# Patient Record
Sex: Female | Born: 1955 | Race: Black or African American | Hispanic: No | Marital: Single | Smoking: Never smoker
Health system: Southern US, Community
[De-identification: ages and names within clinical notes are randomized; demographics above are authoritative.]

## PROBLEM LIST (undated history)

## (undated) DIAGNOSIS — I639 Cerebral infarction, unspecified: Secondary | ICD-10-CM

## (undated) DIAGNOSIS — I10 Essential (primary) hypertension: Secondary | ICD-10-CM

## (undated) DIAGNOSIS — E119 Type 2 diabetes mellitus without complications: Secondary | ICD-10-CM

## (undated) DIAGNOSIS — K219 Gastro-esophageal reflux disease without esophagitis: Secondary | ICD-10-CM

## (undated) DIAGNOSIS — C801 Malignant (primary) neoplasm, unspecified: Secondary | ICD-10-CM

## (undated) HISTORY — PX: BREAST SURGERY: SHX581

---

## 2011-01-28 DIAGNOSIS — C801 Malignant (primary) neoplasm, unspecified: Secondary | ICD-10-CM

## 2011-01-28 HISTORY — DX: Malignant (primary) neoplasm, unspecified: C80.1

## 2012-11-27 ENCOUNTER — Encounter (HOSPITAL_BASED_OUTPATIENT_CLINIC_OR_DEPARTMENT_OTHER): Payer: Self-pay | Admitting: Emergency Medicine

## 2012-11-27 ENCOUNTER — Emergency Department (HOSPITAL_BASED_OUTPATIENT_CLINIC_OR_DEPARTMENT_OTHER): Payer: Commercial Managed Care - PPO

## 2012-11-27 ENCOUNTER — Emergency Department (HOSPITAL_BASED_OUTPATIENT_CLINIC_OR_DEPARTMENT_OTHER)
Admission: EM | Admit: 2012-11-27 | Discharge: 2012-11-28 | Disposition: A | Discharge status: Home / Self Care | Payer: Commercial Managed Care - PPO | Attending: Emergency Medicine | Admitting: Emergency Medicine

## 2012-11-27 DIAGNOSIS — K219 Gastro-esophageal reflux disease without esophagitis: Secondary | ICD-10-CM | POA: Insufficient documentation

## 2012-11-27 DIAGNOSIS — Z853 Personal history of malignant neoplasm of breast: Secondary | ICD-10-CM | POA: Insufficient documentation

## 2012-11-27 DIAGNOSIS — N390 Urinary tract infection, site not specified: Secondary | ICD-10-CM | POA: Diagnosis present

## 2012-11-27 DIAGNOSIS — Z9221 Personal history of antineoplastic chemotherapy: Secondary | ICD-10-CM | POA: Insufficient documentation

## 2012-11-27 DIAGNOSIS — Z923 Personal history of irradiation: Secondary | ICD-10-CM | POA: Insufficient documentation

## 2012-11-27 DIAGNOSIS — R112 Nausea with vomiting, unspecified: Secondary | ICD-10-CM | POA: Insufficient documentation

## 2012-11-27 DIAGNOSIS — R509 Fever, unspecified: Secondary | ICD-10-CM | POA: Diagnosis present

## 2012-11-27 DIAGNOSIS — Z79899 Other long term (current) drug therapy: Secondary | ICD-10-CM | POA: Insufficient documentation

## 2012-11-27 DIAGNOSIS — E860 Dehydration: Secondary | ICD-10-CM | POA: Diagnosis present

## 2012-11-27 DIAGNOSIS — K5900 Constipation, unspecified: Secondary | ICD-10-CM | POA: Insufficient documentation

## 2012-11-27 DIAGNOSIS — J029 Acute pharyngitis, unspecified: Secondary | ICD-10-CM | POA: Insufficient documentation

## 2012-11-27 DIAGNOSIS — I10 Essential (primary) hypertension: Secondary | ICD-10-CM | POA: Insufficient documentation

## 2012-11-27 HISTORY — DX: Essential (primary) hypertension: I10

## 2012-11-27 HISTORY — DX: Malignant (primary) neoplasm, unspecified: C80.1

## 2012-11-27 HISTORY — DX: Gastro-esophageal reflux disease without esophagitis: K21.9

## 2012-11-27 LAB — URINALYSIS W MICROSCOPIC + REFLEX CULTURE
Nitrite: NEGATIVE
Specific Gravity, Urine: 1.041 — ABNORMAL HIGH (ref 1.005–1.030)
Urobilinogen, UA: 1 mg/dL (ref 0.0–1.0)

## 2012-11-27 LAB — COMPREHENSIVE METABOLIC PANEL
BUN: 15 mg/dL (ref 6–23)
Calcium: 10.2 mg/dL (ref 8.4–10.5)
Creatinine, Ser: 1.1 mg/dL (ref 0.50–1.10)
GFR calc Af Amer: 64 mL/min — ABNORMAL LOW (ref >90)
Glucose, Bld: 118 mg/dL — ABNORMAL HIGH (ref 70–99)
Sodium: 135 mEq/L (ref 135–145)
Total Protein: 8.4 g/dL — ABNORMAL HIGH (ref 6.0–8.3)

## 2012-11-27 LAB — CBC WITH DIFFERENTIAL/PLATELET
Eosinophils Absolute: 0 10*3/uL (ref 0.0–0.7)
Eosinophils Relative: 1 % (ref 0–5)
Lymphocytes Relative: 32 % (ref 12–46)
Lymphs Abs: 1.6 10*3/uL (ref 0.7–4.0)
MCH: 30.6 pg (ref 26.0–34.0)
MCV: 90.2 fL (ref 78.0–100.0)
Monocytes Relative: 27 % — ABNORMAL HIGH (ref 3–12)
Neutrophils Relative %: 40 % — ABNORMAL LOW (ref 43–77)
Platelets: 208 10*3/uL (ref 150–400)
RBC: 4.08 MIL/uL (ref 3.87–5.11)

## 2012-11-27 LAB — CG4 I-STAT (LACTIC ACID): Lactic Acid, Venous: 0.65 mmol/L (ref 0.5–2.2)

## 2012-11-27 LAB — LIPASE, BLOOD: Lipase: 25 U/L (ref 11–59)
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
## 2012-11-27 MED ORDER — SODIUM CHLORIDE 0.9 % IV BOLUS (SEPSIS)
1000.0000 mL | INTRAVENOUS | Status: AC
  Administered 2012-11-27: 1000 mL via INTRAVENOUS

## 2012-11-27 MED ORDER — ACETAMINOPHEN 325 MG PO TABS
650.0000 mg | ORAL_TABLET | Freq: Once | ORAL | Status: AC
  Administered 2012-11-27: 650 mg via ORAL
  Filled 2012-11-27: qty 2

## 2012-11-27 MED ORDER — CEPHALEXIN 500 MG PO CAPS: 500.0000 mg | ORAL_CAPSULE | Freq: Four times a day (QID) | ORAL | Status: DC

## 2012-11-27 MED ORDER — ONDANSETRON HCL 4 MG/2ML IJ SOLN
4.0000 mg | Freq: Once | INTRAMUSCULAR | Status: AC
  Administered 2012-11-27: 4 mg via INTRAVENOUS
  Filled 2012-11-27: qty 2

## 2012-11-27 MED ORDER — ACETAMINOPHEN 325 MG PO TABS
ORAL_TABLET | ORAL | Status: AC
  Filled 2012-11-27: qty 1

## 2012-11-27 MED ORDER — IOHEXOL 300 MG/ML  SOLN
50.0000 mL | Freq: Once | INTRAMUSCULAR | Status: AC | PRN
  Administered 2012-11-27: 50 mL via ORAL

## 2012-11-27 MED ORDER — IOHEXOL 300 MG/ML  SOLN
100.0000 mL | Freq: Once | INTRAMUSCULAR | Status: AC | PRN
  Administered 2012-11-27: 100 mL via INTRAVENOUS

## 2012-11-27 MED ORDER — CEPHALEXIN 250 MG PO CAPS
500.0000 mg | ORAL_CAPSULE | Freq: Once | ORAL | Status: AC
  Administered 2012-11-28: 500 mg via ORAL
  Filled 2012-11-27: qty 2

## 2012-11-27 NOTE — ED Notes (Signed)
Patient here after radiation treatment on Monday for breast cancer. Patient reports nausea and increased weakness with fatigue. Reports some abdominal discomfort and back pain. Patient alert and oriented. Was last radiation treatment
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
## 2012-11-27 NOTE — ED Notes (Signed)
LAC drawn and results hand delivered to Dr Romeo Apple.

## 2012-11-30 LAB — URINE CULTURE

## 2015-02-08 ENCOUNTER — Encounter (HOSPITAL_BASED_OUTPATIENT_CLINIC_OR_DEPARTMENT_OTHER): Payer: Self-pay | Admitting: *Deleted

## 2015-02-08 ENCOUNTER — Emergency Department (HOSPITAL_BASED_OUTPATIENT_CLINIC_OR_DEPARTMENT_OTHER)
Admission: EM | Admit: 2015-02-08 | Discharge: 2015-02-08 | Disposition: A | Discharge status: Home / Self Care | Payer: Commercial Managed Care - PPO | Attending: Emergency Medicine | Admitting: Emergency Medicine

## 2015-02-08 ENCOUNTER — Emergency Department (HOSPITAL_BASED_OUTPATIENT_CLINIC_OR_DEPARTMENT_OTHER): Payer: Commercial Managed Care - PPO

## 2015-02-08 DIAGNOSIS — Z859 Personal history of malignant neoplasm, unspecified: Secondary | ICD-10-CM | POA: Insufficient documentation

## 2015-02-08 DIAGNOSIS — I10 Essential (primary) hypertension: Secondary | ICD-10-CM | POA: Diagnosis not present

## 2015-02-08 DIAGNOSIS — K219 Gastro-esophageal reflux disease without esophagitis: Secondary | ICD-10-CM | POA: Diagnosis not present

## 2015-02-08 DIAGNOSIS — J189 Pneumonia, unspecified organism: Secondary | ICD-10-CM

## 2015-02-08 DIAGNOSIS — J159 Unspecified bacterial pneumonia: Secondary | ICD-10-CM | POA: Diagnosis not present

## 2015-02-08 DIAGNOSIS — R079 Chest pain, unspecified: Secondary | ICD-10-CM | POA: Diagnosis present

## 2015-02-08 DIAGNOSIS — Z79899 Other long term (current) drug therapy: Secondary | ICD-10-CM | POA: Diagnosis not present
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
## 2015-02-08 MED ORDER — AZITHROMYCIN 250 MG PO TABS
500.0000 mg | ORAL_TABLET | Freq: Once | ORAL | Status: AC
  Administered 2015-02-08: 500 mg via ORAL
  Filled 2015-02-08: qty 2

## 2015-02-08 MED ORDER — AZITHROMYCIN 250 MG PO TABS: ORAL_TABLET | ORAL | Status: DC

## 2015-02-08 MED ORDER — HYDROCODONE-HOMATROPINE 5-1.5 MG/5ML PO SYRP: 5.0000 mL | ORAL_SOLUTION | Freq: Four times a day (QID) | ORAL | Status: DC | PRN

## 2015-02-08 MED ORDER — CEFTRIAXONE SODIUM 1 G IJ SOLR
1.0000 g | INTRAMUSCULAR | Status: DC
  Administered 2015-02-08: 1 g via INTRAMUSCULAR
  Filled 2015-02-08: qty 10

## 2015-02-08 MED ORDER — LIDOCAINE HCL (PF) 2 % IJ SOLN
INTRAMUSCULAR | Status: AC
  Administered 2015-02-08: 5 mL
  Filled 2015-02-08: qty 2

## 2015-02-08 NOTE — ED Notes (Signed)
Pt on heart monitor 
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
## 2015-02-08 NOTE — ED Notes (Signed)
Chest tightness x 2 days. Cough, chills and wheezing.

## 2015-02-08 NOTE — Discharge Instructions (Signed)

## 2019-03-16 ENCOUNTER — Emergency Department (HOSPITAL_BASED_OUTPATIENT_CLINIC_OR_DEPARTMENT_OTHER)
Admission: EM | Admit: 2019-03-16 | Discharge: 2019-03-16 | Disposition: A | Discharge status: Home / Self Care | Payer: PRIVATE HEALTH INSURANCE | Attending: Emergency Medicine | Admitting: Emergency Medicine

## 2019-03-16 ENCOUNTER — Encounter (HOSPITAL_BASED_OUTPATIENT_CLINIC_OR_DEPARTMENT_OTHER): Payer: Self-pay

## 2019-03-16 ENCOUNTER — Other Ambulatory Visit: Payer: Self-pay

## 2019-03-16 DIAGNOSIS — Z8673 Personal history of transient ischemic attack (TIA), and cerebral infarction without residual deficits: Secondary | ICD-10-CM | POA: Insufficient documentation

## 2019-03-16 DIAGNOSIS — U071 COVID-19: Secondary | ICD-10-CM | POA: Diagnosis not present

## 2019-03-16 DIAGNOSIS — Z886 Allergy status to analgesic agent status: Secondary | ICD-10-CM | POA: Diagnosis not present

## 2019-03-16 DIAGNOSIS — Z79899 Other long term (current) drug therapy: Secondary | ICD-10-CM | POA: Insufficient documentation

## 2019-03-16 DIAGNOSIS — I10 Essential (primary) hypertension: Secondary | ICD-10-CM | POA: Insufficient documentation

## 2019-03-16 DIAGNOSIS — M7918 Myalgia, other site: Secondary | ICD-10-CM | POA: Diagnosis present

## 2019-03-16 HISTORY — DX: Cerebral infarction, unspecified: I63.9

## 2019-03-16 LAB — CBC WITH DIFFERENTIAL/PLATELET
Abs Immature Granulocytes: 0.01 10*3/uL (ref 0.00–0.07)
Basophils Absolute: 0 10*3/uL (ref 0.0–0.1)
Basophils Relative: 0 %
Eosinophils Absolute: 0 10*3/uL (ref 0.0–0.5)
Eosinophils Relative: 0 %
HCT: 40.2 % (ref 36.0–46.0)
Hemoglobin: 12.9 g/dL (ref 12.0–15.0)
Immature Granulocytes: 0 %
Lymphocytes Relative: 46 %
Lymphs Abs: 1.9 10*3/uL (ref 0.7–4.0)
MCH: 27.4 pg (ref 26.0–34.0)
MCHC: 32.1 g/dL (ref 30.0–36.0)
MCV: 85.5 fL (ref 80.0–100.0)
Monocytes Absolute: 0.9 10*3/uL (ref 0.1–1.0)
Monocytes Relative: 21 %
Neutro Abs: 1.4 10*3/uL — ABNORMAL LOW (ref 1.7–7.7)
Neutrophils Relative %: 33 %
Platelets: 180 10*3/uL (ref 150–400)
RBC: 4.7 MIL/uL (ref 3.87–5.11)
RDW: 15.6 % — ABNORMAL HIGH (ref 11.5–15.5)
WBC: 4.2 10*3/uL (ref 4.0–10.5)
nRBC: 0 % (ref 0.0–0.2)

## 2019-03-16 LAB — COMPREHENSIVE METABOLIC PANEL
ALT: 26 U/L (ref 0–44)
AST: 38 U/L (ref 15–41)
Albumin: 3.1 g/dL — ABNORMAL LOW (ref 3.5–5.0)
Alkaline Phosphatase: 67 U/L (ref 38–126)
Anion gap: 8 (ref 5–15)
BUN: 12 mg/dL (ref 8–23)
CO2: 23 mmol/L (ref 22–32)
Calcium: 8 mg/dL — ABNORMAL LOW (ref 8.9–10.3)
Chloride: 107 mmol/L (ref 98–111)
Creatinine, Ser: 0.89 mg/dL (ref 0.44–1.00)
GFR calc Af Amer: >60 mL/min (ref >60)
GFR calc non Af Amer: >60 mL/min (ref >60)
Glucose, Bld: 75 mg/dL (ref 70–99)
Potassium: 3.8 mmol/L (ref 3.5–5.1)
Sodium: 138 mmol/L (ref 135–145)
Total Bilirubin: 0.5 mg/dL (ref 0.3–1.2)
Total Protein: 6.7 g/dL (ref 6.5–8.1)

## 2019-03-16 LAB — LIPASE, BLOOD: Lipase: 28 U/L (ref 11–51)

## 2019-03-16 MED ORDER — SODIUM CHLORIDE 0.9 % IV BOLUS (SEPSIS)
1000.0000 mL | Freq: Once | INTRAVENOUS | Status: AC
  Administered 2019-03-16: 14:00:00 1000 mL via INTRAVENOUS

## 2019-03-16 MED ORDER — ONDANSETRON HCL 4 MG PO TABS: 4.0000 mg | ORAL_TABLET | Freq: Four times a day (QID) | ORAL | 0 refills | Status: DC

## 2019-03-16 MED ORDER — ONDANSETRON HCL 4 MG/2ML IJ SOLN
4.0000 mg | Freq: Once | INTRAMUSCULAR | Status: AC
  Administered 2019-03-16: 14:00:00 4 mg via INTRAVENOUS
  Filled 2019-03-16: qty 2

## 2019-03-16 NOTE — Discharge Instructions (Signed)
Thank you for allowing me to care for you today. Please return to the emergency department if you have new or worsening symptoms. Take your medications as instructed.  ° °

## 2019-03-16 NOTE — ED Triage Notes (Signed)
Pt c/o body aches,chills, diarrhea, fever x 3 days with +covid test 2 days ago-NAD-slow gait

## 2019-03-16 NOTE — ED Provider Notes (Signed)
[facility] Provider Note   CSN: [id number] Arrival date & time: 03/16/19  1229     History Chief Complaint  Patient presents with  . Generalized Body Aches    +Covid    Deborah [name] is a 64 y.o. female.  64 year old Covid positive patients with symptoms since last Sunday with past medical history of hypertension, GERD, stroke presenting to the emergency department for worsening symptoms.  Patient reports that she has had diarrhea, nausea, vomiting and stomach upset with fever to 102.  Reports that she took Tylenol prior to arrival.  Reports she called her primary care doctor who stated she may to be dehydrated and needs fluids.  She denies any chest pain, shortness of breath.        Past Medical History:  Diagnosis Date  . Cancer ([place])   . GERD (gastroesophageal reflux disease)   . Hypertension   . Stroke [facility])     Patient Active Problem List   Diagnosis Date Noted  . Fever 11/27/2012  . Dehydration 11/27/2012  . UTI (lower urinary tract infection) 11/27/2012    Past Surgical History:  Procedure Laterality Date  . BREAST SURGERY       OB History   No obstetric history on file.     No family history on file.  Social History   Tobacco Use  . Smoking status: Never Smoker  . Smokeless tobacco: Never Used  Substance Use Topics  . Alcohol use: No  . Drug use: No    Home Medications Prior to Admission medications   Medication Sig Start Date End Date Taking? Authorizing Provider  azithromycin (ZITHROMAX) 250 MG tablet Take 1 pill daily until gone 02/08/15   Leonard Schwartz, MD  hydrochlorothiazide (HYDRODIURIL) 25 MG tablet Take 25 mg by mouth daily.    [provider]  HYDROcodone-homatropine (HYCODAN) 5-1.5 MG/5ML syrup Take 5 mLs by mouth every 6 (six) hours as needed for cough. 02/08/15   Leonard Schwartz, MD  LORazepam (ATIVAN) 1 MG tablet Take 1 mg by mouth every 8 (eight) hours.    [provider]   metoprolol succinate (TOPROL-XL) 25 MG 24 hr tablet Take 25 mg by mouth daily.    [provider]  pantoprazole (PROTONIX) 40 MG tablet Take 40 mg by mouth daily.    [provider]    Allergies    Aleve [naproxen] and Ibuprofen  Review of Systems   Review of Systems  Constitutional: Positive for appetite change, chills, fatigue and fever.  HENT: Positive for congestion. Negative for rhinorrhea and sore throat.   Respiratory: Negative for cough and shortness of breath.   Cardiovascular: Negative for chest pain.  Gastrointestinal: Positive for abdominal pain, diarrhea, nausea and vomiting. Negative for constipation and rectal pain.  Genitourinary: Negative for decreased urine volume, dysuria, frequency and vaginal discharge.  Musculoskeletal: Negative for arthralgias and back pain.  Skin: Negative for rash and wound.  Neurological: Negative for dizziness, light-headedness and headaches.    Physical Exam Updated Vital Signs BP (!) 135/91 (BP Location: Right Arm)   Pulse 77   Temp 98.8 F (37.1 C) (Oral)   Resp 16   Ht 5\' 1"  (1.549 m)   Wt 85.7 kg   SpO2 100%   BMI 35.71 kg/m   Physical Exam Vitals and nursing note reviewed.  Constitutional:      General: She is not in acute distress.    Appearance: Normal appearance. She is not ill-appearing, toxic-appearing or diaphoretic.  HENT:     Head: Normocephalic.     Mouth/Throat:     Mouth: Mucous membranes are moist.  Eyes:     Conjunctiva/sclera: Conjunctivae normal.  Cardiovascular:     Rate and Rhythm: Normal rate and regular rhythm.     Heart sounds: Normal heart sounds.  Pulmonary:     Effort: Pulmonary effort is normal.     Breath sounds: Normal breath sounds. No wheezing.  Abdominal:     General: Abdomen is flat.     Palpations: Abdomen is soft.     Tenderness: There is no abdominal tenderness.  Skin:    General: Skin is dry.  Neurological:     Mental Status: She is alert.  Psychiatric:         Mood and Affect: Mood normal.     ED Results / Procedures / Treatments   Labs (all labs ordered are listed, but only abnormal results are displayed) Labs Reviewed  CBC WITH DIFFERENTIAL/PLATELET  COMPREHENSIVE METABOLIC PANEL  LIPASE, BLOOD  URINALYSIS, ROUTINE W REFLEX MICROSCOPIC    EKG None  Radiology No results found.  Procedures Procedures (including critical care time)  Medications Ordered in ED Medications  sodium chloride 0.9 % bolus 1,000 mL (1,000 mLs Intravenous New Bag/Given 03/16/19 1425)  ondansetron (ZOFRAN) injection 4 mg (4 mg Intravenous Given 03/16/19 1425)    ED Course  I have reviewed the triage vital signs and the nursing notes.  Pertinent labs & imaging results that were available during my care of the patient were reviewed by me and considered in my medical decision making (see chart for details).  Clinical Course as of Mar 15 1646  Wed Mar 16, 2019  1620 Patient covid + presenting with n/v/d for 4 days. Denies SOB or respiratory symptoms. Appears stable on exam. Workup reassuring. Treated with zofran and fluids. Advised on return precautions.    [KM]    Clinical Course User Index [KM] Kristine Royal   MDM Rules/Calculators/A&P                      Based on review of vitals, medical screening exam, lab work and/or imaging, there does not appear to be an acute, emergent etiology for the patient's symptoms. Counseled pt on good return precautions and encouraged both PCP and ED follow-up as needed.  Prior to discharge, I also discussed incidental imaging findings with patient in detail and advised appropriate, recommended follow-up in detail.  Clinical Impression: 1. COVID-19     Disposition: Discharge  Prior to providing a prescription for a controlled substance, I independently reviewed the patient's recent prescription history on the [place]. The patient had no recent or regular  prescriptions and was deemed appropriate for a brief, less than 3 day prescription of narcotic for acute analgesia.  This note was prepared with assistance of Systems analyst. Occasional wrong-word or sound-a-like substitutions may have occurred due to the inherent limitations of voice recognition software.  Final Clinical Impression(s) / ED Diagnoses Final diagnoses:  None    Rx / DC Orders ED Discharge Orders    None       Kristine Royal 03/16/19 1648    Virgel Manifold, MD 03/17/19 1115

## 2020-03-20 ENCOUNTER — Inpatient Hospital Stay (HOSPITAL_COMMUNITY): Payer: PRIVATE HEALTH INSURANCE

## 2020-03-20 ENCOUNTER — Inpatient Hospital Stay (HOSPITAL_COMMUNITY)
Admission: EM | Admit: 2020-03-20 | Discharge: 2020-03-28 | DRG: 091 | Disposition: A | Discharge status: Rehabilitation Facility | Payer: PRIVATE HEALTH INSURANCE | Attending: Neurology | Admitting: Neurology

## 2020-03-20 ENCOUNTER — Other Ambulatory Visit: Payer: Self-pay

## 2020-03-20 ENCOUNTER — Encounter (HOSPITAL_COMMUNITY): Payer: Self-pay | Admitting: Emergency Medicine

## 2020-03-20 ENCOUNTER — Emergency Department (HOSPITAL_COMMUNITY): Payer: PRIVATE HEALTH INSURANCE

## 2020-03-20 DIAGNOSIS — Z7984 Long term (current) use of oral hypoglycemic drugs: Secondary | ICD-10-CM

## 2020-03-20 DIAGNOSIS — Z6837 Body mass index (BMI) 37.0-37.9, adult: Secondary | ICD-10-CM

## 2020-03-20 DIAGNOSIS — Z8249 Family history of ischemic heart disease and other diseases of the circulatory system: Secondary | ICD-10-CM | POA: Diagnosis not present

## 2020-03-20 DIAGNOSIS — G9511 Acute infarction of spinal cord (embolic) (nonembolic): Principal | ICD-10-CM | POA: Diagnosis present

## 2020-03-20 DIAGNOSIS — Z79899 Other long term (current) drug therapy: Secondary | ICD-10-CM

## 2020-03-20 DIAGNOSIS — I6502 Occlusion and stenosis of left vertebral artery: Secondary | ICD-10-CM | POA: Diagnosis present

## 2020-03-20 DIAGNOSIS — E785 Hyperlipidemia, unspecified: Secondary | ICD-10-CM | POA: Diagnosis present

## 2020-03-20 DIAGNOSIS — E7800 Pure hypercholesterolemia, unspecified: Secondary | ICD-10-CM | POA: Diagnosis not present

## 2020-03-20 DIAGNOSIS — I6509 Occlusion and stenosis of unspecified vertebral artery: Secondary | ICD-10-CM | POA: Diagnosis present

## 2020-03-20 DIAGNOSIS — Z91013 Allergy to seafood: Secondary | ICD-10-CM | POA: Diagnosis not present

## 2020-03-20 DIAGNOSIS — K219 Gastro-esophageal reflux disease without esophagitis: Secondary | ICD-10-CM | POA: Diagnosis present

## 2020-03-20 DIAGNOSIS — I7774 Dissection of vertebral artery: Principal | ICD-10-CM | POA: Diagnosis present

## 2020-03-20 DIAGNOSIS — I6389 Other cerebral infarction: Secondary | ICD-10-CM

## 2020-03-20 DIAGNOSIS — Z20822 Contact with and (suspected) exposure to covid-19: Secondary | ICD-10-CM | POA: Diagnosis present

## 2020-03-20 DIAGNOSIS — R001 Bradycardia, unspecified: Secondary | ICD-10-CM | POA: Diagnosis present

## 2020-03-20 DIAGNOSIS — E1165 Type 2 diabetes mellitus with hyperglycemia: Secondary | ICD-10-CM | POA: Diagnosis present

## 2020-03-20 DIAGNOSIS — I160 Hypertensive urgency: Secondary | ICD-10-CM | POA: Diagnosis not present

## 2020-03-20 DIAGNOSIS — E876 Hypokalemia: Secondary | ICD-10-CM | POA: Diagnosis present

## 2020-03-20 DIAGNOSIS — Z8673 Personal history of transient ischemic attack (TIA), and cerebral infarction without residual deficits: Secondary | ICD-10-CM

## 2020-03-20 DIAGNOSIS — G8250 Quadriplegia, unspecified: Secondary | ICD-10-CM | POA: Diagnosis present

## 2020-03-20 DIAGNOSIS — Z888 Allergy status to other drugs, medicaments and biological substances status: Secondary | ICD-10-CM | POA: Diagnosis not present

## 2020-03-20 DIAGNOSIS — E669 Obesity, unspecified: Secondary | ICD-10-CM | POA: Diagnosis present

## 2020-03-20 DIAGNOSIS — I10 Essential (primary) hypertension: Secondary | ICD-10-CM | POA: Diagnosis present

## 2020-03-20 DIAGNOSIS — Z881 Allergy status to other antibiotic agents status: Secondary | ICD-10-CM

## 2020-03-20 DIAGNOSIS — Z886 Allergy status to analgesic agent status: Secondary | ICD-10-CM | POA: Diagnosis not present

## 2020-03-20 DIAGNOSIS — I6521 Occlusion and stenosis of right carotid artery: Secondary | ICD-10-CM | POA: Diagnosis present

## 2020-03-20 HISTORY — DX: Type 2 diabetes mellitus without complications: E11.9

## 2020-03-20 LAB — RAPID URINE DRUG SCREEN, HOSP PERFORMED
Amphetamines: NOT DETECTED
Barbiturates: NOT DETECTED
Benzodiazepines: NOT DETECTED
Cocaine: NOT DETECTED
Opiates: NOT DETECTED
Tetrahydrocannabinol: NOT DETECTED

## 2020-03-20 LAB — URINALYSIS, ROUTINE W REFLEX MICROSCOPIC
Bilirubin Urine: NEGATIVE
Glucose, UA: NEGATIVE mg/dL
Hgb urine dipstick: NEGATIVE
Ketones, ur: NEGATIVE mg/dL
Leukocytes,Ua: NEGATIVE
Nitrite: NEGATIVE
Protein, ur: NEGATIVE mg/dL
Specific Gravity, Urine: 1.017 (ref 1.005–1.030)
pH: 8 (ref 5.0–8.0)

## 2020-03-20 LAB — PROTIME-INR
INR: 1 (ref 0.8–1.2)
Prothrombin Time: 12.3 seconds (ref 11.4–15.2)

## 2020-03-20 LAB — HIV ANTIBODY (ROUTINE TESTING W REFLEX): HIV Screen 4th Generation wRfx: NONREACTIVE

## 2020-03-20 LAB — CBC
HCT: 37.6 % (ref 36.0–46.0)
Hemoglobin: 11.7 g/dL — ABNORMAL LOW (ref 12.0–15.0)
MCH: 27.3 pg (ref 26.0–34.0)
MCHC: 31.1 g/dL (ref 30.0–36.0)
MCV: 87.6 fL (ref 80.0–100.0)
Platelets: 201 10*3/uL (ref 150–400)
RBC: 4.29 MIL/uL (ref 3.87–5.11)
RDW: 16.3 % — ABNORMAL HIGH (ref 11.5–15.5)
WBC: 6.8 10*3/uL (ref 4.0–10.5)
nRBC: 0 % (ref 0.0–0.2)

## 2020-03-20 LAB — DIFFERENTIAL
Abs Immature Granulocytes: 0.01 10*3/uL (ref 0.00–0.07)
Basophils Absolute: 0 10*3/uL (ref 0.0–0.1)
Basophils Relative: 0 %
Eosinophils Absolute: 0.1 10*3/uL (ref 0.0–0.5)
Eosinophils Relative: 1 %
Immature Granulocytes: 0 %
Lymphocytes Relative: 43 %
Lymphs Abs: 3 10*3/uL (ref 0.7–4.0)
Monocytes Absolute: 0.9 10*3/uL (ref 0.1–1.0)
Monocytes Relative: 13 %
Neutro Abs: 2.9 10*3/uL (ref 1.7–7.7)
Neutrophils Relative %: 43 %

## 2020-03-20 LAB — COMPREHENSIVE METABOLIC PANEL
ALT: 19 U/L (ref 0–44)
AST: 22 U/L (ref 15–41)
Albumin: 3.5 g/dL (ref 3.5–5.0)
Alkaline Phosphatase: 82 U/L (ref 38–126)
Anion gap: 10 (ref 5–15)
BUN: 7 mg/dL — ABNORMAL LOW (ref 8–23)
CO2: 23 mmol/L (ref 22–32)
Calcium: 8.8 mg/dL — ABNORMAL LOW (ref 8.9–10.3)
Chloride: 104 mmol/L (ref 98–111)
Creatinine, Ser: 0.81 mg/dL (ref 0.44–1.00)
GFR, Estimated: >60 mL/min (ref >60)
Glucose, Bld: 144 mg/dL — ABNORMAL HIGH (ref 70–99)
Potassium: 3.3 mmol/L — ABNORMAL LOW (ref 3.5–5.1)
Sodium: 137 mmol/L (ref 135–145)
Total Bilirubin: 0.6 mg/dL (ref 0.3–1.2)
Total Protein: 7.3 g/dL (ref 6.5–8.1)

## 2020-03-20 LAB — I-STAT CHEM 8, ED
BUN: 8 mg/dL (ref 8–23)
Calcium, Ion: 1.06 mmol/L — ABNORMAL LOW (ref 1.15–1.40)
Chloride: 104 mmol/L (ref 98–111)
Creatinine, Ser: 0.7 mg/dL (ref 0.44–1.00)
Glucose, Bld: 143 mg/dL — ABNORMAL HIGH (ref 70–99)
HCT: 37 % (ref 36.0–46.0)
Hemoglobin: 12.6 g/dL (ref 12.0–15.0)
Potassium: 3.3 mmol/L — ABNORMAL LOW (ref 3.5–5.1)
Sodium: 141 mmol/L (ref 135–145)
TCO2: 24 mmol/L (ref 22–32)

## 2020-03-20 LAB — ETHANOL: Alcohol, Ethyl (B): <10 mg/dL (ref <10)

## 2020-03-20 LAB — ECHOCARDIOGRAM COMPLETE
Area-P 1/2: 3.85 cm2
Height: 61 in
MV M vel: 4.15 m/s
MV Peak grad: 68.9 mmHg
S' Lateral: 2.4 cm
Weight: 3136 oz

## 2020-03-20 LAB — LIPID PANEL
Cholesterol: 222 mg/dL — ABNORMAL HIGH (ref 0–200)
HDL: 52 mg/dL (ref >40)
LDL Cholesterol: 156 mg/dL — ABNORMAL HIGH (ref 0–99)
Total CHOL/HDL Ratio: 4.3 RATIO
Triglycerides: 72 mg/dL (ref <150)
VLDL: 14 mg/dL (ref 0–40)

## 2020-03-20 LAB — CK: Total CK: 84 U/L (ref 38–234)

## 2020-03-20 LAB — CBG MONITORING, ED: Glucose-Capillary: 123 mg/dL — ABNORMAL HIGH (ref 70–99)

## 2020-03-20 LAB — RESP PANEL BY RT-PCR (FLU A&B, COVID) ARPGX2
Influenza A by PCR: NEGATIVE
Influenza B by PCR: NEGATIVE
SARS Coronavirus 2 by RT PCR: NEGATIVE

## 2020-03-20 LAB — HEMOGLOBIN A1C
Hgb A1c MFr Bld: 6.5 % — ABNORMAL HIGH (ref 4.8–5.6)
Mean Plasma Glucose: 139.85 mg/dL

## 2020-03-20 LAB — GLUCOSE, CAPILLARY: Glucose-Capillary: 138 mg/dL — ABNORMAL HIGH (ref 70–99)

## 2020-03-20 LAB — APTT: aPTT: 25 seconds (ref 24–36)

## 2020-03-20 LAB — MRSA PCR SCREENING: MRSA by PCR: NEGATIVE

## 2020-03-20 IMAGING — MR MR HEAD W/O CM
16 of 18 series · 39 of 48 positions shown · non-contrast
Comparison: Head CT, CTA head and neck earlier today.

[REDACTED] [HOSPITAL] [HOSPITAL] brain MRI
[DATE]. CT cervical spine [DATE].

CLINICAL DATA: 65-year-old female status post code stroke
presentation this morning. Extremity weakness.

EXAM:
MRI HEAD WITHOUT CONTRAST
MRI CERVICAL SPINE WITHOUT CONTRAST
TECHNIQUE: Multiplanar, multiecho pulse sequences of the brain and surrounding
structures, and cervical spine, to include the craniocervical
junction and cervicothoracic junction, were obtained without
intravenous contrast.

[Series 5: DWI · axial · 3.0mm · 0.88mm/px · z∈[-118,+22]mm · 6 of 96 slices shown (1 of 4)]
[im 1/96]
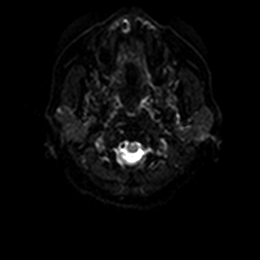
[im 20/96]
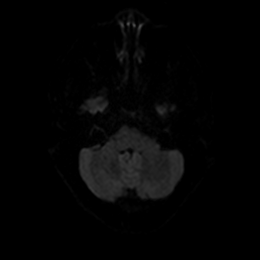
[im 39/96]
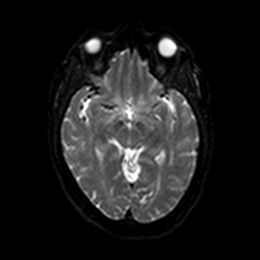
[im 58/96]
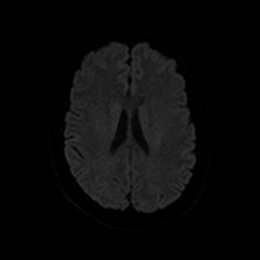
[im 77/96]
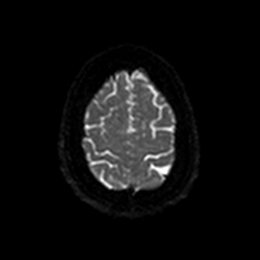
[im 96/96]
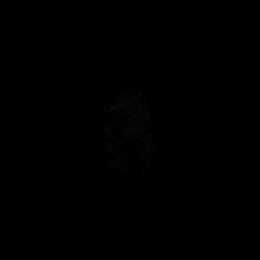

[Series 6: DWI · axial · 3.0mm · 0.88mm/px · z∈[-118,+22]mm · 3 of 48 slices shown (2 of 4)]
[im 1/48]
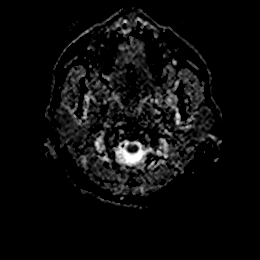
[im 24/48]
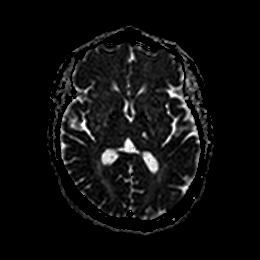
[im 48/48]
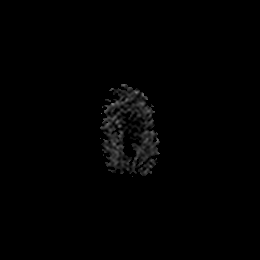

[Series 7: DWI · coronal · 4.0mm · 0.88mm/px · 4 of 64 slices shown (3 of 4)]
[im 1/64]
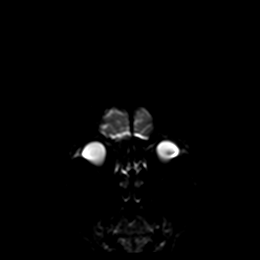
[im 22/64]
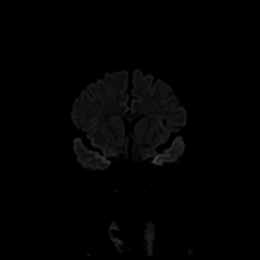
[im 43/64]
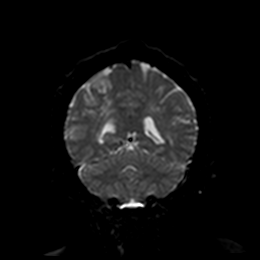
[im 64/64]
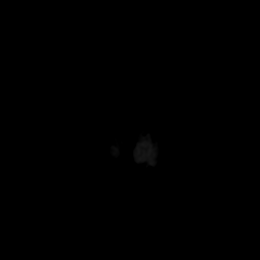

[Series 8: DWI · coronal · 4.0mm · 0.88mm/px · 2 of 32 slices shown (4 of 4)]
[im 1/32]
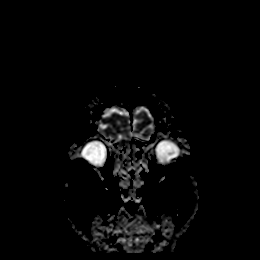
[im 32/32]
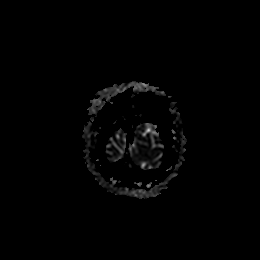

[Series 9: T1 · sagittal · 5.0mm · 0.75mm/px · 1 of 23 slices shown (1 of 2)]
[im 1/23]
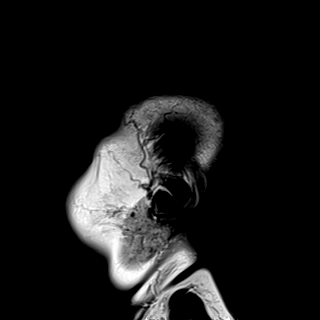

[Series 10: T2 · axial · 5.0mm · 0.72mm/px · z∈[-120,+24]mm · 2 of 25 slices shown (1 of 4)]
[im 1/25]
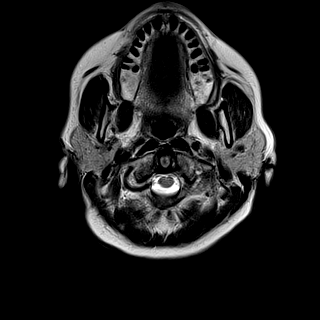
[im 25/25]
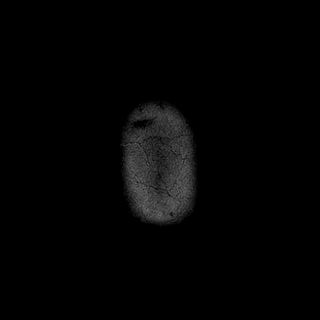

[Series 11: FLAIR · axial · 5.0mm · 0.45mm/px · z∈[-120,+24]mm · 2 of 25 slices shown]
[im 1/25]
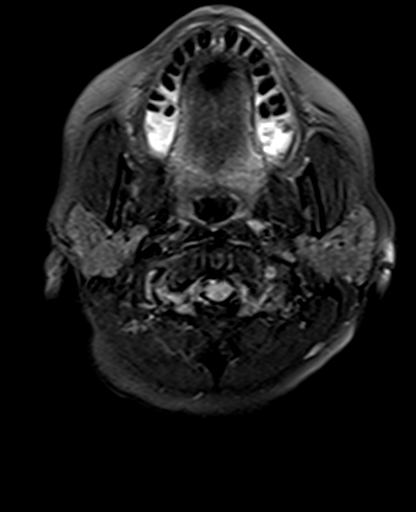
[im 25/25]
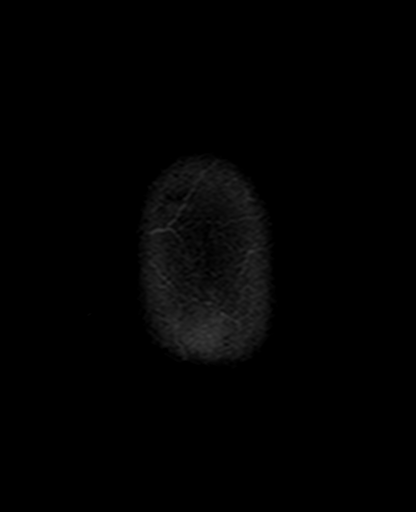

[Series 12: mag_images · axial · 3.0mm · 0.90mm/px · z∈[-136,+40]mm · 4 of 60 slices shown]
[im 1/60]
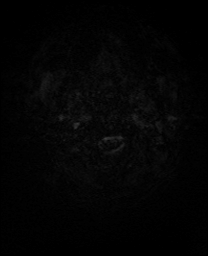
[im 20/60]
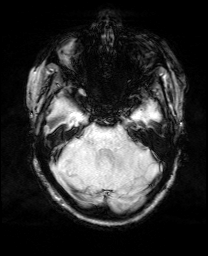
[im 40/60]
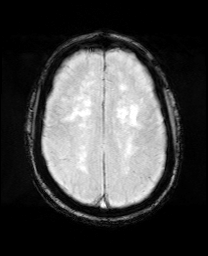
[im 60/60]
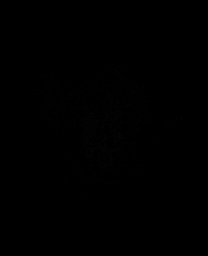

[Series 13: pha_images · axial · 3.0mm · 0.90mm/px · z∈[-136,+31]mm · 4 of 57 slices shown]
[im 1/57]
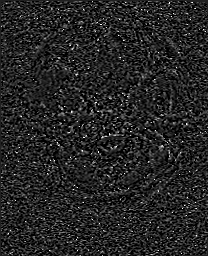
[im 19/57]
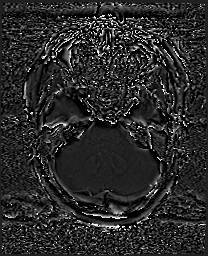
[im 38/57]
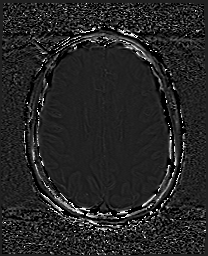
[im 57/57]
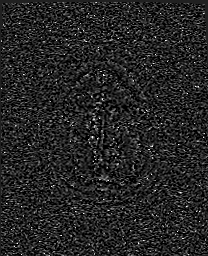

[Series 14: swi_images · axial · 3.0mm · 0.90mm/px · z∈[-136,-79]mm · 2 of 60 slices shown]
[im 1/60]
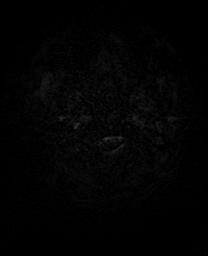
[im 20/60]
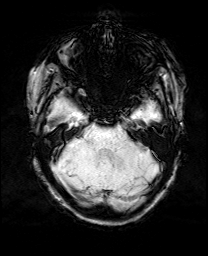

[Series 17: T2 · coronal · 5.0mm · 0.34mm/px · 2 of 29 slices shown (2 of 4)]
[im 1/29]
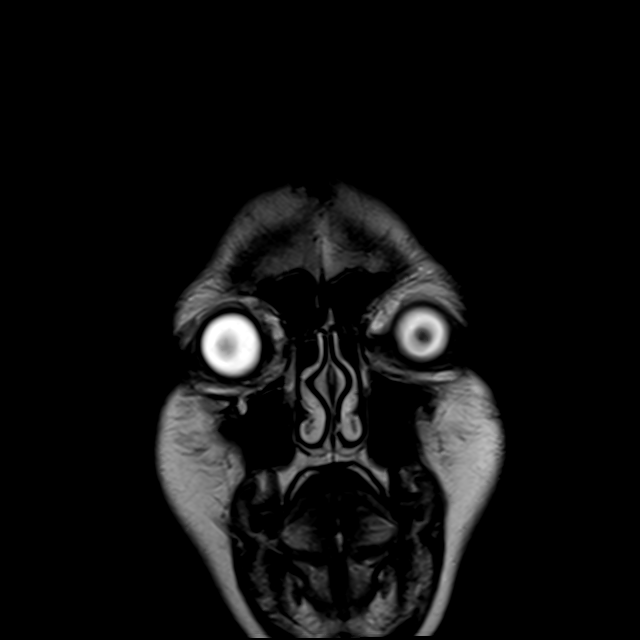
[im 29/29]
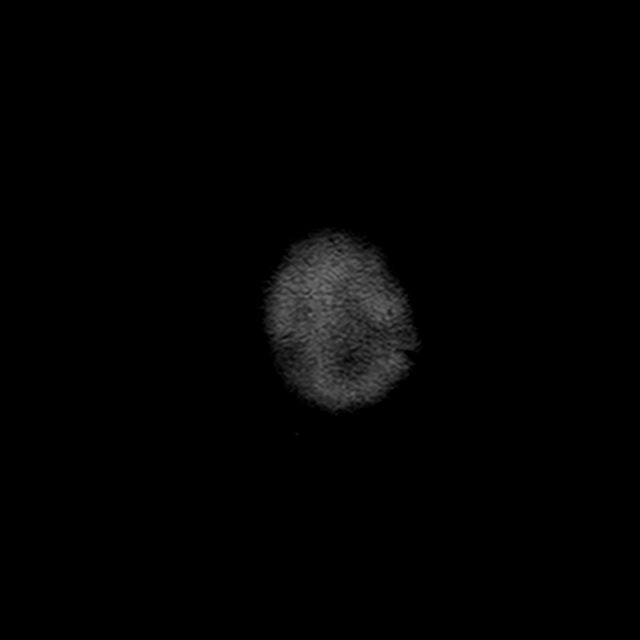

[Series 23: T1 · sagittal · 3.0mm · 0.69mm/px · 1 of 15 slices shown (2 of 2)]
[im 1/15]
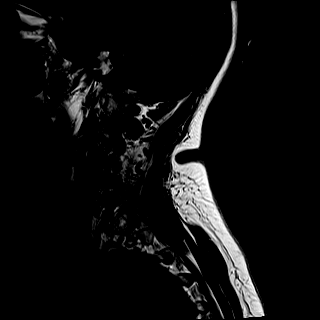

[Series 24: T2 · sagittal · 3.0mm · 0.69mm/px · 1 of 15 slices shown (3 of 4)]
[im 1/15]
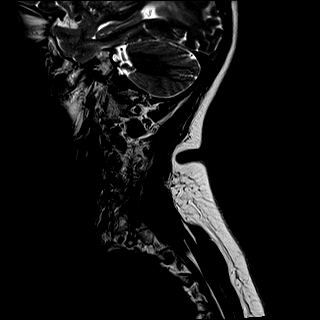

[Series 25: STIR · sagittal · 3.0mm · 0.86mm/px · 1 of 15 slices shown]
[im 1/15]
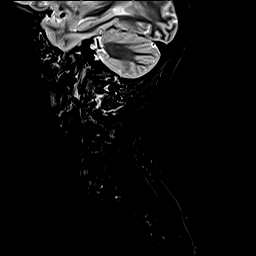

[Series 26: T2 · axial · 3.0mm · 0.66mm/px · z∈[-262,-165]mm · 2 of 34 slices shown (4 of 4)]
[im 1/34]
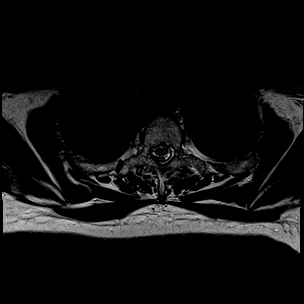
[im 34/34]
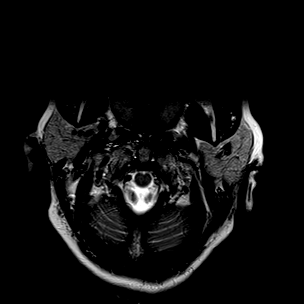

[Series 27: GRE · axial · 3.0mm · 0.39mm/px · z∈[-262,-165]mm · 2 of 34 slices shown]
[im 1/34]
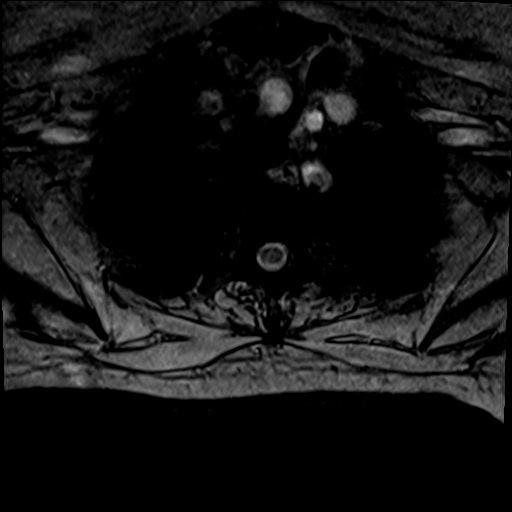
[im 34/34]
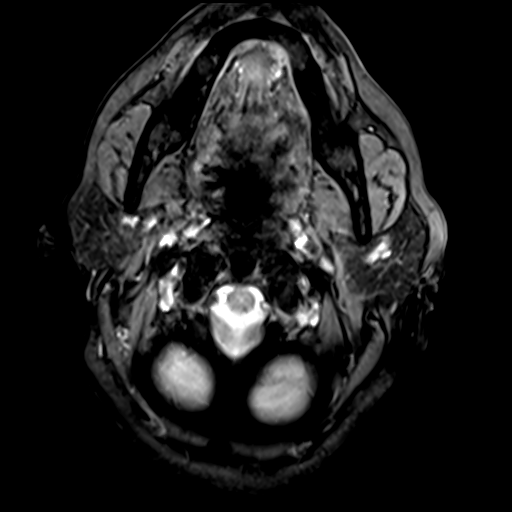

[39 of 48 positions shown; findings below may reference images not displayed]

FINDINGS: MRI HEAD FINDINGS

Brain: No restricted diffusion or evidence of acute infarction.

Advanced bilateral cerebral white matter T2 and FLAIR hyperintensity
which is Patchy and confluent in both hemispheres. Bilateral deep
white matter capsule involvement. Superimposed chronic lacunar
infarcts in the thalami, larger on the left (series 10, image 14).
T2 and FLAIR heterogeneity throughout the bilateral basal ganglia,
and in the pons. Numerous chronic microhemorrhages in the deep gray
nuclei, especially the thalami. Scattered hemispheric and left
cerebellar chronic microhemorrhage elsewhere. Stable SWI since
[REDACTED].

No definite cortical encephalomalacia. No midline shift, mass
effect, evidence of mass lesion, ventriculomegaly, extra-axial
collection or acute intracranial hemorrhage. Cervicomedullary
junction and pituitary are within normal limits.

Vascular: Major intracranial vascular flow voids are stable since
[REDACTED], preserved.

Skull and upper cervical spine: Cervical spine detailed below.
Visualized bone marrow signal is within normal limits.

Sinuses/Orbits: Stable, and negative aside from chronic right
orbital floor fracture with small volume herniated fat.

Other: Trace mastoid fluid on the right is stable. Negative visible
nasopharynx.

MRI CERVICAL SPINE FINDINGS

Alignment: Preserved cervical lordosis.  No spondylolisthesis.

Vertebrae: No marrow edema or evidence of acute osseous abnormality.
Visualized bone marrow signal is within normal limits.

Cord: Abnormal patchy but fairly symmetric T2 and STIR hyperintense
signal in the ventral cord centered at C4 (series 24, image 8). This
has indistinct margins, but on axial images localizes to the ventral
cord in a fairly symmetric fashion (series 26, image 14). This seems
somewhat eccentric to the central cord gray matter. No cord volume
loss is associated.

Above and below that level the visible spinal cord appears normal.

Posterior Fossa, vertebral arteries, paraspinal tissues: Left
vertebral artery flow void is lost in the neck in keeping with the
CTA findings today.

Cervicomedullary junction is within normal limits.

Negative visible neck soft tissues and lung apices.

Disc levels:

C2-C3: Mild foraminal disc osteophyte complex. Mild facet
hypertrophy. No spinal stenosis. Mild to moderate right C3 foraminal
stenosis.

C3-C4: Foraminal disc osteophyte complex primarily on the right. No
spinal stenosis. Moderate to severe right C4 foraminal stenosis.

C4-C5: Minimal disc bulge and endplate spurring primarily at the
foramen. No spinal stenosis. Mild to moderate right C5 foraminal
stenosis.

C5-C6: Minimal disc bulge and endplate spurring. Mild facet and
ligament flavum hypertrophy. No spinal stenosis. Mild bilateral C6
foraminal stenosis.

C6-C7: Minimal disc bulge and endplate spurring. Mild facet and
ligament flavum hypertrophy. No significant stenosis.

C7-T1:  Mild facet and ligament flavum hypertrophy.  No stenosis.

Mild dorsal upper thoracic epidural lipomatosis.
IMPRESSION: 1. Abnormal signal in the ventral cervical spinal cord centered at
the lower C4 level.
This does not appear related to any compressive myelopathy, and
although the margins are indistinct a cord neoplasm is felt
unlikely.
Inflammatory or possibly ischemic (see #2) etiology are favored over
other considerations. Follow-up post-contrast cervical spine imaging
recommended to further characterize.

2. Severe intracranial small vessel disease appears stable since
[REDACTED], including numerous chronic microhemorrhages in the
thalami. No acute intracranial abnormality.

3. Generally mild for age cervical spine degeneration, with no
significant spinal stenosis. However, there is multilevel moderate
and occasionally severe right side cervical neural foraminal
stenosis.

## 2020-03-20 IMAGING — CT CT HEAD CODE STROKE
4 series · 16 of 47 positions shown, 18 images · non-contrast
Comparison: None.

CLINICAL DATA: Code stroke.  Bilateral arm and leg weakness

EXAM:
CT HEAD WITHOUT CONTRAST
TECHNIQUE: Contiguous axial images were obtained from the base of the skull
through the vertex without intravenous contrast.

[Series 2: head wo · axial · 0.40mm/px · z∈[-154,-38]mm · 7 of 31 slices shown, 9 images]
[im 4/31  brain]
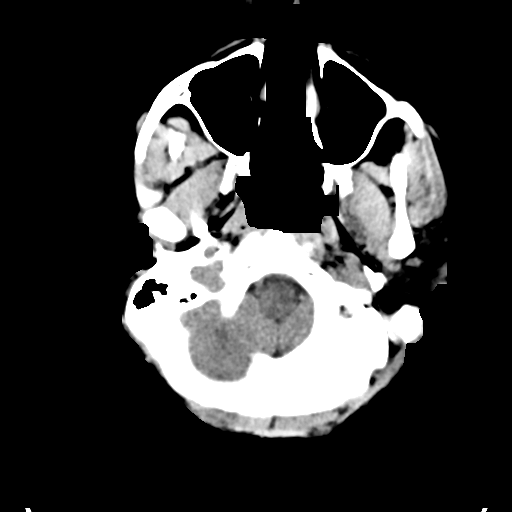
[im 4/31  bone]
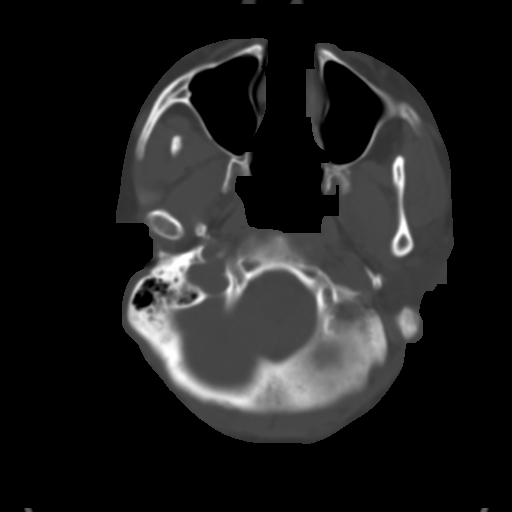
[im 8/31  brain]
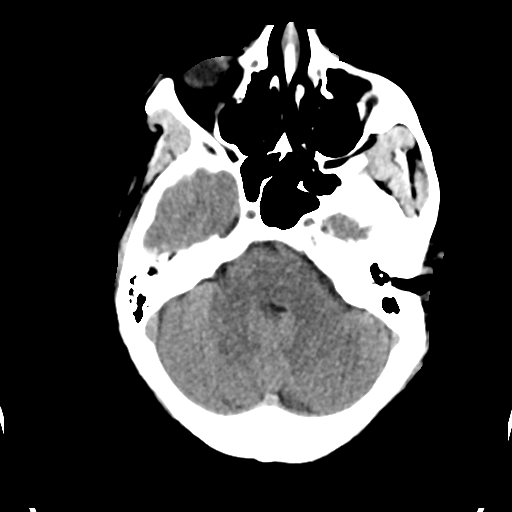
[im 12/31  brain]
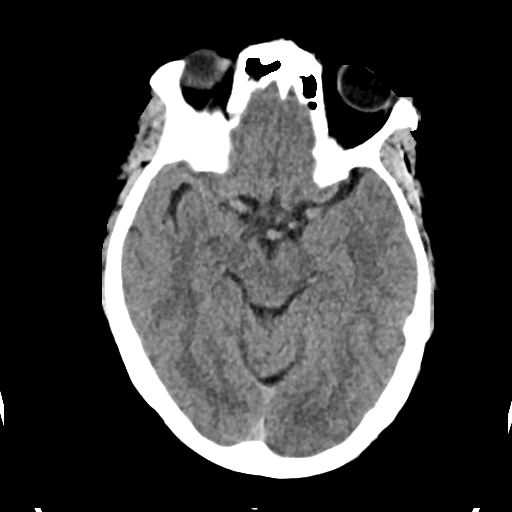
[im 16/31  brain]
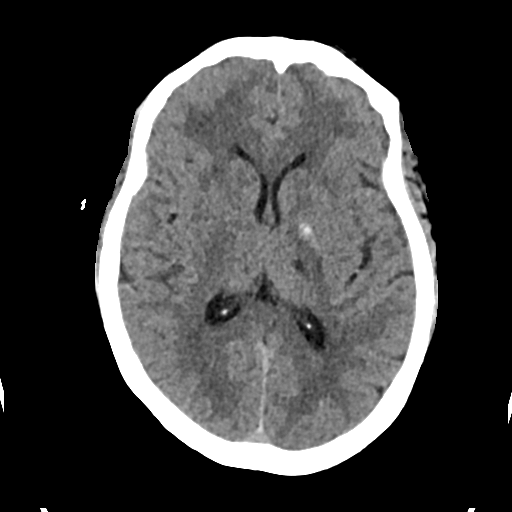
[im 19/31  brain]
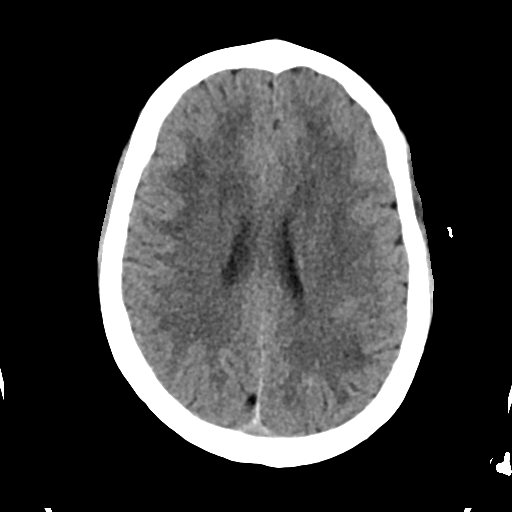
[im 19/31  bone]
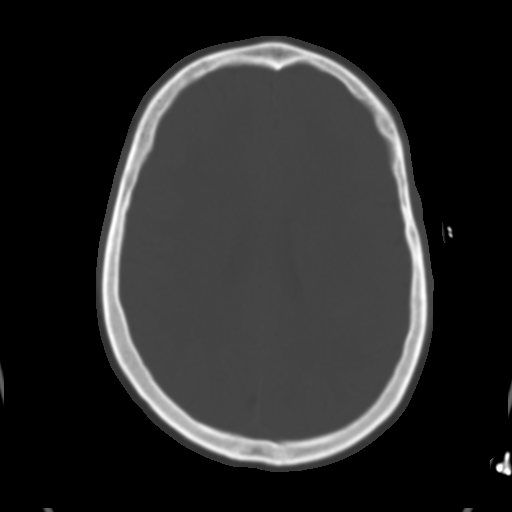
[im 23/31  brain]
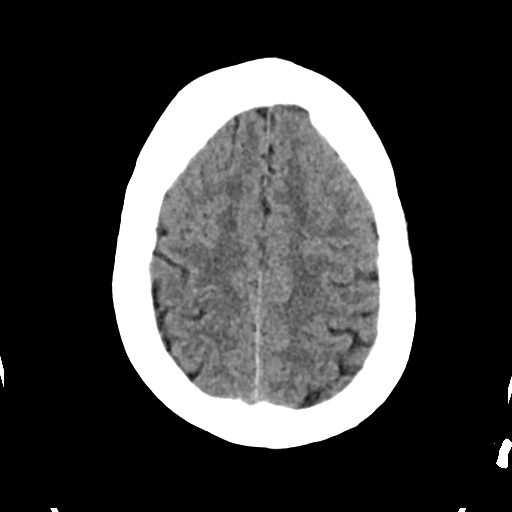
[im 27/31  brain]
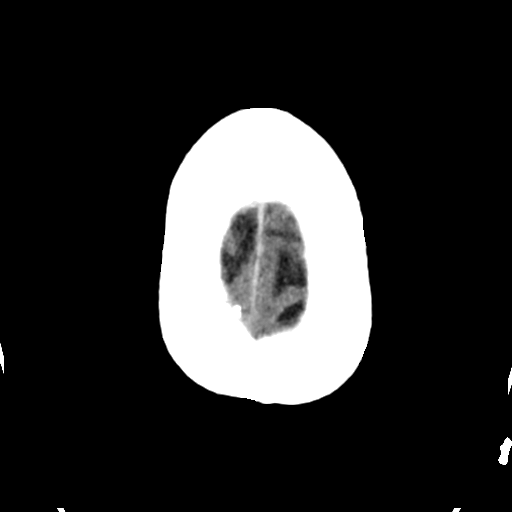

[Series 4: head bone · axial · 0.40mm/px · z∈[-154,-124]mm · 3 of 77 slices shown]
[im 8/77  bone]
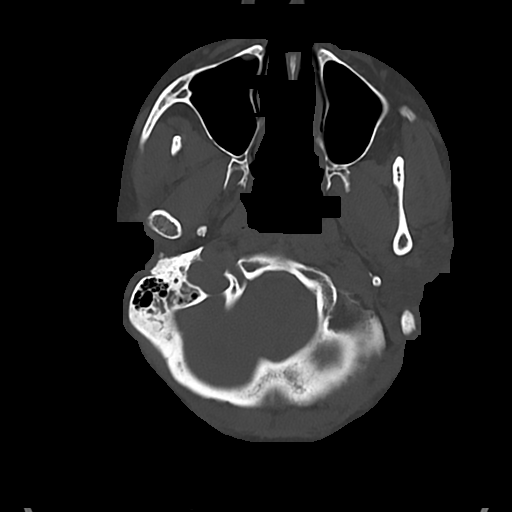
[im 16/77  bone]
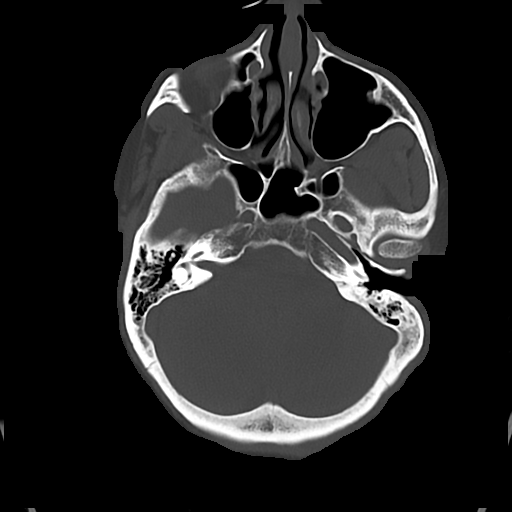
[im 23/77  bone]
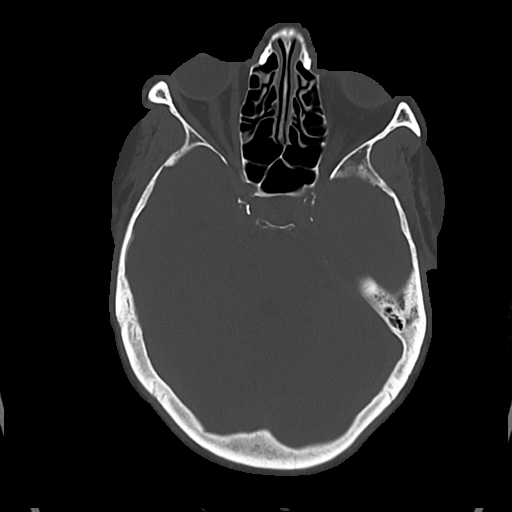

[Series 5: cor soft · coronal · 0.31mm/px · 3 of 65 slices shown]
[im 22/65  brain]
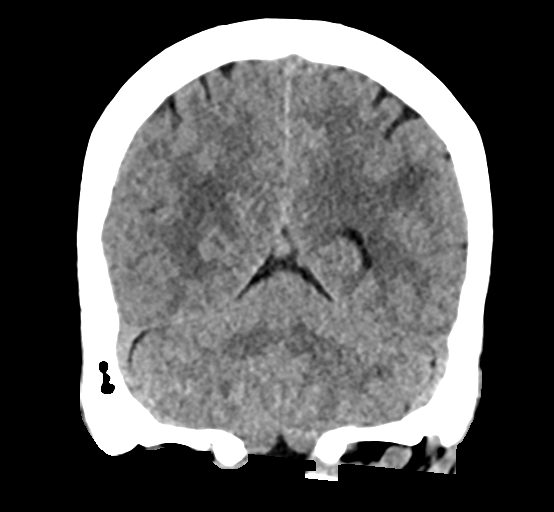
[im 29/65  brain]
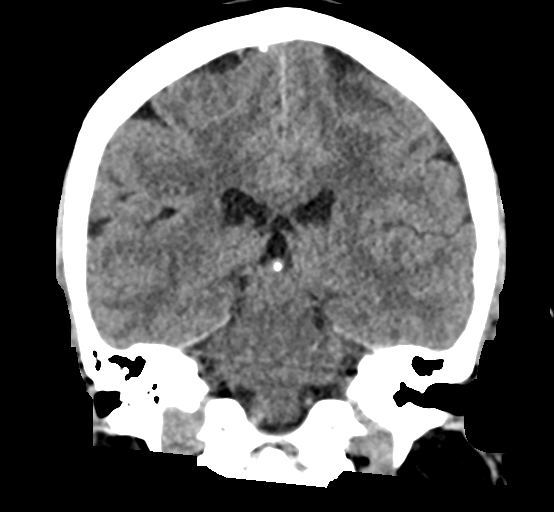
[im 36/65  brain]
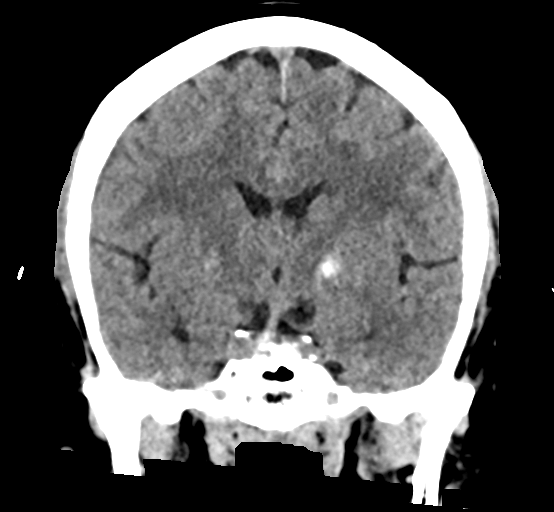

[Series 6: sag soft · sagittal · 0.31mm/px · 3 of 51 slices shown]
[im 17/51  brain]
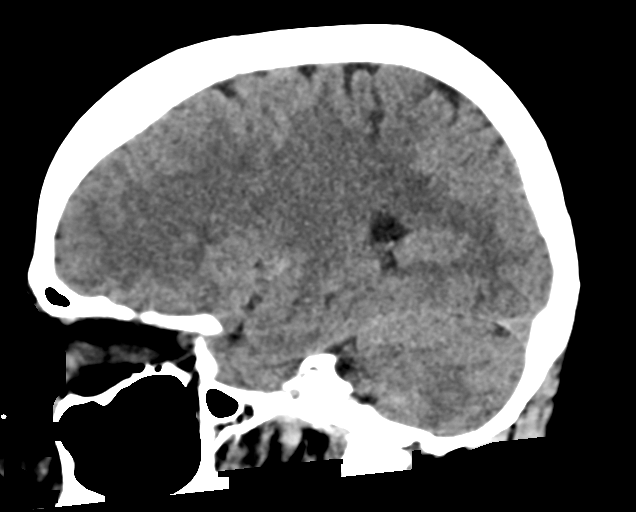
[im 26/51  brain]
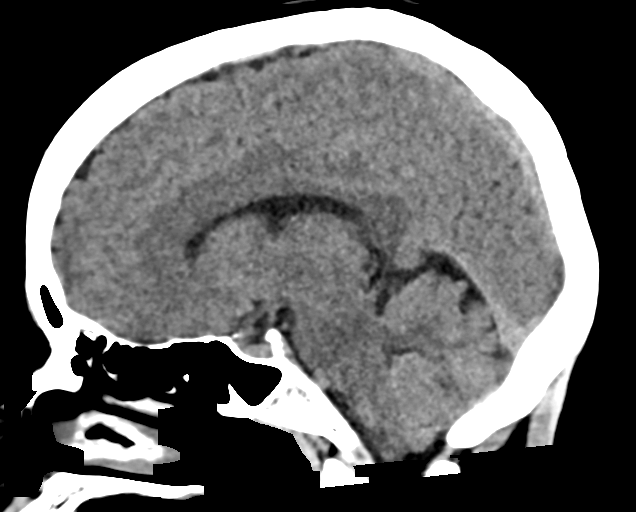
[im 34/51  brain]
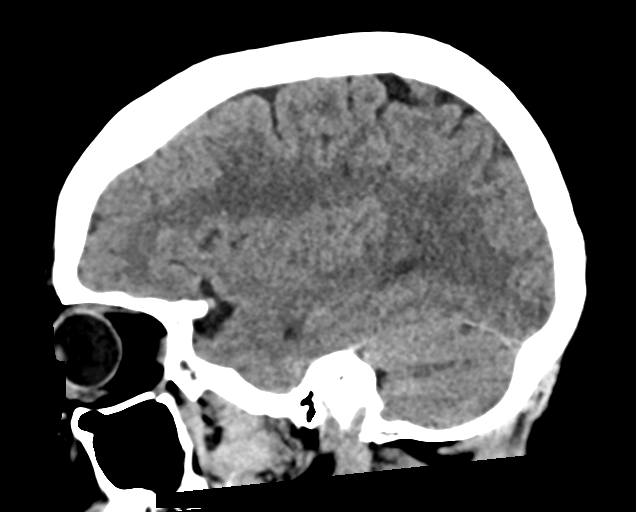

[16 of 47 positions shown; findings below may reference images not displayed]

FINDINGS: Brain: There is no mass, hemorrhage or extra-axial collection. The
size and configuration of the ventricles and extra-axial CSF spaces
are normal. Dense basal ganglia mineralization. Old left thalamic
small vessel infarct. There is periventricular hypoattenuation
compatible with chronic microvascular disease.

Vascular: Atherosclerotic calcification of the internal carotid
arteries at the skull base. No abnormal hyperdensity of the major
intracranial arteries or dural venous sinuses.

Skull: The visualized skull base, calvarium and extracranial soft
tissues are normal.

Sinuses/Orbits: No fluid levels or advanced mucosal thickening of
the visualized paranasal sinuses. No mastoid or middle ear effusion.
The orbits are normal.

ASPECTS (Alberta Stroke Program Early CT Score)

- Ganglionic level infarction (caudate, lentiform nuclei, internal
capsule, insula, M1-M3 cortex): 7

- Supraganglionic infarction (M4-M6 cortex): 3

Total score (0-10 with 10 being normal): 10
IMPRESSION: 1. No acute intracranial abnormality.
2. ASPECTS is 10.
3. Chronic microvascular ischemia and old left thalamic small vessel
infarct.

These results were communicated to Dr. ABDULWASA at [DATE] on
[DATE] by text page via the [employer] messaging system.

## 2020-03-20 IMAGING — MR MR CERVICAL SPINE W/ CM
3 series · 19 of 48 positions shown · IV contrast (Yes   MULTIHANCE)
Comparison: MRI of the cervical spine [DATE] at [DATE]
a.m.

CLINICAL DATA: Neck pain. Follow-up of prior MRI of the cervical
spine

EXAM:
MRI CERVICAL SPINE WITH CONTRAST
TECHNIQUE: Multiplanar, multisequence MR imaging of the cervical spine was
performed following the administration of intravenous contrast.

[Series 4: T1 · axial · non-contrast · 3.0mm · 0.39mm/px · z∈[-18,+96]mm · 9 of 34 slices shown]
[im 2/34]
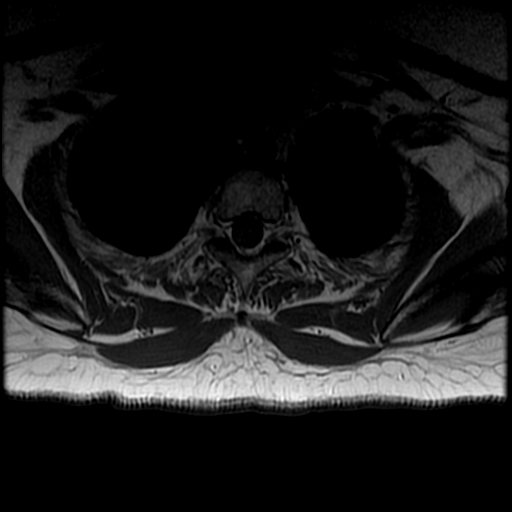
[im 6/34]
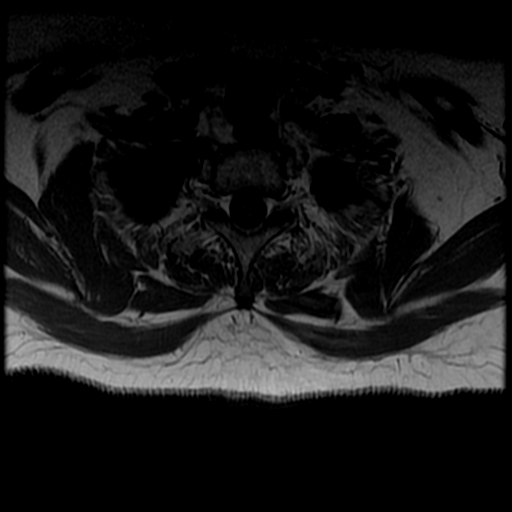
[im 10/34]
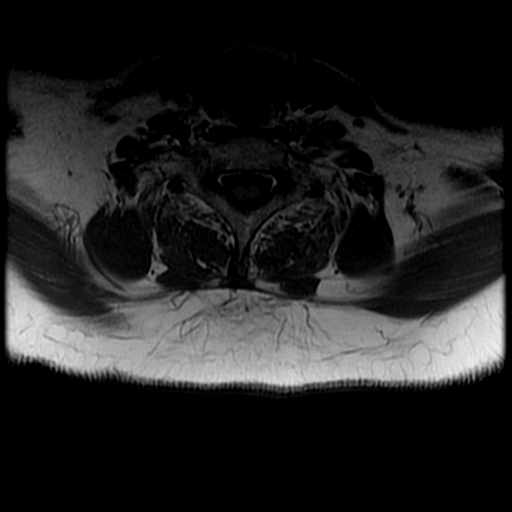
[im 15/34]
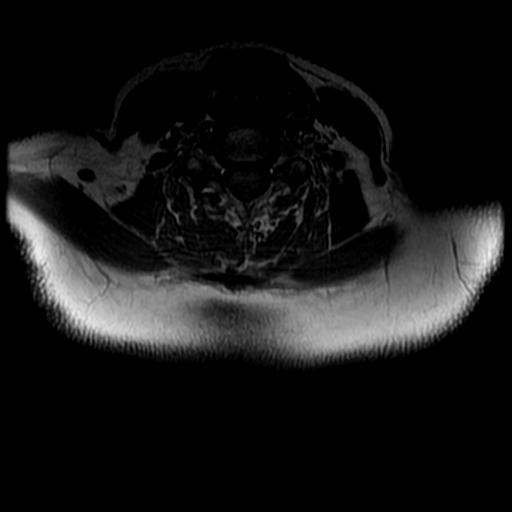
[im 17/34]
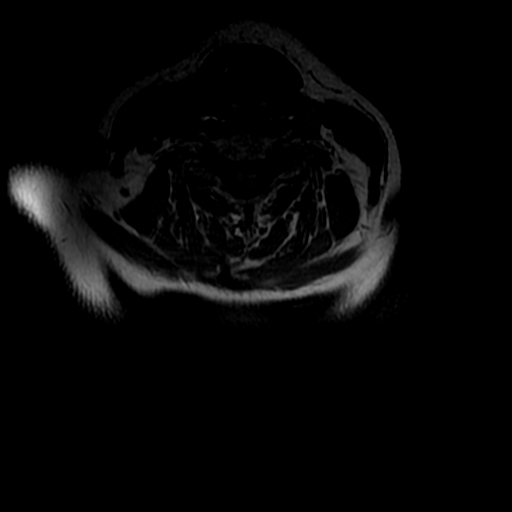
[im 19/34]
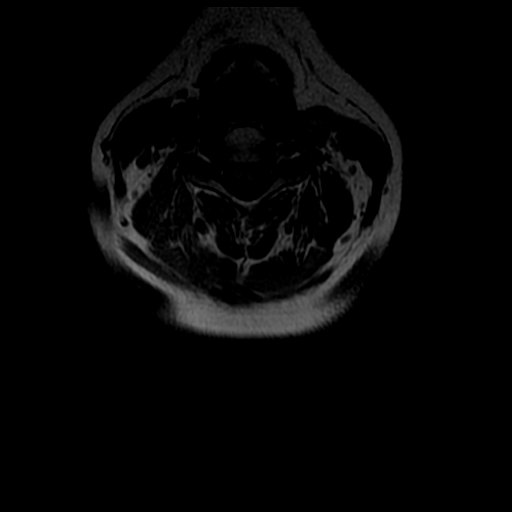
[im 24/34]
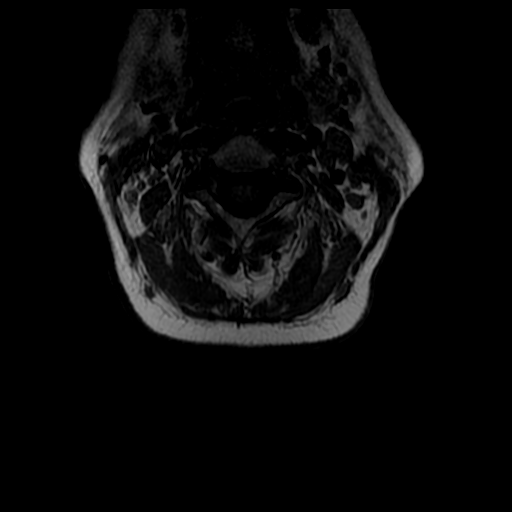
[im 28/34]
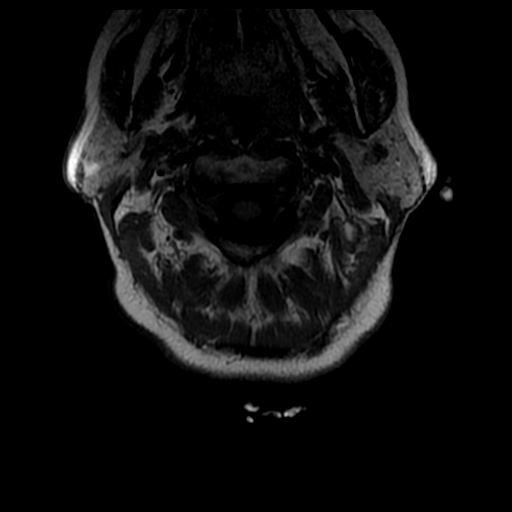
[im 32/34]
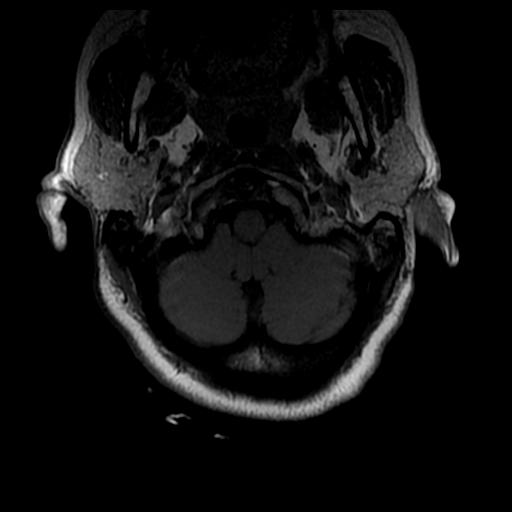

[Series 5: T1 fat-sat post-contrast · sagittal · 3.0mm · 0.43mm/px · 7 of 15 slices shown]
[im 1/15]
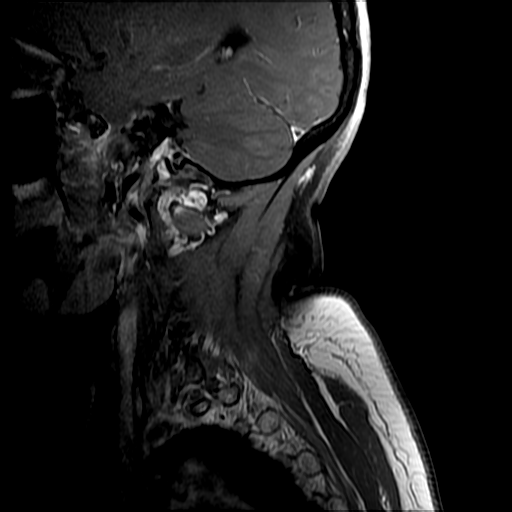
[im 2/15]
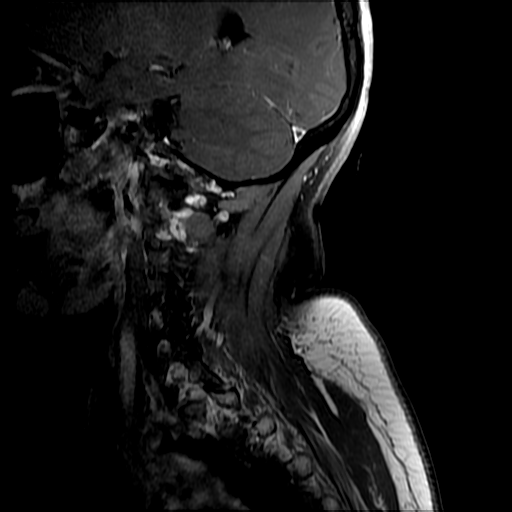
[im 4/15]
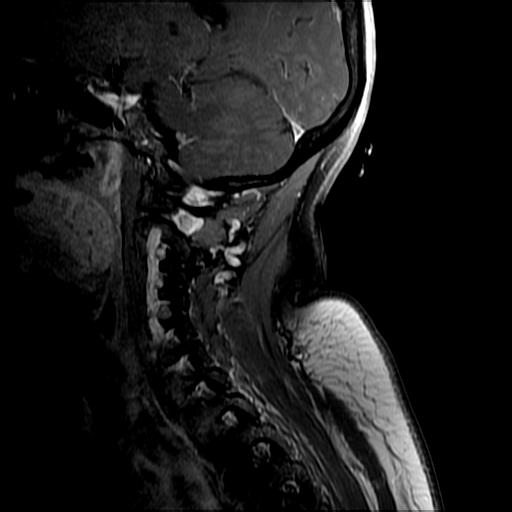
[im 6/15]
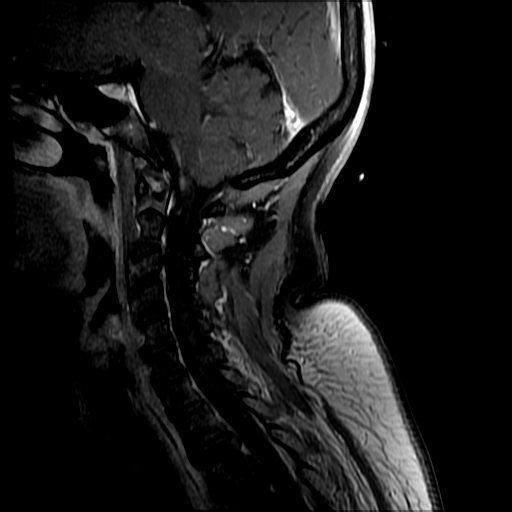
[im 8/15]
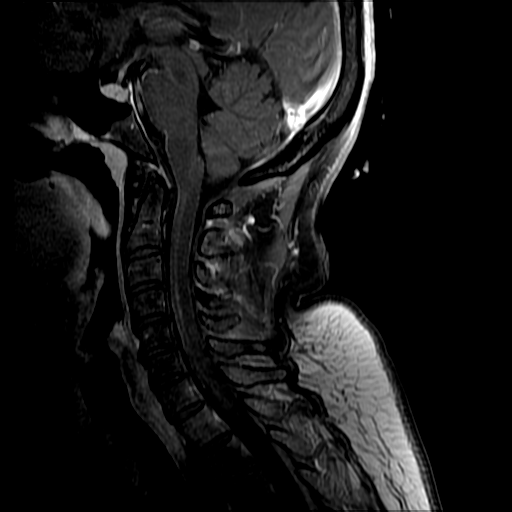
[im 9/15]
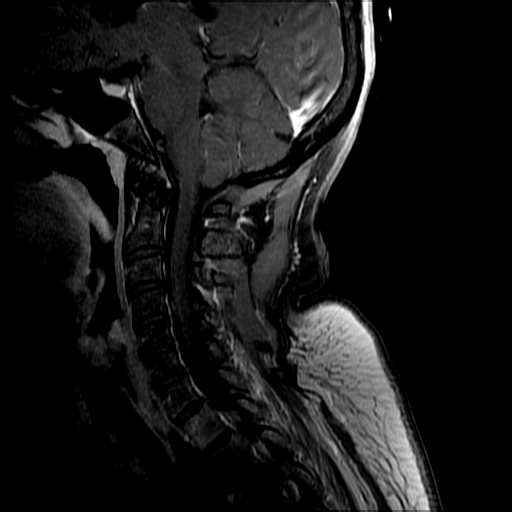
[im 13/15]
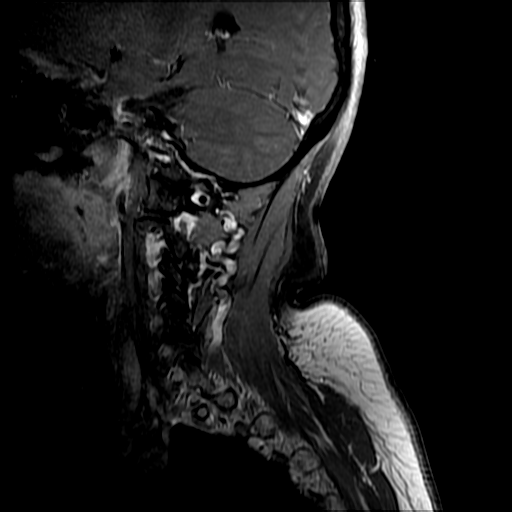

[Series 6: T1 post-contrast · axial · 3.0mm · 0.39mm/px · z∈[-3,+80]mm · 3 of 34 slices shown]
[im 6/34]
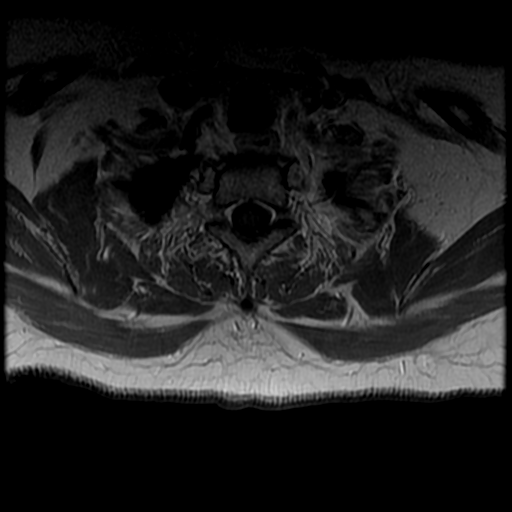
[im 18/34]
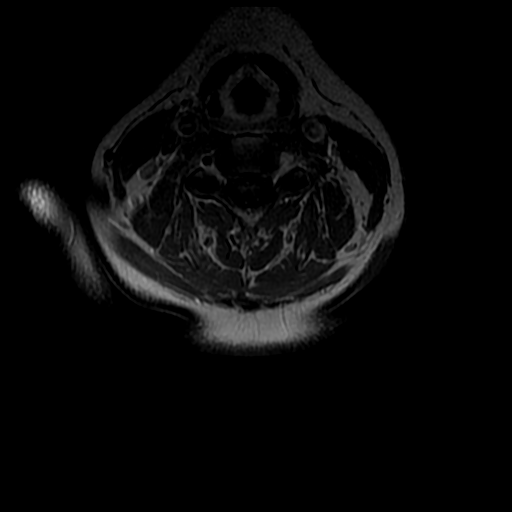
[im 28/34]
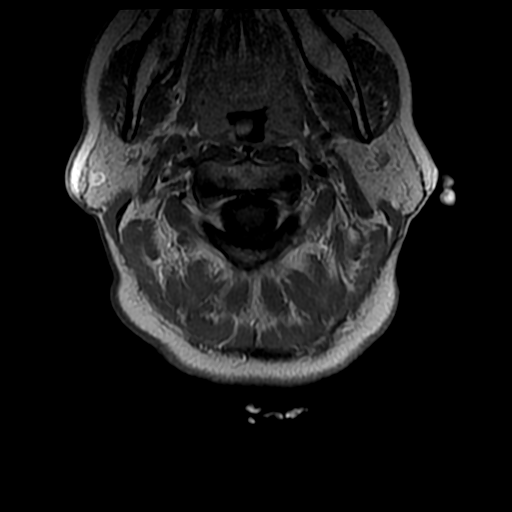

[19 of 48 positions shown; findings below may reference images not displayed]

FINDINGS: Postcontrast images obtained to evaluate T2 hyperintense lesion
within the anterior aspect of the cord at the C4 level. Faint
contrast enhancement is noted associated with the area of increased
T2 signal without evidence of nodularity or mass effect.

Contrast enhancement within the visualized left vertebral artery
likely represents enhancing thrombus. Normal flow void in the right
vertebral artery.
IMPRESSION: 1. Postcontrast images obtained to further evaluate abnormal signal
within the anterior aspect of the spinal cord at the C4 level. There
is faint contrast enhancement associated with the area of increased
T2 signal without evidence of nodularity or mass effect. Findings
are suggestive of cord ischemia.
2. Contrast enhancement within the visualized left vertebral artery
likely represents enhancing thrombus.

## 2020-03-20 IMAGING — CT CT ANGIO NECK
2 of 7 series · 8 of 33 positions shown · IV contrast (APPLIED)
Comparison: None.

CLINICAL DATA: Generalized weakness

EXAM:
CT ANGIOGRAPHY HEAD AND NECK
TECHNIQUE: Multidetector CT imaging of the head and neck was performed using
the standard protocol during bolus administration of intravenous
contrast. Multiplanar CT image reconstructions and MIPs were
obtained to evaluate the vascular anatomy. Carotid stenosis
measurements (when applicable) are obtained utilizing NASCET
criteria, using the distal internal carotid diameter as the
denominator.
CONTRAST:  75mL OMNIPAQUE IOHEXOL 350 MG/ML SOLN

[Series 5: cta neck/head · axial · 0.45mm/px · z∈[-234,-124]mm · 2 of 165 slices shown]
[im 55/165  soft-tissue]
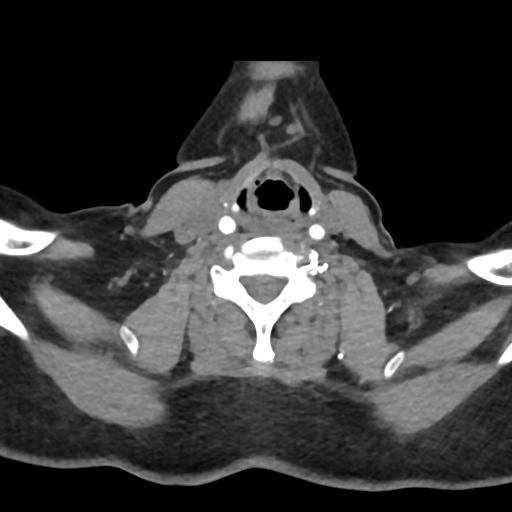
[im 110/165  soft-tissue]
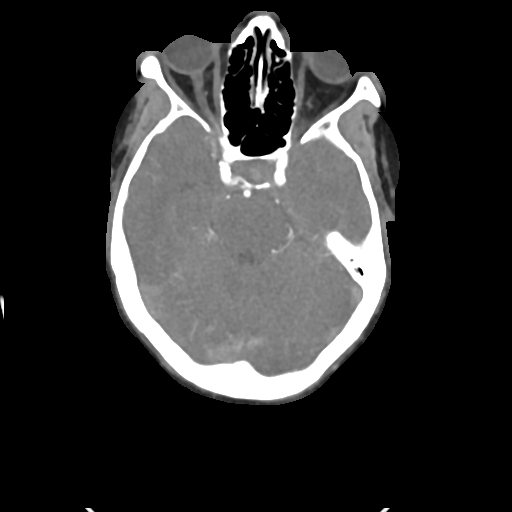

[Series 7: ax thins · axial · 0.39mm/px · z∈[-296,-61]mm · 6 of 329 slices shown]
[im 47/329  soft-tissue]
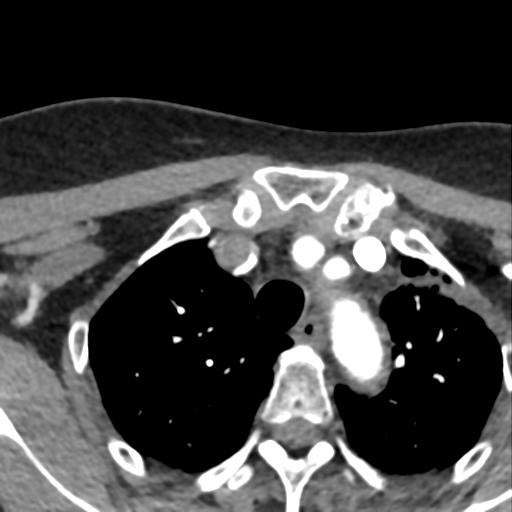
[im 94/329  bone]
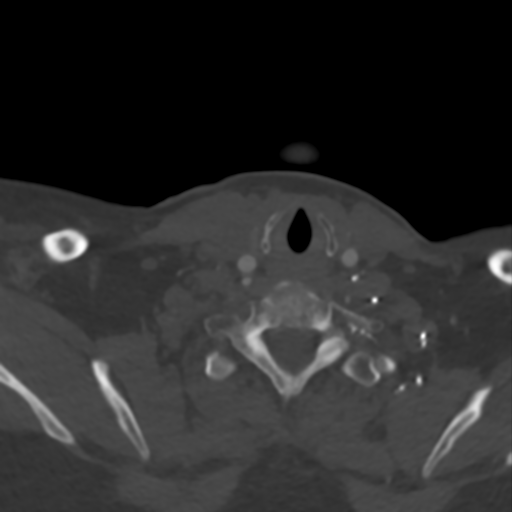
[im 141/329  soft-tissue]
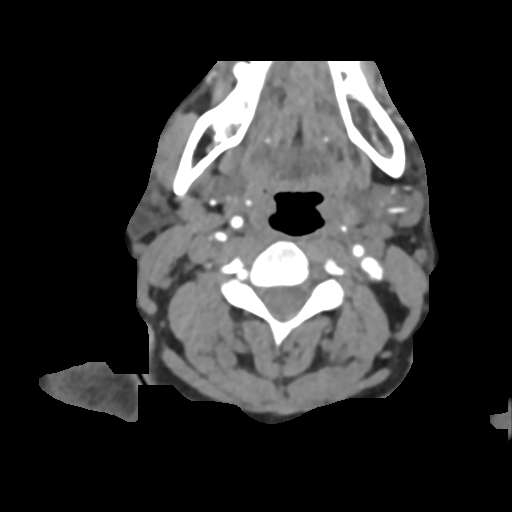
[im 188/329  bone]
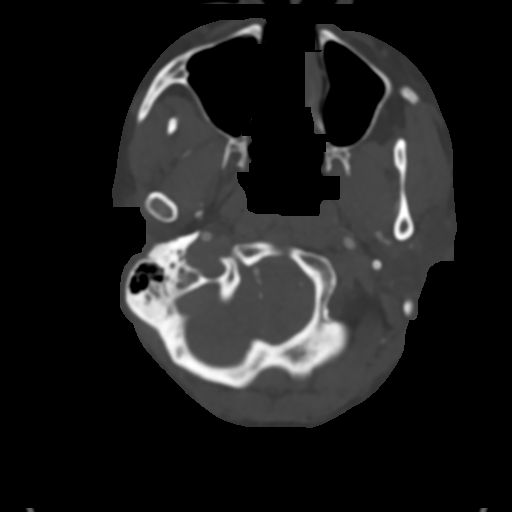
[im 235/329  soft-tissue]
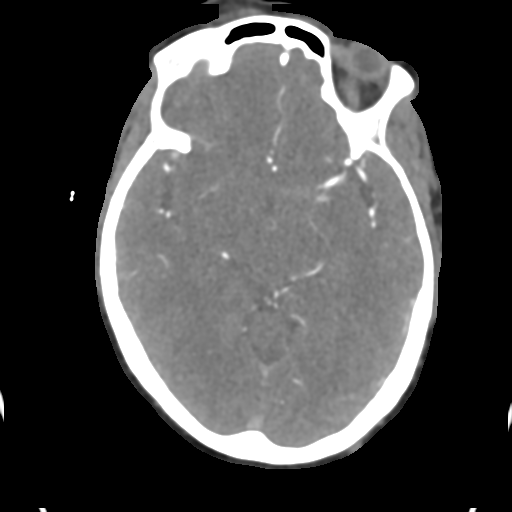
[im 282/329  bone]
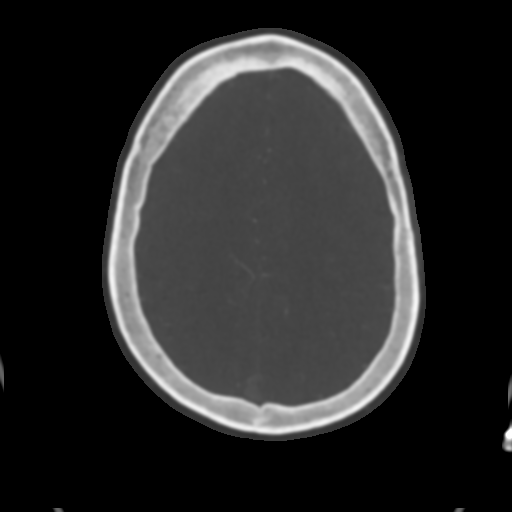

[8 of 33 positions shown; findings below may reference images not displayed]

FINDINGS: CTA NECK FINDINGS

SKELETON: There is no bony spinal canal stenosis. No lytic or
blastic lesion.

OTHER NECK: Normal pharynx, larynx and major salivary glands. No
cervical lymphadenopathy. Unremarkable thyroid gland.

UPPER CHEST: Bandlike atelectasis in the left upper lobe.

AORTIC ARCH:

There is no calcific atherosclerosis of the aortic arch. There is no
aneurysm, dissection or hemodynamically significant stenosis of the
visualized portion of the aorta. Conventional 3 vessel aortic
branching pattern. The visualized proximal subclavian arteries are
widely patent.

RIGHT CAROTID SYSTEM: No dissection, occlusion or aneurysm. There is
low density atherosclerosis extending into the proximal ICA,
resulting in 50% stenosis.

LEFT CAROTID SYSTEM: No dissection, occlusion or aneurysm. Mild
atherosclerotic calcification at the carotid bifurcation without
hemodynamically significant stenosis.

VERTEBRAL ARTERIES: Codominant configuration. Left vertebral artery
is occluded at its origin. Right vertebral artery is normal to the
vertebrobasilar confluence.

CTA HEAD FINDINGS

POSTERIOR CIRCULATION:

--Vertebral arteries: There is opacification of the distal left V4
segment due to collateral flow across the vertebrobasilar
confluence.

--Inferior cerebellar arteries: Right PICA is patent but the left
PICA is occluded.

--Basilar artery: Normal.

--Superior cerebellar arteries: Normal.

--Posterior cerebral arteries (PCA): Normal.

ANTERIOR CIRCULATION:

--Intracranial internal carotid arteries: Normal.

--Anterior cerebral arteries (ACA): Normal. Both A1 segments are
present. Patent anterior communicating artery (a-comm).

--Middle cerebral arteries (MCA): Normal.

VENOUS SINUSES: As permitted by contrast timing, patent.

ANATOMIC VARIANTS: None

Review of the MIP images confirms the above findings.
IMPRESSION: 1. No intracranial arterial occlusion or high-grade stenosis.
2. Occlusion of the left vertebral artery at its origin with
reconstitution of the distal left V4 segment due to collateral flow
across the vertebrobasilar confluence.
3. Approximately 50% stenosis of the proximal right internal carotid
artery secondary to low density atherosclerosis.

Critical Value/emergent results were called by telephone at the time
of interpretation on [DATE] at [DATE] to provider KHAM
, who verbally acknowledged these results.

## 2020-03-20 IMAGING — MR MR CERVICAL SPINE W/O CM
16 of 18 series · 42 of 48 positions shown · non-contrast
Comparison: Head CT, CTA head and neck earlier today.

[REDACTED] [HOSPITAL] [HOSPITAL] brain MRI
[DATE]. CT cervical spine [DATE].

CLINICAL DATA: 65-year-old female status post code stroke
presentation this morning. Extremity weakness.

EXAM:
MRI HEAD WITHOUT CONTRAST
MRI CERVICAL SPINE WITHOUT CONTRAST
TECHNIQUE: Multiplanar, multiecho pulse sequences of the brain and surrounding
structures, and cervical spine, to include the craniocervical
junction and cervicothoracic junction, were obtained without
intravenous contrast.

[Series 5: DWI · axial · 3.0mm · 0.88mm/px · z∈[-118,+22]mm · 6 of 96 slices shown (1 of 4)]
[im 1/96]
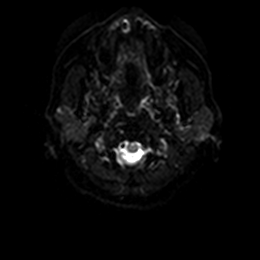
[im 20/96]
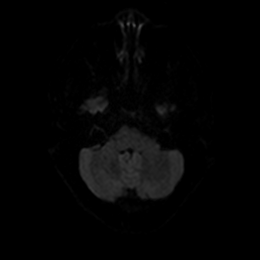
[im 39/96]
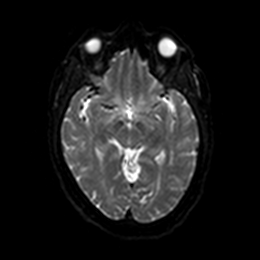
[im 58/96]
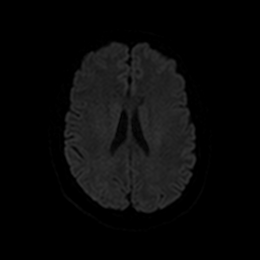
[im 77/96]
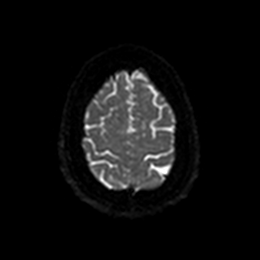
[im 96/96]
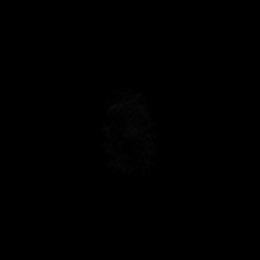

[Series 6: DWI · axial · 3.0mm · 0.88mm/px · z∈[-118,+22]mm · 3 of 48 slices shown (2 of 4)]
[im 1/48]
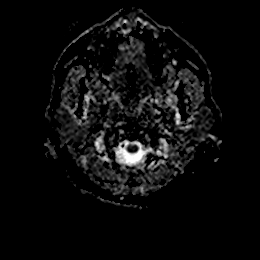
[im 24/48]
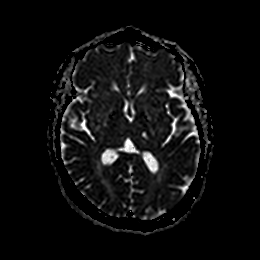
[im 48/48]
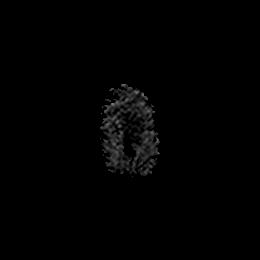

[Series 7: DWI · coronal · 4.0mm · 0.88mm/px · 4 of 64 slices shown (3 of 4)]
[im 1/64]
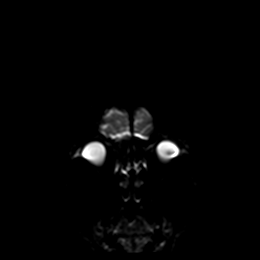
[im 22/64]
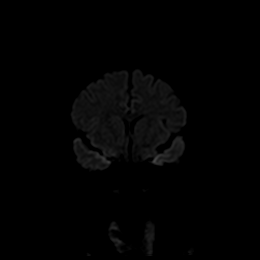
[im 43/64]
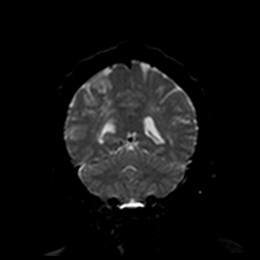
[im 64/64]
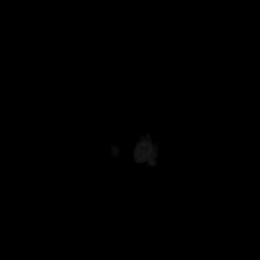

[Series 8: DWI · coronal · 4.0mm · 0.88mm/px · 2 of 32 slices shown (4 of 4)]
[im 1/32]
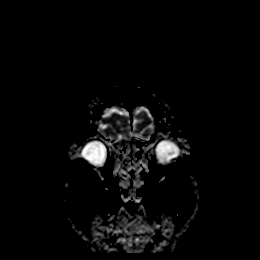
[im 32/32]
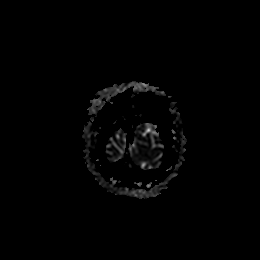

[Series 9: T1 · sagittal · 5.0mm · 0.75mm/px · 1 of 23 slices shown (1 of 2)]
[im 1/23]
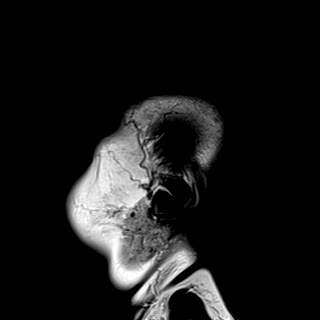

[Series 10: T2 · axial · 5.0mm · 0.72mm/px · z∈[-120,+24]mm · 2 of 25 slices shown (1 of 4)]
[im 1/25]
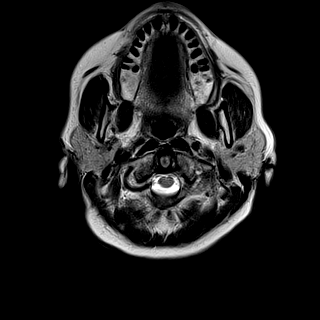
[im 25/25]
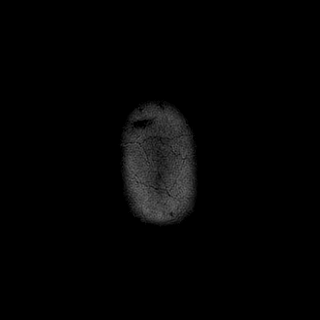

[Series 11: FLAIR · axial · 5.0mm · 0.45mm/px · z∈[-120,+24]mm · 2 of 25 slices shown]
[im 1/25]
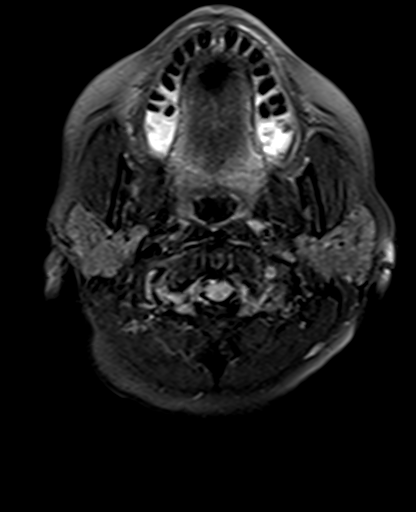
[im 25/25]
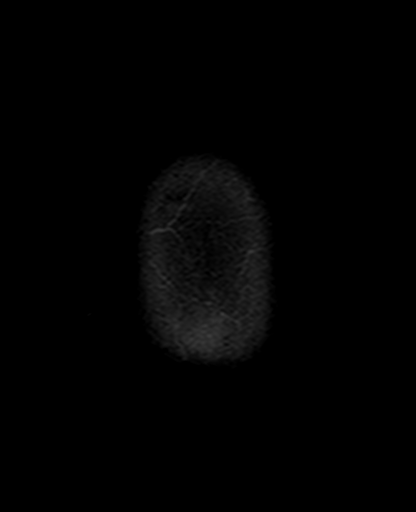

[Series 12: mag_images · axial · 3.0mm · 0.90mm/px · z∈[-136,+40]mm · 4 of 60 slices shown]
[im 1/60]
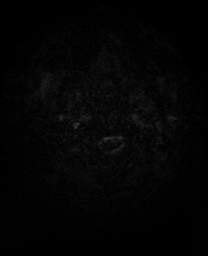
[im 20/60]
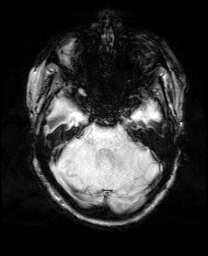
[im 40/60]
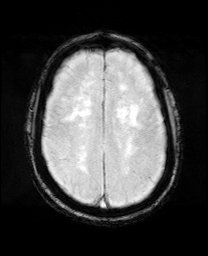
[im 60/60]
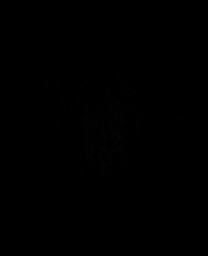

[Series 13: pha_images · axial · 3.0mm · 0.90mm/px · z∈[-136,+31]mm · 4 of 57 slices shown]
[im 1/57]
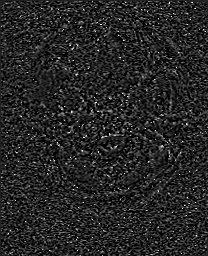
[im 19/57]
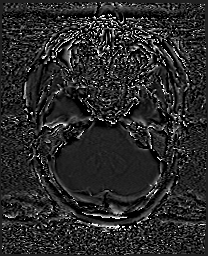
[im 38/57]
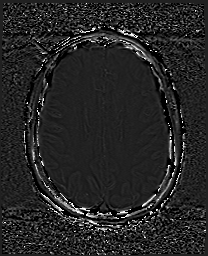
[im 57/57]
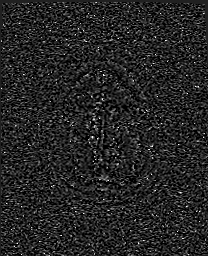

[Series 14: swi_images · axial · 3.0mm · 0.90mm/px · z∈[-136,+40]mm · 4 of 60 slices shown]
[im 1/60]
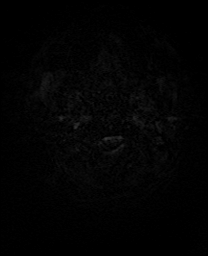
[im 20/60]
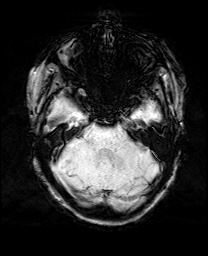
[im 40/60]
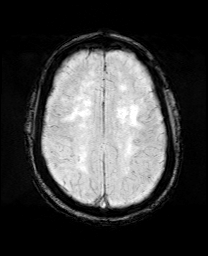
[im 60/60]
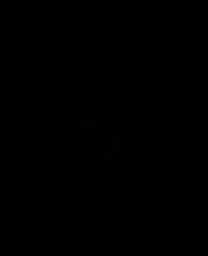

[Series 15: mip_images(sw) · axial · 24.0mm · 0.90mm/px · z∈[-126,+30]mm · 3 of 53 slices shown]
[im 1/53]
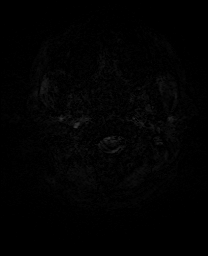
[im 27/53]
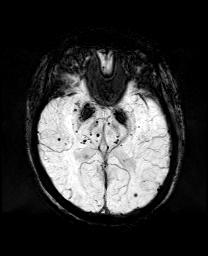
[im 53/53]
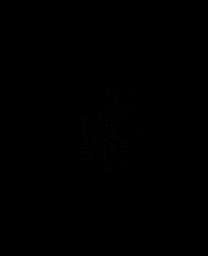

[Series 17: T2 · coronal · 5.0mm · 0.34mm/px · 2 of 29 slices shown (2 of 4)]
[im 1/29]
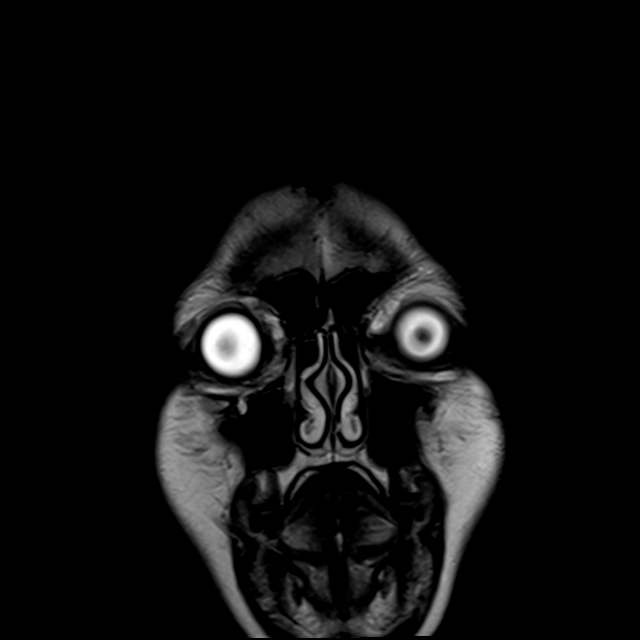
[im 29/29]
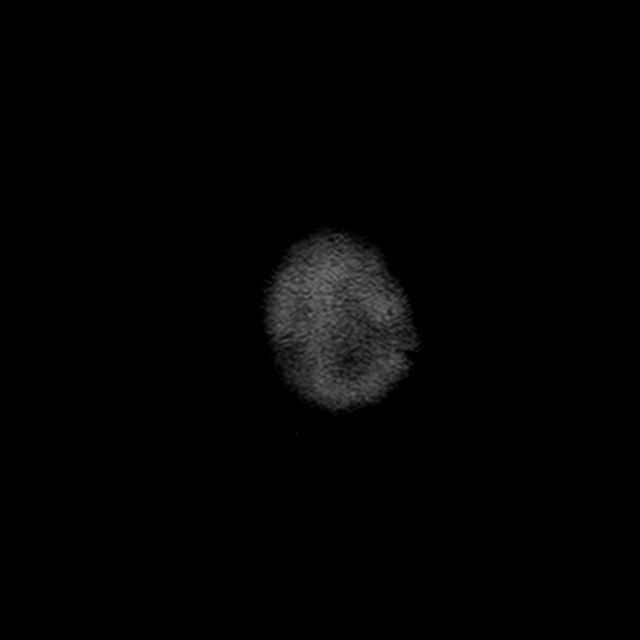

[Series 23: T1 · sagittal · 3.0mm · 0.69mm/px · 1 of 15 slices shown (2 of 2)]
[im 1/15]
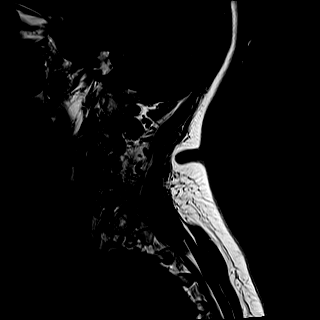

[Series 24: T2 · sagittal · 3.0mm · 0.69mm/px · 1 of 15 slices shown (3 of 4)]
[im 1/15]
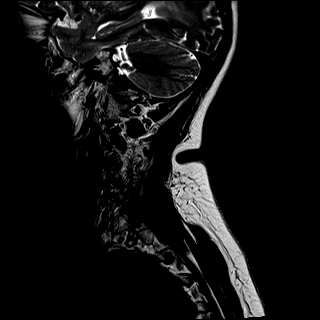

[Series 25: STIR · sagittal · 3.0mm · 0.86mm/px · 1 of 15 slices shown]
[im 1/15]
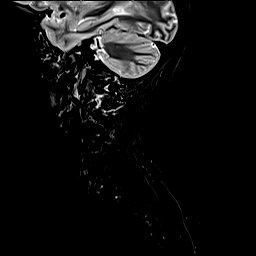

[Series 26: T2 · axial · 3.0mm · 0.66mm/px · z∈[-262,-165]mm · 2 of 34 slices shown (4 of 4)]
[im 1/34]
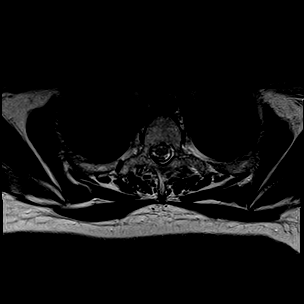
[im 34/34]
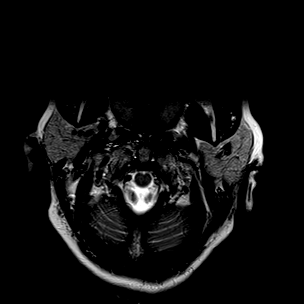

[42 of 48 positions shown; findings below may reference images not displayed]

FINDINGS: MRI HEAD FINDINGS

Brain: No restricted diffusion or evidence of acute infarction.

Advanced bilateral cerebral white matter T2 and FLAIR hyperintensity
which is Patchy and confluent in both hemispheres. Bilateral deep
white matter capsule involvement. Superimposed chronic lacunar
infarcts in the thalami, larger on the left (series 10, image 14).
T2 and FLAIR heterogeneity throughout the bilateral basal ganglia,
and in the pons. Numerous chronic microhemorrhages in the deep gray
nuclei, especially the thalami. Scattered hemispheric and left
cerebellar chronic microhemorrhage elsewhere. Stable SWI since
[REDACTED].

No definite cortical encephalomalacia. No midline shift, mass
effect, evidence of mass lesion, ventriculomegaly, extra-axial
collection or acute intracranial hemorrhage. Cervicomedullary
junction and pituitary are within normal limits.

Vascular: Major intracranial vascular flow voids are stable since
[REDACTED], preserved.

Skull and upper cervical spine: Cervical spine detailed below.
Visualized bone marrow signal is within normal limits.

Sinuses/Orbits: Stable, and negative aside from chronic right
orbital floor fracture with small volume herniated fat.

Other: Trace mastoid fluid on the right is stable. Negative visible
nasopharynx.

MRI CERVICAL SPINE FINDINGS

Alignment: Preserved cervical lordosis.  No spondylolisthesis.

Vertebrae: No marrow edema or evidence of acute osseous abnormality.
Visualized bone marrow signal is within normal limits.

Cord: Abnormal patchy but fairly symmetric T2 and STIR hyperintense
signal in the ventral cord centered at C4 (series 24, image 8). This
has indistinct margins, but on axial images localizes to the ventral
cord in a fairly symmetric fashion (series 26, image 14). This seems
somewhat eccentric to the central cord gray matter. No cord volume
loss is associated.

Above and below that level the visible spinal cord appears normal.

Posterior Fossa, vertebral arteries, paraspinal tissues: Left
vertebral artery flow void is lost in the neck in keeping with the
CTA findings today.

Cervicomedullary junction is within normal limits.

Negative visible neck soft tissues and lung apices.

Disc levels:

C2-C3: Mild foraminal disc osteophyte complex. Mild facet
hypertrophy. No spinal stenosis. Mild to moderate right C3 foraminal
stenosis.

C3-C4: Foraminal disc osteophyte complex primarily on the right. No
spinal stenosis. Moderate to severe right C4 foraminal stenosis.

C4-C5: Minimal disc bulge and endplate spurring primarily at the
foramen. No spinal stenosis. Mild to moderate right C5 foraminal
stenosis.

C5-C6: Minimal disc bulge and endplate spurring. Mild facet and
ligament flavum hypertrophy. No spinal stenosis. Mild bilateral C6
foraminal stenosis.

C6-C7: Minimal disc bulge and endplate spurring. Mild facet and
ligament flavum hypertrophy. No significant stenosis.

C7-T1:  Mild facet and ligament flavum hypertrophy.  No stenosis.

Mild dorsal upper thoracic epidural lipomatosis.
IMPRESSION: 1. Abnormal signal in the ventral cervical spinal cord centered at
the lower C4 level.
This does not appear related to any compressive myelopathy, and
although the margins are indistinct a cord neoplasm is felt
unlikely.
Inflammatory or possibly ischemic (see #2) etiology are favored over
other considerations. Follow-up post-contrast cervical spine imaging
recommended to further characterize.

2. Severe intracranial small vessel disease appears stable since
[REDACTED], including numerous chronic microhemorrhages in the
thalami. No acute intracranial abnormality.

3. Generally mild for age cervical spine degeneration, with no
significant spinal stenosis. However, there is multilevel moderate
and occasionally severe right side cervical neural foraminal
stenosis.

## 2020-03-20 MED ORDER — METOPROLOL SUCCINATE ER 25 MG PO TB24
25.0000 mg | ORAL_TABLET | Freq: Every day | ORAL | Status: DC
  Administered 2020-03-20 – 2020-03-28 (×7): 25 mg via ORAL
  Filled 2020-03-20 (×9): qty 1

## 2020-03-20 MED ORDER — FERROUS SULFATE 325 (65 FE) MG PO TABS
325.0000 mg | ORAL_TABLET | ORAL | Status: DC
  Administered 2020-03-20 – 2020-03-28 (×5): 325 mg via ORAL
  Filled 2020-03-20 (×6): qty 1

## 2020-03-20 MED ORDER — GABAPENTIN 600 MG PO TABS
300.0000 mg | ORAL_TABLET | Freq: Two times a day (BID) | ORAL | Status: DC
  Administered 2020-03-20 – 2020-03-28 (×17): 300 mg via ORAL
  Filled 2020-03-20 (×17): qty 1

## 2020-03-20 MED ORDER — STROKE: EARLY STAGES OF RECOVERY BOOK
Freq: Once | Status: AC
  Filled 2020-03-20: qty 1

## 2020-03-20 MED ORDER — POTASSIUM CHLORIDE 10 MEQ/100ML IV SOLN: 10.0000 meq | INTRAVENOUS | Status: DC

## 2020-03-20 MED ORDER — SODIUM CHLORIDE 0.9 % IV SOLN: INTRAVENOUS | Status: DC

## 2020-03-20 MED ORDER — CLEVIDIPINE BUTYRATE 0.5 MG/ML IV EMUL
0.0000 mg/h | INTRAVENOUS | Status: DC
  Administered 2020-03-20: 2 mg/h via INTRAVENOUS
  Filled 2020-03-20: qty 50

## 2020-03-20 MED ORDER — CLEVIDIPINE BUTYRATE 0.5 MG/ML IV EMUL
INTRAVENOUS | Status: AC
  Administered 2020-03-20: 1 mg/h via INTRAVENOUS
  Filled 2020-03-20: qty 50

## 2020-03-20 MED ORDER — PANTOPRAZOLE SODIUM 40 MG PO TBEC
40.0000 mg | DELAYED_RELEASE_TABLET | Freq: Every day | ORAL | Status: DC
  Administered 2020-03-20 – 2020-03-28 (×9): 40 mg via ORAL
  Filled 2020-03-20 (×9): qty 1

## 2020-03-20 MED ORDER — SODIUM CHLORIDE 0.9 % IV SOLN
50.0000 mL | Freq: Once | INTRAVENOUS | Status: AC
  Administered 2020-03-20: 50 mL via INTRAVENOUS

## 2020-03-20 MED ORDER — ACETAMINOPHEN 650 MG RE SUPP: 650.0000 mg | RECTAL | Status: DC | PRN

## 2020-03-20 MED ORDER — GADOBUTROL 1 MMOL/ML IV SOLN
9.0000 mL | Freq: Once | INTRAVENOUS | Status: AC | PRN
  Administered 2020-03-20: 9 mL via INTRAVENOUS

## 2020-03-20 MED ORDER — ONDANSETRON HCL 4 MG/2ML IJ SOLN
4.0000 mg | Freq: Once | INTRAMUSCULAR | Status: AC
  Administered 2020-03-20: 4 mg via INTRAVENOUS
  Filled 2020-03-20: qty 2

## 2020-03-20 MED ORDER — LABETALOL HCL 5 MG/ML IV SOLN
10.0000 mg | Freq: Once | INTRAVENOUS | Status: AC
  Administered 2020-03-20: 10 mg via INTRAVENOUS

## 2020-03-20 MED ORDER — CHLORHEXIDINE GLUCONATE CLOTH 2 % EX PADS
6.0000 | MEDICATED_PAD | Freq: Every day | CUTANEOUS | Status: DC
  Administered 2020-03-20 – 2020-03-24 (×3): 6 via TOPICAL

## 2020-03-20 MED ORDER — ACETAMINOPHEN 325 MG PO TABS
650.0000 mg | ORAL_TABLET | ORAL | Status: DC | PRN
  Administered 2020-03-21 – 2020-03-27 (×8): 650 mg via ORAL
  Filled 2020-03-20 (×10): qty 2

## 2020-03-20 MED ORDER — ATORVASTATIN CALCIUM 40 MG PO TABS
40.0000 mg | ORAL_TABLET | Freq: Every day | ORAL | Status: DC
  Administered 2020-03-20 – 2020-03-28 (×9): 40 mg via ORAL
  Filled 2020-03-20 (×9): qty 1

## 2020-03-20 MED ORDER — ALTEPLASE (STROKE) FULL DOSE INFUSION
0.9000 mg/kg | Freq: Once | INTRAVENOUS | Status: AC
  Administered 2020-03-20: 74.9 mg via INTRAVENOUS
  Filled 2020-03-20: qty 100

## 2020-03-20 MED ORDER — IOHEXOL 350 MG/ML SOLN
75.0000 mL | Freq: Once | INTRAVENOUS | Status: AC | PRN
  Administered 2020-03-20: 75 mL via INTRAVENOUS

## 2020-03-20 MED ORDER — POTASSIUM CHLORIDE CRYS ER 20 MEQ PO TBCR
40.0000 meq | EXTENDED_RELEASE_TABLET | ORAL | Status: AC
  Administered 2020-03-20 (×2): 40 meq via ORAL
  Filled 2020-03-20 (×2): qty 2

## 2020-03-20 MED ORDER — PANTOPRAZOLE SODIUM 40 MG IV SOLR: 40.0000 mg | Freq: Every day | INTRAVENOUS | Status: DC

## 2020-03-20 MED ORDER — EZETIMIBE 10 MG PO TABS
10.0000 mg | ORAL_TABLET | Freq: Every day | ORAL | Status: DC
  Administered 2020-03-20 – 2020-03-28 (×9): 10 mg via ORAL
  Filled 2020-03-20 (×9): qty 1

## 2020-03-20 MED ORDER — CHLORTHALIDONE 25 MG PO TABS
25.0000 mg | ORAL_TABLET | Freq: Every day | ORAL | Status: DC
  Administered 2020-03-20 – 2020-03-28 (×9): 25 mg via ORAL
  Filled 2020-03-20 (×9): qty 1

## 2020-03-20 MED ORDER — SENNOSIDES-DOCUSATE SODIUM 8.6-50 MG PO TABS
1.0000 | ORAL_TABLET | Freq: Every evening | ORAL | Status: DC | PRN
  Administered 2020-03-23 – 2020-03-28 (×4): 1 via ORAL
  Filled 2020-03-20 (×4): qty 1

## 2020-03-20 MED ORDER — ACETAMINOPHEN 160 MG/5ML PO SOLN: 650.0000 mg | ORAL | Status: DC | PRN

## 2020-03-20 NOTE — Progress Notes (Signed)
Pharmacist Code Stroke Response  Notified to mix tPA at 0141 by Dr. Curly Shores Delivered tPA to RN at 0144  tPA dose = 7.5mg  bolus over 1 minute followed by 67.4mg  for a total dose of 74.9mg  over 1 hour  Issues/delays encountered (if applicable): Delay of ~1JDB to ensure BP improved w/ labetalol and clevidipine infusion.  Wynona Neat, PharmD, BCPS  03/20/20 1:45 AM

## 2020-03-20 NOTE — Progress Notes (Signed)
PT Cancellation Note  Patient Details Name: Deborah [name] MRN: [id number] DOB: 30-May-1955   Cancelled Treatment:    Reason Eval/Treat Not Completed: (P) Active bedrest order;Medical issues which prohibited therapy Pt is on bedrest for 24 hrs s/p tPA. PT will follow back tomorrow for Evaluation.   Rawley Harju B. Migdalia Dk PT, DPT Acute Rehabilitation Services Pager [phone] Office [phone]  [place] 03/20/2020, 9:49 AM

## 2020-03-20 NOTE — Code Documentation (Signed)
Responded to Code Stroke called at 0050 for B extremity weakness (L>R), LSN-0000. Pt arrived at 0059, NIH-12, CBG-123, CT head negative, CTA-occlusion of L vertebral artery. Labetolol 10mg  given at 0140 and cleviprex gtt started at 0142. Once BP within TPA admin criteria, TPA started (0147). Plan admit to ICU.

## 2020-03-20 NOTE — Progress Notes (Signed)
SLP Cancellation Note  Patient Details Name: Mekenna Finau MRN: [id number] DOB: [birth year]   Cancelled treatment:        Attempted to see pt for cognitive linguistic evaluation.  Pt remains on HoB precautions at this time (<30 degrees).  Pt also remains NPO at this time for same.  If a swallow evaluation is desired when pt is off HoB precautions, please place orders.   Celedonio Savage, MA, Portal Office: [phone] 03/20/2020, 11:10 AM

## 2020-03-20 NOTE — Plan of Care (Signed)

## 2020-03-20 NOTE — ED Provider Notes (Signed)
[place] EMERGENCY DEPARTMENT Provider Note   CSN: [id number] Arrival date & time: 03/20/20  0059     History Chief Complaint  Patient presents with  . Code Stroke    Deborah [name] is a 65 y.o. female.  The history is provided by the patient and the EMS personnel. The history is limited by the condition of the patient.  Cerebrovascular Accident This is a new problem. The current episode started 1 to 2 hours ago. The problem occurs constantly. The problem has not changed since onset.Pertinent negatives include no chest pain, no abdominal pain, no headaches and no shortness of breath. Nothing aggravates the symptoms. Nothing relieves the symptoms. She has tried nothing for the symptoms. The treatment provided no relief.  Patient with HTN and previous CVA presents with left and right arm pain and then neck pain and then inability to move any of her extremities.  No facial droop.  No difficulty with speech or vision.      Past Medical History:  Diagnosis Date  . Cancer ([place])   . GERD (gastroesophageal reflux disease)   . Hypertension   . Stroke [facility])     Patient Active Problem List   Diagnosis Date Noted  . Fever 11/27/2012  . Dehydration 11/27/2012  . UTI (lower urinary tract infection) 11/27/2012    Past Surgical History:  Procedure Laterality Date  . BREAST SURGERY       OB History   No obstetric history on file.     History reviewed. No pertinent family history.  Social History   Tobacco Use  . Smoking status: Never Smoker  . Smokeless tobacco: Never Used  Vaping Use  . Vaping Use: Never used  Substance Use Topics  . Alcohol use: No  . Drug use: No    Home Medications Prior to Admission medications   Medication Sig Start Date End Date Taking? Authorizing Provider  azithromycin (ZITHROMAX) 250 MG tablet Take 1 pill daily until gone 02/08/15   Leonard Schwartz, MD  hydrochlorothiazide (HYDRODIURIL) 25 MG tablet Take 25 mg by mouth  daily.    [provider]  HYDROcodone-homatropine (HYCODAN) 5-1.5 MG/5ML syrup Take 5 mLs by mouth every 6 (six) hours as needed for cough. 02/08/15   Leonard Schwartz, MD  LORazepam (ATIVAN) 1 MG tablet Take 1 mg by mouth every 8 (eight) hours.    [provider]  metoprolol succinate (TOPROL-XL) 25 MG 24 hr tablet Take 25 mg by mouth daily.    [provider]  ondansetron (ZOFRAN) 4 MG tablet Take 1 tablet (4 mg total) by mouth every 6 (six) hours. 03/16/19   Alveria Apley, PA-C  pantoprazole (PROTONIX) 40 MG tablet Take 40 mg by mouth daily.    [provider]    Allergies    Aleve [naproxen] and Ibuprofen  Review of Systems   Review of Systems  Unable to perform ROS: Acuity of condition  Constitutional: Negative for fever.  Eyes: Negative for visual disturbance.  Respiratory: Negative for shortness of breath.   Cardiovascular: Negative for chest pain.  Gastrointestinal: Negative for abdominal pain.  Musculoskeletal: Positive for arthralgias.  Neurological: Positive for weakness. Negative for speech difficulty and headaches.    Physical Exam Updated Vital Signs Ht 5' 4.5" (1.638 m)   Wt 83.2 kg   BMI 31.00 kg/m   Physical Exam Vitals and nursing note reviewed.  Constitutional:      Appearance: Normal appearance. She is not diaphoretic.  HENT:  Head: Normocephalic and atraumatic.     Nose: Nose normal.     Mouth/Throat:     Mouth: Mucous membranes are moist.     Pharynx: Oropharynx is clear.  Eyes:     Extraocular Movements: Extraocular movements intact.     Conjunctiva/sclera: Conjunctivae normal.     Pupils: Pupils are equal, round, and reactive to light.  Cardiovascular:     Rate and Rhythm: Normal rate and regular rhythm.     Pulses: Normal pulses.  Pulmonary:     Effort: Pulmonary effort is normal.     Breath sounds: Normal breath sounds.  Abdominal:     General: Abdomen is flat. Bowel sounds are normal.     Palpations:  Abdomen is soft.     Tenderness: There is no abdominal tenderness. There is no guarding.  Musculoskeletal:     Cervical back: Normal range of motion and neck supple.     Right lower leg: No edema.     Left lower leg: No edema.  Lymphadenopathy:     Cervical: No cervical adenopathy.  Skin:    General: Skin is warm and dry.     Capillary Refill: Capillary refill takes less than 2 seconds.  Neurological:     Mental Status: She is alert.     Cranial Nerves: No cranial nerve deficit.     Comments: upgoing Babinski on the right   Psychiatric:        Thought Content: Thought content normal.     ED Results / Procedures / Treatments   Labs (all labs ordered are listed, but only abnormal results are displayed) Results for orders placed or performed during the hospital encounter of 03/20/20  Protime-INR  Result Value Ref Range   Prothrombin Time 12.3 11.4 - 15.2 seconds   INR 1.0 0.8 - 1.2  APTT  Result Value Ref Range   aPTT 25 24 - 36 seconds  CBC  Result Value Ref Range   WBC 6.8 4.0 - 10.5 K/uL   RBC 4.29 3.87 - 5.11 MIL/uL   Hemoglobin 11.7 (L) 12.0 - 15.0 g/dL   HCT 37.6 36.0 - 46.0 %   MCV 87.6 80.0 - 100.0 fL   MCH 27.3 26.0 - 34.0 pg   MCHC 31.1 30.0 - 36.0 g/dL   RDW 16.3 (H) 11.5 - 15.5 %   Platelets 201 150 - 400 K/uL   nRBC 0.0 0.0 - 0.2 %  Differential  Result Value Ref Range   Neutrophils Relative % 43 %   Neutro Abs 2.9 1.7 - 7.7 K/uL   Lymphocytes Relative 43 %   Lymphs Abs 3.0 0.7 - 4.0 K/uL   Monocytes Relative 13 %   Monocytes Absolute 0.9 0.1 - 1.0 K/uL   Eosinophils Relative 1 %   Eosinophils Absolute 0.1 0.0 - 0.5 K/uL   Basophils Relative 0 %   Basophils Absolute 0.0 0.0 - 0.1 K/uL   Immature Granulocytes 0 %   Abs Immature Granulocytes 0.01 0.00 - 0.07 K/uL  I-stat chem 8, ED  Result Value Ref Range   Sodium 141 135 - 145 mmol/L   Potassium 3.3 (L) 3.5 - 5.1 mmol/L   Chloride 104 98 - 111 mmol/L   BUN 8 8 - 23 mg/dL   Creatinine, Ser 0.70  0.44 - 1.00 mg/dL   Glucose, Bld 143 (H) 70 - 99 mg/dL   Calcium, Ion 1.06 (L) 1.15 - 1.40 mmol/L   TCO2 24 22 - 32 mmol/L  Hemoglobin 12.6 12.0 - 15.0 g/dL   HCT 37.0 36.0 - 46.0 %  CBG monitoring, ED  Result Value Ref Range   Glucose-Capillary 123 (H) 70 - 99 mg/dL   CT HEAD CODE STROKE WO CONTRAST  Result Date: 03/20/2020 CLINICAL DATA:  Code stroke.  Bilateral arm and leg weakness EXAM: CT HEAD WITHOUT CONTRAST TECHNIQUE: Contiguous axial images were obtained from the base of the skull through the vertex without intravenous contrast. COMPARISON:  None. FINDINGS: Brain: There is no mass, hemorrhage or extra-axial collection. The size and configuration of the ventricles and extra-axial CSF spaces are normal. Dense basal ganglia mineralization. Old left thalamic small vessel infarct. There is periventricular hypoattenuation compatible with chronic microvascular disease. Vascular: Atherosclerotic calcification of the internal carotid arteries at the skull base. No abnormal hyperdensity of the major intracranial arteries or dural venous sinuses. Skull: The visualized skull base, calvarium and extracranial soft tissues are normal. Sinuses/Orbits: No fluid levels or advanced mucosal thickening of the visualized paranasal sinuses. No mastoid or middle ear effusion. The orbits are normal. ASPECTS [facility] Stroke Program Early CT Score) - Ganglionic level infarction (caudate, lentiform nuclei, internal capsule, insula, M1-M3 cortex): 7 - Supraganglionic infarction (M4-M6 cortex): 3 Total score (0-10 with 10 being normal): 10 IMPRESSION: 1. No acute intracranial abnormality. 2. ASPECTS is 10. 3. Chronic microvascular ischemia and old left thalamic small vessel infarct. These results were communicated to Dr. Lesleigh Noe at 1:22 am on 03/20/2020 by text page via the [facility] messaging system. Electronically Signed   By: Ulyses Jarred M.D.   On: 03/20/2020 01:22    EKG  Date: 03/20/2020  Rate: 60  Rhythm:  normal sinus rhythm  QRS Axis: normal  Intervals: normal  ST/T Wave abnormalities: non specific ST T changes   Conduction Disutrbances: none  Narrative Interpretation: non specific ST T changes     Radiology CT HEAD CODE STROKE WO CONTRAST  Result Date: 03/20/2020 CLINICAL DATA:  Code stroke.  Bilateral arm and leg weakness EXAM: CT HEAD WITHOUT CONTRAST TECHNIQUE: Contiguous axial images were obtained from the base of the skull through the vertex without intravenous contrast. COMPARISON:  None. FINDINGS: Brain: There is no mass, hemorrhage or extra-axial collection. The size and configuration of the ventricles and extra-axial CSF spaces are normal. Dense basal ganglia mineralization. Old left thalamic small vessel infarct. There is periventricular hypoattenuation compatible with chronic microvascular disease. Vascular: Atherosclerotic calcification of the internal carotid arteries at the skull base. No abnormal hyperdensity of the major intracranial arteries or dural venous sinuses. Skull: The visualized skull base, calvarium and extracranial soft tissues are normal. Sinuses/Orbits: No fluid levels or advanced mucosal thickening of the visualized paranasal sinuses. No mastoid or middle ear effusion. The orbits are normal. ASPECTS [facility] Stroke Program Early CT Score) - Ganglionic level infarction (caudate, lentiform nuclei, internal capsule, insula, M1-M3 cortex): 7 - Supraganglionic infarction (M4-M6 cortex): 3 Total score (0-10 with 10 being normal): 10 IMPRESSION: 1. No acute intracranial abnormality. 2. ASPECTS is 10. 3. Chronic microvascular ischemia and old left thalamic small vessel infarct. These results were communicated to Dr. Lesleigh Noe at 1:22 am on 03/20/2020 by text page via the [facility] messaging system. Electronically Signed   By: Ulyses Jarred M.D.   On: 03/20/2020 01:22    Procedures Procedures   Medications Ordered in ED Medications  clevidipine (CLEVIPREX) infusion 0.5  mg/mL (has no administration in time range)  clevidipine (CLEVIPREX) 0.5 MG/ML infusion (has no administration in time range)  alteplase (ACTIVASE)  1 mg/mL infusion 74.9 mg (has no administration in time range)    Followed by  0.9 %  sodium chloride infusion (has no administration in time range)  iohexol (OMNIPAQUE) 350 MG/ML injection 75 mL (75 mLs Intravenous Contrast Given 03/20/20 0119)  labetalol (NORMODYNE) injection 10-20 mg (10 mg Intravenous Given 03/20/20 0140)    ED Course  I have reviewed the triage vital signs and the nursing notes.  Pertinent labs & imaging results that were available during my care of the patient were reviewed by me and considered in my medical decision making (see chart for details).    MDM Reviewed: previous chart, nursing note and vitals Reviewed previous: labs Interpretation: labs, ECG and CT scan Total time providing critical care: 30-74 minutes. This excludes time spent performing separately reportable procedures and services. Consults: neurology  CRITICAL CARE Performed by: Elvyn Krohn K Jerrick Farve-Rasch Total critical care time: 60 minutes Critical care time was exclusive of separately billable procedures and treating other patients. Critical care was necessary to treat or prevent imminent or life-threatening deterioration. Critical care was time spent personally by me on the following activities: development of treatment plan with patient and/or surrogate as well as nursing, discussions with consultants, evaluation of patient's response to treatment, examination of patient, obtaining history from patient or surrogate, ordering and performing treatments and interventions, ordering and review of laboratory studies, ordering and review of radiographic studies, pulse oximetry and re-evaluation of patient's condition.  Deborah [name] was evaluated in Emergency Department on 03/20/2020 for the symptoms described in the history of present illness. She was evaluated  in the context of the global COVID-19 pandemic, which necessitated consideration that the patient might be at risk for infection with the SARS-CoV-2 virus that causes COVID-19. Institutional protocols and algorithms that pertain to the evaluation of patients at risk for COVID-19 are in a state of rapid change based on information released by regulatory bodies including the CDC and federal and state organizations. These policies and algorithms were followed during the patient's care in the ED. Final Clinical Impression(s) / ED Diagnoses Admit for vertebral artery dissection    Ferd Horrigan, MD 03/20/20 4920

## 2020-03-20 NOTE — Consult Note (Signed)
Neurology Consultation Reason for Consult: Code stroke Requesting Physician: April Palumbo   CC: Generalized weakness, left neck pain  History is obtained from: Patient and chart review  HPI: Deborah [name] is a 65 y.o. 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  She reports she was in her usual state of health when she began to have left-sided neck pain while sitting and watching TV.  This was then associated with dizziness, pain all over her body and generalized weakness.  On EMS evaluation this was slightly worse on the left than the right and therefore code stroke was activated  LKW: Midnight tPA given?:  Yes due to within the window and potential for vertebral artery occlusion leading to some potential posterior circulation symptoms IA performed?: No, good distal reconstitution of vertebral blood flow Premorbid modified rankin scale:      1 - No significant disability. Able to carry out all usual activities, despite some symptoms.  ROS: All other review of systems was negative except as noted in the HPI.  Specifically, she denied any recent procedures, bleeding, fevers, chills, sweats  Past Medical History:  Diagnosis Date  . Cancer ([place])   . GERD (gastroesophageal reflux disease)   . Hypertension   . Stroke [facility])    Current Outpatient Medications  Medication Instructions  . azithromycin (ZITHROMAX) 250 MG tablet Take 1 pill daily until gone  . hydrochlorothiazide (HYDRODIURIL) 25 mg, Daily  . HYDROcodone-homatropine (HYCODAN) 5-1.5 MG/5ML syrup 5 mLs, Oral, Every 6 hours PRN  . LORazepam (ATIVAN) 1 mg, Every 8 hours  . metoprolol succinate (TOPROL-XL) 25 mg, Daily  . ondansetron (ZOFRAN) 4 mg, Oral, Every 6 hours  . pantoprazole (PROTONIX) 40 mg, Daily    History reviewed. No pertinent family history.   Social History:  reports that she has never smoked. She  has never used smokeless tobacco. She reports that she does not drink alcohol and does not use drugs.   Exam: Current vital signs: Ht 5' 4.5" (1.638 m)   Wt 83.2 kg   BMI 31.00 kg/m  Vital signs in last 24 hours: Weight:  [83.2 kg] 83.2 kg (02/22 0100)   Physical Exam  Constitutional: Appears well-developed and well-nourished.  Psych: Affect flat, occasionally somewhat anxious Eyes: No scleral injection HENT: No oropharyngeal obstruction.  MSK: no joint deformities.  Cardiovascular: Normal rate and regular rhythm.  Respiratory: Effort normal, non-labored breathing GI: Soft.  No distension. There is no tenderness.  Skin: Warm dry and intact visible skin  Neuro: Mental Status: Patient is awake, alert, oriented to person, place, month, year, and situation, though she generally speaks very slowly / hesitantly  Patient is able to give a clear and coherent history. Cranial Nerves: II: Visual Fields are full. Pupils are equal, round, and reactive to light.   III,IV, VI: EOMI without ptosis or diploplia.  V: Facial sensation is symmetric to light touch VII: Facial movement is symmetric.  VIII: hearing is intact to voice X: Uvula elevates symmetrically XI: Shoulder shrug is symmetric. XII: tongue is midline without atrophy or fasciculations.  Motor: Tone is normal. Bulk is normal.  She had poor effort throughout all 4 extremities but did appear to have somewhat worsened weakness of the left upper extremity in particular within limits of her effort.  Sensory: She withdraws equally but very slightly to noxious stimulation in all 4 extremities Deep Tendon Reflexes: 2+ and symmetric in the biceps.  Plantars: Toes are mute bilaterally. Cerebellar: Unable to perform due to weakness  NIHSS total 12 Score breakdown: 3 points for weakness in each of her limbs   I have reviewed labs in epic and the results pertinent to this consultation are: Creatinine 0.7  I have reviewed the  images obtained: Head CT without acute intracranial process CTA concerning for a left vertebral occlusion at the origin, with reconstitution of the V4 segment due to collateral flow.  This was not present on prior imaging from 2000. 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   Impression: This is a patient with vascular risk factors of prior strokes, hypertension, presenting with left-sided neck pain concerning for possible left vertebral dissection.  Her examination is challenging as there does not appear to be a significant effort dependent aspect.  However given new CTA findings and acuity of the onset of her symptoms, it was felt reasonable to treat the patient with tPA for presumed stroke  Recommendations:  #Left vertebral occlusion of unclear etiology - CT head - CTA head and neck to rule out dissection / posterior circulation stroke given generalized weakness and neck pain - MRI brain when able - Hemoglobin A1c, lipid panel - Echocardiogram - Dual antiplatelet therapy if patient remains stable 24 hours after TPA administration - PT/OT/SLP evaluation   Lesleigh Noe MD-PhD [facility] [phone]  Total critical care time: 55 minutes   Critical care time was exclusive of separately billable procedures and treating other patients.   Critical care was necessary to treat or prevent imminent or life-threatening deterioration.   Critical care was time spent personally by me on the following activities: development of treatment plan with patient and/or surrogate as well as nursing, discussions with consultants/primary team, evaluation of patient's response to treatment, examination of patient, obtaining history from patient or surrogate, ordering and performing treatments and interventions, ordering and review of laboratory studies, ordering and review of radiographic studies, and re-evaluation of patient's condition as needed, as documented above.

## 2020-03-20 NOTE — H&P (Signed)
Please see my consult note dated 2/22 for H&P

## 2020-03-20 NOTE — Progress Notes (Addendum)
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  She reports she was in her usual state of health when she began to have left-sided neck pain while sitting and watching TV.  This was then associated with dizziness, pain all over her body and generalized weakness.  On EMS evaluation this was slightly worse on the left than the right and therefore code stroke was activated.  She was given tPA at 147pm.    On exam this morning, she was awake, alert and oriented x3. PERRL, EOMI, face symmetric, follows commands x 4, left upper extremity 4-, right upper extremities 4/5, bilateral lower extremities 3/5.  Generalized pain throughout, decreased sensation on the left.     Vitals:   03/20/20 0830 03/20/20 0900 03/20/20 0930 03/20/20 1000  BP: (!) 142/78 (!) 150/80 (!) 152/85 (!) 157/78  Pulse: 69 68 70 66  Resp: 15 16 17 17   Temp:      TempSrc:      SpO2: 100% 100% 100% 100%  Weight:      Height:       CBC:  Recent Labs  Lab 03/20/20 0102 03/20/20 0114  WBC 6.8  --   NEUTROABS 2.9  --   HGB 11.7* 12.6  HCT 37.6 37.0  MCV 87.6  --   PLT 201  --    Basic Metabolic Panel:  Recent Labs  Lab 03/20/20 0102 03/20/20 0114  NA 137 141  K 3.3* 3.3*  CL 104 104  CO2 23  --   GLUCOSE 144* 143*  BUN 7* 8  CREATININE 0.81 0.70  CALCIUM 8.8*  --    Lipid Panel:  Recent Labs  Lab 03/20/20 0507  CHOL 222*  TRIG 72  HDL 52  CHOLHDL 4.3  VLDL 14  LDLCALC 156*   HgbA1c:  Recent Labs  Lab 03/20/20 0507  HGBA1C 6.5*   Urine Drug Screen:  Recent Labs  Lab 03/20/20 0102  LABOPIA NONE DETECTED  COCAINSCRNUR NONE DETECTED  LABBENZ NONE DETECTED  AMPHETMU NONE DETECTED  THCU NONE DETECTED  LABBARB NONE DETECTED    Alcohol Level  Recent Labs  Lab 03/20/20 0102  ETH <10    IMAGING past 24 hours CT Code Stroke CTA Head W/WO contrast  Result  Date: 03/20/2020 CLINICAL DATA:  Generalized weakness EXAM: CT ANGIOGRAPHY HEAD AND NECK TECHNIQUE: Multidetector CT imaging of the head and neck was performed using the standard protocol during bolus administration of intravenous contrast. Multiplanar CT image reconstructions and MIPs were obtained to evaluate the vascular anatomy. Carotid stenosis measurements (when applicable) are obtained utilizing NASCET criteria, using the distal internal carotid diameter as the denominator. CONTRAST:  36mL OMNIPAQUE IOHEXOL 350 MG/ML SOLN COMPARISON:  None. FINDINGS: CTA NECK FINDINGS SKELETON: There is no bony spinal canal stenosis. No lytic or blastic lesion. OTHER NECK: Normal pharynx, larynx and major salivary glands. No cervical lymphadenopathy. Unremarkable thyroid gland. UPPER CHEST: Bandlike atelectasis in the left upper lobe. AORTIC ARCH: There is no calcific atherosclerosis of the aortic arch. There is no aneurysm, dissection or hemodynamically significant stenosis of the visualized portion of the aorta. Conventional 3 vessel aortic branching pattern. The visualized proximal subclavian arteries are widely patent. RIGHT CAROTID SYSTEM: No dissection, occlusion or aneurysm. There is low density atherosclerosis extending into the proximal ICA, resulting in 50% stenosis. LEFT CAROTID SYSTEM: No dissection, occlusion or aneurysm. Mild atherosclerotic calcification  at the carotid bifurcation without hemodynamically significant stenosis. VERTEBRAL ARTERIES: Codominant configuration. Left vertebral artery is occluded at its origin. Right vertebral artery is normal to the vertebrobasilar confluence. CTA HEAD FINDINGS POSTERIOR CIRCULATION: --Vertebral arteries: There is opacification of the distal left V4 segment due to collateral flow across the vertebrobasilar confluence. --Inferior cerebellar arteries: Right PICA is patent but the left PICA is occluded. --Basilar artery: Normal. --Superior cerebellar arteries: Normal.  --Posterior cerebral arteries (PCA): Normal. ANTERIOR CIRCULATION: --Intracranial internal carotid arteries: Normal. --Anterior cerebral arteries (ACA): Normal. Both A1 segments are present. Patent anterior communicating artery (a-comm). --Middle cerebral arteries (MCA): Normal. VENOUS SINUSES: As permitted by contrast timing, patent. ANATOMIC VARIANTS: None Review of the MIP images confirms the above findings. IMPRESSION: 1. No intracranial arterial occlusion or high-grade stenosis. 2. Occlusion of the left vertebral artery at its origin with reconstitution of the distal left V4 segment due to collateral flow across the vertebrobasilar confluence. 3. Approximately 50% stenosis of the proximal right internal carotid artery secondary to low density atherosclerosis. Critical Value/emergent results were called by telephone at the time of interpretation on 03/20/2020 at 1:30 am to provider [facility] , who verbally acknowledged these results. Electronically Signed   By: Ulyses Jarred M.D.   On: 03/20/2020 01:41   CT Code Stroke CTA Neck W/WO contrast  Result Date: 03/20/2020 CLINICAL DATA:  Generalized weakness EXAM: CT ANGIOGRAPHY HEAD AND NECK TECHNIQUE: Multidetector CT imaging of the head and neck was performed using the standard protocol during bolus administration of intravenous contrast. Multiplanar CT image reconstructions and MIPs were obtained to evaluate the vascular anatomy. Carotid stenosis measurements (when applicable) are obtained utilizing NASCET criteria, using the distal internal carotid diameter as the denominator. CONTRAST:  85mL OMNIPAQUE IOHEXOL 350 MG/ML SOLN COMPARISON:  None. FINDINGS: CTA NECK FINDINGS SKELETON: There is no bony spinal canal stenosis. No lytic or blastic lesion. OTHER NECK: Normal pharynx, larynx and major salivary glands. No cervical lymphadenopathy. Unremarkable thyroid gland. UPPER CHEST: Bandlike atelectasis in the left upper lobe. AORTIC ARCH: There is no calcific  atherosclerosis of the aortic arch. There is no aneurysm, dissection or hemodynamically significant stenosis of the visualized portion of the aorta. Conventional 3 vessel aortic branching pattern. The visualized proximal subclavian arteries are widely patent. RIGHT CAROTID SYSTEM: No dissection, occlusion or aneurysm. There is low density atherosclerosis extending into the proximal ICA, resulting in 50% stenosis. LEFT CAROTID SYSTEM: No dissection, occlusion or aneurysm. Mild atherosclerotic calcification at the carotid bifurcation without hemodynamically significant stenosis. VERTEBRAL ARTERIES: Codominant configuration. Left vertebral artery is occluded at its origin. Right vertebral artery is normal to the vertebrobasilar confluence. CTA HEAD FINDINGS POSTERIOR CIRCULATION: --Vertebral arteries: There is opacification of the distal left V4 segment due to collateral flow across the vertebrobasilar confluence. --Inferior cerebellar arteries: Right PICA is patent but the left PICA is occluded. --Basilar artery: Normal. --Superior cerebellar arteries: Normal. --Posterior cerebral arteries (PCA): Normal. ANTERIOR CIRCULATION: --Intracranial internal carotid arteries: Normal. --Anterior cerebral arteries (ACA): Normal. Both A1 segments are present. Patent anterior communicating artery (a-comm). --Middle cerebral arteries (MCA): Normal. VENOUS SINUSES: As permitted by contrast timing, patent. ANATOMIC VARIANTS: None Review of the MIP images confirms the above findings. IMPRESSION: 1. No intracranial arterial occlusion or high-grade stenosis. 2. Occlusion of the left vertebral artery at its origin with reconstitution of the distal left V4 segment due to collateral flow across the vertebrobasilar confluence. 3. Approximately 50% stenosis of the proximal right internal carotid artery secondary to low density atherosclerosis. Critical Value/emergent  results were called by telephone at the time of interpretation on 03/20/2020  at 1:30 am to provider [facility] , who verbally acknowledged these results. Electronically Signed   By: Ulyses Jarred M.D.   On: 03/20/2020 01:41   MR BRAIN WO CONTRAST  Result Date: 03/20/2020 CLINICAL DATA:  65 year old female status post code stroke presentation this morning. Extremity weakness. EXAM: MRI HEAD WITHOUT CONTRAST MRI CERVICAL SPINE WITHOUT CONTRAST TECHNIQUE: Multiplanar, multiecho pulse sequences of the brain and surrounding structures, and cervical spine, to include the craniocervical junction and cervicothoracic junction, were obtained without intravenous contrast. COMPARISON:  Head CT, CTA head and neck earlier today. [facility] brain MRI 12/13/2019. CT cervical spine 12/24/2017. FINDINGS: MRI HEAD FINDINGS Brain: No restricted diffusion or evidence of acute infarction. Advanced bilateral cerebral white matter T2 and FLAIR hyperintensity which is Patchy and confluent in both hemispheres. Bilateral deep white matter capsule involvement. Superimposed chronic lacunar infarcts in the thalami, larger on the left (series 10, image 14). T2 and FLAIR heterogeneity throughout the bilateral basal ganglia, and in the pons. Numerous chronic microhemorrhages in the deep gray nuclei, especially the thalami. Scattered hemispheric and left cerebellar chronic microhemorrhage elsewhere. Stable SWI since November. No definite cortical encephalomalacia. No midline shift, mass effect, evidence of mass lesion, ventriculomegaly, extra-axial collection or acute intracranial hemorrhage. Cervicomedullary junction and pituitary are within normal limits. Vascular: Major intracranial vascular flow voids are stable since November, preserved. Skull and upper cervical spine: Cervical spine detailed below. Visualized bone marrow signal is within normal limits. Sinuses/Orbits: Stable, and negative aside from chronic right orbital floor fracture with small volume herniated fat.  Other: Trace mastoid fluid on the right is stable. Negative visible nasopharynx. MRI CERVICAL SPINE FINDINGS Alignment: Preserved cervical lordosis.  No spondylolisthesis. Vertebrae: No marrow edema or evidence of acute osseous abnormality. Visualized bone marrow signal is within normal limits. Cord: Abnormal patchy but fairly symmetric T2 and STIR hyperintense signal in the ventral cord centered at C4 (series 24, image 8). This has indistinct margins, but on axial images localizes to the ventral cord in a fairly symmetric fashion (series 26, image 14). This seems somewhat eccentric to the central cord gray matter. No cord volume loss is associated. Above and below that level the visible spinal cord appears normal. Posterior Fossa, vertebral arteries, paraspinal tissues: Left vertebral artery flow void is lost in the neck in keeping with the CTA findings today. Cervicomedullary junction is within normal limits. Negative visible neck soft tissues and lung apices. Disc levels: C2-C3: Mild foraminal disc osteophyte complex. Mild facet hypertrophy. No spinal stenosis. Mild to moderate right C3 foraminal stenosis. C3-C4: Foraminal disc osteophyte complex primarily on the right. No spinal stenosis. Moderate to severe right C4 foraminal stenosis. C4-C5: Minimal disc bulge and endplate spurring primarily at the foramen. No spinal stenosis. Mild to moderate right C5 foraminal stenosis. C5-C6: Minimal disc bulge and endplate spurring. Mild facet and ligament flavum hypertrophy. No spinal stenosis. Mild bilateral C6 foraminal stenosis. C6-C7: Minimal disc bulge and endplate spurring. Mild facet and ligament flavum hypertrophy. No significant stenosis. C7-T1:  Mild facet and ligament flavum hypertrophy.  No stenosis. Mild dorsal upper thoracic epidural lipomatosis. IMPRESSION: 1. Abnormal signal in the ventral cervical spinal cord centered at the lower C4 level. This does not appear related to any compressive myelopathy, and  although the margins are indistinct a cord neoplasm is felt unlikely. Inflammatory or possibly ischemic (see #2) etiology are favored over other considerations.  Follow-up post-contrast cervical spine imaging recommended to further characterize. 2. Severe intracranial small vessel disease appears stable since November, including numerous chronic microhemorrhages in the thalami. No acute intracranial abnormality. 3. Generally mild for age cervical spine degeneration, with no significant spinal stenosis. However, there is multilevel moderate and occasionally severe right side cervical neural foraminal stenosis. Electronically Signed   By: Genevie Ann M.D.   On: 03/20/2020 07:21   MR CERVICAL SPINE WO CONTRAST  Result Date: 03/20/2020 CLINICAL DATA:  65 year old female status post code stroke presentation this morning. Extremity weakness. EXAM: MRI HEAD WITHOUT CONTRAST MRI CERVICAL SPINE WITHOUT CONTRAST TECHNIQUE: Multiplanar, multiecho pulse sequences of the brain and surrounding structures, and cervical spine, to include the craniocervical junction and cervicothoracic junction, were obtained without intravenous contrast. COMPARISON:  Head CT, CTA head and neck earlier today. [facility] brain MRI 12/13/2019. CT cervical spine 12/24/2017. FINDINGS: MRI HEAD FINDINGS Brain: No restricted diffusion or evidence of acute infarction. Advanced bilateral cerebral white matter T2 and FLAIR hyperintensity which is Patchy and confluent in both hemispheres. Bilateral deep white matter capsule involvement. Superimposed chronic lacunar infarcts in the thalami, larger on the left (series 10, image 14). T2 and FLAIR heterogeneity throughout the bilateral basal ganglia, and in the pons. Numerous chronic microhemorrhages in the deep gray nuclei, especially the thalami. Scattered hemispheric and left cerebellar chronic microhemorrhage elsewhere. Stable SWI since November. No definite cortical  encephalomalacia. No midline shift, mass effect, evidence of mass lesion, ventriculomegaly, extra-axial collection or acute intracranial hemorrhage. Cervicomedullary junction and pituitary are within normal limits. Vascular: Major intracranial vascular flow voids are stable since November, preserved. Skull and upper cervical spine: Cervical spine detailed below. Visualized bone marrow signal is within normal limits. Sinuses/Orbits: Stable, and negative aside from chronic right orbital floor fracture with small volume herniated fat. Other: Trace mastoid fluid on the right is stable. Negative visible nasopharynx. MRI CERVICAL SPINE FINDINGS Alignment: Preserved cervical lordosis.  No spondylolisthesis. Vertebrae: No marrow edema or evidence of acute osseous abnormality. Visualized bone marrow signal is within normal limits. Cord: Abnormal patchy but fairly symmetric T2 and STIR hyperintense signal in the ventral cord centered at C4 (series 24, image 8). This has indistinct margins, but on axial images localizes to the ventral cord in a fairly symmetric fashion (series 26, image 14). This seems somewhat eccentric to the central cord gray matter. No cord volume loss is associated. Above and below that level the visible spinal cord appears normal. Posterior Fossa, vertebral arteries, paraspinal tissues: Left vertebral artery flow void is lost in the neck in keeping with the CTA findings today. Cervicomedullary junction is within normal limits. Negative visible neck soft tissues and lung apices. Disc levels: C2-C3: Mild foraminal disc osteophyte complex. Mild facet hypertrophy. No spinal stenosis. Mild to moderate right C3 foraminal stenosis. C3-C4: Foraminal disc osteophyte complex primarily on the right. No spinal stenosis. Moderate to severe right C4 foraminal stenosis. C4-C5: Minimal disc bulge and endplate spurring primarily at the foramen. No spinal stenosis. Mild to moderate right C5 foraminal stenosis. C5-C6:  Minimal disc bulge and endplate spurring. Mild facet and ligament flavum hypertrophy. No spinal stenosis. Mild bilateral C6 foraminal stenosis. C6-C7: Minimal disc bulge and endplate spurring. Mild facet and ligament flavum hypertrophy. No significant stenosis. C7-T1:  Mild facet and ligament flavum hypertrophy.  No stenosis. Mild dorsal upper thoracic epidural lipomatosis. IMPRESSION: 1. Abnormal signal in the ventral cervical spinal cord centered at the lower C4 level. This  does not appear related to any compressive myelopathy, and although the margins are indistinct a cord neoplasm is felt unlikely. Inflammatory or possibly ischemic (see #2) etiology are favored over other considerations. Follow-up post-contrast cervical spine imaging recommended to further characterize. 2. Severe intracranial small vessel disease appears stable since November, including numerous chronic microhemorrhages in the thalami. No acute intracranial abnormality. 3. Generally mild for age cervical spine degeneration, with no significant spinal stenosis. However, there is multilevel moderate and occasionally severe right side cervical neural foraminal stenosis. Electronically Signed   By: Genevie Ann M.D.   On: 03/20/2020 07:21   CT HEAD CODE STROKE WO CONTRAST  Result Date: 03/20/2020 CLINICAL DATA:  Code stroke.  Bilateral arm and leg weakness EXAM: CT HEAD WITHOUT CONTRAST TECHNIQUE: Contiguous axial images were obtained from the base of the skull through the vertex without intravenous contrast. COMPARISON:  None. FINDINGS: Brain: There is no mass, hemorrhage or extra-axial collection. The size and configuration of the ventricles and extra-axial CSF spaces are normal. Dense basal ganglia mineralization. Old left thalamic small vessel infarct. There is periventricular hypoattenuation compatible with chronic microvascular disease. Vascular: Atherosclerotic calcification of the internal carotid arteries at the skull base. No abnormal  hyperdensity of the major intracranial arteries or dural venous sinuses. Skull: The visualized skull base, calvarium and extracranial soft tissues are normal. Sinuses/Orbits: No fluid levels or advanced mucosal thickening of the visualized paranasal sinuses. No mastoid or middle ear effusion. The orbits are normal. ASPECTS [facility] Stroke Program Early CT Score) - Ganglionic level infarction (caudate, lentiform nuclei, internal capsule, insula, M1-M3 cortex): 7 - Supraganglionic infarction (M4-M6 cortex): 3 Total score (0-10 with 10 being normal): 10 IMPRESSION: 1. No acute intracranial abnormality. 2. ASPECTS is 10. 3. Chronic microvascular ischemia and old left thalamic small vessel infarct. These results were communicated to Dr. Lesleigh Noe at 1:22 am on 03/20/2020 by text page via the [facility] messaging system. Electronically Signed   By: Ulyses Jarred M.D.   On: 03/20/2020 01:22    PHYSICAL EXAM Physical Exam  Constitutional: Appears well-developed and well-nourished.  Psych: Affect flat, occasionally somewhat anxious Eyes: No scleral injection HENT: No oropharyngeal obstruction.  MSK: no joint deformities.  Cardiovascular: Normal rate and regular rhythm.  Respiratory: Effort normal, non-labored breathing GI: Soft.  No distension. There is no tenderness.  Skin: Warm dry and intact visible skin  Neuro: Mental Status: Patient is awake, alert, oriented to person, place, month, year, and situation, though she generally speaks very slowly / hesitantly  Patient is able to give a clear and coherent history. Cranial Nerves: II: Visual Fields are full. Pupils are equal, round, and reactive to light.   III,IV, VI: EOMI without ptosis or diploplia.  V: Facial sensation is symmetric to light touch VII: Facial movement is symmetric.  VIII: hearing is intact to voice X: Uvula elevates symmetrically XI: Shoulder shrug is symmetric. XII: tongue is midline without atrophy or fasciculations.   Motor: Tone is normal. Bulk is normal.  LUE 4- RUE 4 LLE 3 RLE 3 Sensory: Decrease sensation in the left upper extremities Deep Tendon Reflexes: 2+ and symmetric in the biceps.  ASSESSMENT/PLAN [name] is a 66 y.o. female with history of breast cancer, HTN, prior strokes presenting with generalized weakness, dizziness, and pain. Concern for stroke. CT head no acute abnormalities or hemorrhage.  TPA given on 03/20/2020 at 1:47am     Cervical cord ischemia vs. demyelination   Code Stroke CT head No acute abnormality. ASPECTS  10    CTA head & neck: Occlusion of the left vertebral artery at its origin with reconstitution of the distal left V4 segment. Approximately 50% stenosis of the proximal right ICA secondary to low density atherosclerosis.  MRI C-spine wo - Abnormal signal in the ventral cervical spinal cord centered at the lower C4 level  MRI C-spine w contrast pending  MRI brain: Severe intracranial small vessel disease   CT head: 24 hour post TPA pending  2D Echo EF 60-65%  LDL 156  HgbA1c 6.5  VTE prophylaxis - holding until 24 hours CT head obtain and no hemorrhage identified  Heart healthy diet  No antithrombotic prior to admission, will order  aspirin 81 mg daily and clopidogrel 75 mg daily  24 hours after TPA administration if CT no bleeding.  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 Acute  s/p tPA  Hx of ? Stroke  2- 3 years ago with b/l hand and feet numbness tingling, whole body weakness. Admitted to outside hospital and underwent rehab  Not sure diagnosis but told could be stroke  Will request outside hospital record  Hypertension  Home meds:  Metoprolol 25mg  daily and hygroton 25  Stable  Off cleviprex  Resume home meds  SBP goal <180 for 24 hours after TPA administration . Long-term BP goal normotensive  Hyperlipidemia  Home meds:  zetia 10mg   daily, resumed in hospital  LDL 156, goal < 70  Add lipitor 40  Continue zetia and statin at discharge  Borderline Diabetes   Home meds: Metformin 500mg  daily  HgbA1c 6.5, goal < 7.0  CBGs  SSI  PCP follow up  Other Stroke Risk Factors  Obesity, Body mass index is 37.03 kg/m., BMI >/= 30 associated with increased stroke risk, recommend weight loss, diet and exercise as appropriate   Other Active Problems  GERD  Breast cancer  Hospital day # 0   ATTENDING NOTE: I reviewed above note and agree with the assessment and plan. Pt was seen and examined.   65 year old female with history of hypertension, breast cancer, questionable history of stroke presented as code stroke.  Per patient, she had ?  Stroke 2-3 years ago.  At that time, she was working at the [employer] office, had acute onset bilateral hand and feet numbness tingling with whole body weakness.  She was sent to hospital admitted for week and then underwent rehab and recovery.  She was told could be a stroke but no details or location was stroke revealed.  Will need to request outside records.  Yesterday, she was sleeping, woke up by severe pain at bilateral neck and bilateral shoulder, she got up try to have a hot shower, however became whole-body weakness, not able to move arm or leg.  EMS called, she was sent to ER.  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  tPA was given.  MRI showed no stroke in the brain, however MRI C-spine without contrast C4 spinal cord anterior portion faint T2 flair signal.  MRI C-spine with contrast showed possible cord ischemia.  EF 60 to 65%, A1c 6.5, LDL 156, UDS negative.  Creatinine 0.70.  On exam, patient lethargic but awake alert follows simple commands.  Fully orientated.  No aphasia, no dysarthria, able to name and repeat.  Cranial nerves intact.  Right upper extremity no drift, left upper extremity drift slowly.  Finger grip left  weaker than the right.   Left lower extremity proximal 2/5, distally 3/5.  Right lower extremity 3/5 proximal and 4/5 distally.  Sensation symmetrical, intact light touch, pinprick, temperature, vibration and proprioception sensation.  Intact finger-to-nose although slow on the left.  DTR 1+ left bicep, otherwise 2+ throughout.  Etiology for patient symptoms not quite clear.  Given her similar symptoms 2 to 3 years ago, and resolved, with current C-spine abnormal signal, concerning for demyelinating disease, however, MRI C-spine not typical for demyelinating focus.  Patient symptoms could also be cord ischemia given left VA occlusion and potentially evolving anterior spinal artery, however no any involvement of cerebellum, making acute VA thrombosis less likely.  At this time, will treat as spinal cord ischemia, start antiplatelet after TPA, and PT/OT.  However, will refer to Dr. Felecia Shelling as outpatient for demyelinating work-up also.  For detailed assessment and plan, please refer to above as I have made changes wherever appropriate.   Deborah Hawking, MD PhD Stroke Neurology 03/20/2020 6:26 PM  This patient is critically ill due to spinal cord ischemia status post TPA, hypertensive urgency and at significant risk of neurological worsening, death form spinal cord infarct extension, respiratory failure, hemorrhagic conversion, hypertensive encephalopathy. This patient's care requires constant monitoring of vital signs, hemodynamics, respiratory and cardiac monitoring, review of multiple databases, neurological assessment, discussion with family, other specialists and medical decision making of high complexity. I spent 45 minutes of neurocritical care time in the care of this patient.   To contact Stroke Continuity provider, please refer to [email or web address] After hours, contact General Neurology

## 2020-03-20 NOTE — ED Triage Notes (Signed)
Patient from home, had sudden onset of left arm pain, moved to right arm and then neck pain.  The pain subsided and began having generalized weakness.  She told EMS she was unable to walk.  She does have left grip weakness compaired to right grip.  No slurred speech, no facial droop.  Patient has had a stroke in the past.  Patient is CAOx4, hypertensive upon arrival.  204/130, 78, 98%RA.

## 2020-03-20 NOTE — Progress Notes (Signed)
*  PRELIMINARY RESULTS* Echocardiogram 2D Echocardiogram has been performed.  Deborah [name] 03/20/2020, 2:38 PM

## 2020-03-20 NOTE — Progress Notes (Signed)
OT Cancellation Note  Patient Details Name: Deborah [name] MRN: [id number] DOB: 1955-05-20   Cancelled Treatment:    Reason Eval/Treat Not Completed: Active bedrest order (TPA administered and on BR until 3:18am 2/23)  [name] 03/20/2020, 8:45 AM   Jesse Sans OTR/L Acute Rehabilitation Services Pager: [phone] Office: [phone]

## 2020-03-20 NOTE — Evaluation (Signed)
Clinical/Bedside Swallow Evaluation Patient Details  Name: Deborah [name] MRN: [id number] Date of Birth: [birth year]  Today's Date: 03/20/2020 Time: SLP Start Time (ACUTE ONLY): 1400 SLP Stop Time (ACUTE ONLY): 1410 SLP Time Calculation (min) (ACUTE ONLY): 10 min  Past Medical History:  Past Medical History:  Diagnosis Date  . Cancer ([place])   . Diabetes mellitus ([place])    A1C 6.5% on 03/20/20  . GERD (gastroesophageal reflux disease)   . Hypertension   . Stroke [facility])    Past Surgical History:  Past Surgical History:  Procedure Laterality Date  . BREAST SURGERY     HPI:  65 y.o. 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 MRI of brain "Abnormal signal in the ventral cervical spinal cord centered at the lower C4 level. Severe intracranial small vessel disease appears stable since November, including numerous chronic microhemorrhages in the  thalami. No acute intracranial abnormality"   Assessment / Plan / Recommendation Clinical Impression  Pts swallow appears within functional limits. Oral motor exam unremarkable. No overt s/sx of aspiration with any PO. Recommend regular diet with thin liquids. No further ST needs identified.  SLP Visit Diagnosis: Dysphagia, unspecified (R13.10)    Aspiration Risk  Mild aspiration risk    Diet Recommendation   Regular, thin liquids   Medication Administration: Whole meds with liquid    Other  Recommendations Oral Care Recommendations: Oral care BID   Follow up Recommendations None      Frequency and Duration   n/a         Prognosis   Good       Swallow Study   General Date of Onset: 03/19/20 HPI: 65 y.o. 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 MRI of brain "Abnormal signal in the ventral cervical spinal cord  centered at the lower C4 level. Severe intracranial small vessel disease appears stable since November, including numerous chronic microhemorrhages in the  thalami. No acute intracranial abnormality" Type of Study: Bedside Swallow Evaluation Previous Swallow Assessment: none on file Diet Prior to this Study: NPO Temperature Spikes Noted: No Respiratory Status: Room air History of Recent Intubation: No Behavior/Cognition: Alert;Cooperative;Pleasant mood Oral Cavity Assessment: Within Functional Limits Oral Care Completed by SLP: No Oral Cavity - Dentition: Adequate natural dentition Vision: Functional for self-feeding Self-Feeding Abilities: Able to feed self Patient Positioning: Upright in bed Baseline Vocal Quality: Normal Volitional Swallow: Able to elicit    Oral/Motor/Sensory Function Overall Oral Motor/Sensory Function: Within functional limits   Ice Chips Ice chips: Within functional limits Presentation: Spoon   Thin Liquid Thin Liquid: Within functional limits Presentation: Cup;Straw    Nectar Thick Nectar Thick Liquid: Not tested   Honey Thick Honey Thick Liquid: Not tested   Puree Puree: Within functional limits Presentation: Self Fed   Solid     Solid: Within functional limits Presentation: [place], CCC-SLP Acute Rehabilitation Services   03/20/2020,4:17 PM

## 2020-03-21 ENCOUNTER — Inpatient Hospital Stay (HOSPITAL_COMMUNITY): Payer: PRIVATE HEALTH INSURANCE

## 2020-03-21 ENCOUNTER — Encounter (HOSPITAL_COMMUNITY): Payer: Self-pay | Admitting: Neurology

## 2020-03-21 DIAGNOSIS — I7774 Dissection of vertebral artery: Secondary | ICD-10-CM

## 2020-03-21 DIAGNOSIS — Z8673 Personal history of transient ischemic attack (TIA), and cerebral infarction without residual deficits: Secondary | ICD-10-CM

## 2020-03-21 DIAGNOSIS — I10 Essential (primary) hypertension: Secondary | ICD-10-CM | POA: Diagnosis not present

## 2020-03-21 DIAGNOSIS — G9511 Acute infarction of spinal cord (embolic) (nonembolic): Principal | ICD-10-CM

## 2020-03-21 DIAGNOSIS — E1165 Type 2 diabetes mellitus with hyperglycemia: Secondary | ICD-10-CM | POA: Diagnosis not present

## 2020-03-21 DIAGNOSIS — E7800 Pure hypercholesterolemia, unspecified: Secondary | ICD-10-CM

## 2020-03-21 DIAGNOSIS — R001 Bradycardia, unspecified: Secondary | ICD-10-CM | POA: Diagnosis present

## 2020-03-21 LAB — CBC
HCT: 38.2 % (ref 36.0–46.0)
Hemoglobin: 12.1 g/dL (ref 12.0–15.0)
MCH: 27.6 pg (ref 26.0–34.0)
MCHC: 31.7 g/dL (ref 30.0–36.0)
MCV: 87.2 fL (ref 80.0–100.0)
Platelets: 224 10*3/uL (ref 150–400)
RBC: 4.38 MIL/uL (ref 3.87–5.11)
RDW: 16.7 % — ABNORMAL HIGH (ref 11.5–15.5)
WBC: 7.3 10*3/uL (ref 4.0–10.5)
nRBC: 0 % (ref 0.0–0.2)

## 2020-03-21 LAB — BASIC METABOLIC PANEL
Anion gap: 8 (ref 5–15)
BUN: 7 mg/dL — ABNORMAL LOW (ref 8–23)
CO2: 25 mmol/L (ref 22–32)
Calcium: 8.8 mg/dL — ABNORMAL LOW (ref 8.9–10.3)
Chloride: 107 mmol/L (ref 98–111)
Creatinine, Ser: 0.96 mg/dL (ref 0.44–1.00)
GFR, Estimated: >60 mL/min (ref >60)
Glucose, Bld: 119 mg/dL — ABNORMAL HIGH (ref 70–99)
Potassium: 4.3 mmol/L (ref 3.5–5.1)
Sodium: 140 mmol/L (ref 135–145)

## 2020-03-21 LAB — MAGNESIUM: Magnesium: 2 mg/dL (ref 1.7–2.4)

## 2020-03-21 IMAGING — CT CT HEAD W/O CM
4 series · 17 of 47 positions shown, 19 images · non-contrast
Comparison: Head CT [DATE]

CLINICAL DATA: Extremity weakness.  Code stroke follow-up.

EXAM:
CT HEAD WITHOUT CONTRAST
TECHNIQUE: Contiguous axial images were obtained from the base of the skull
through the vertex without intravenous contrast.

[Series 3: head without · axial · non-contrast · 0.39mm/px · z∈[-126,-6]mm · 7 of 32 slices shown, 9 images]
[im 4/32  brain]
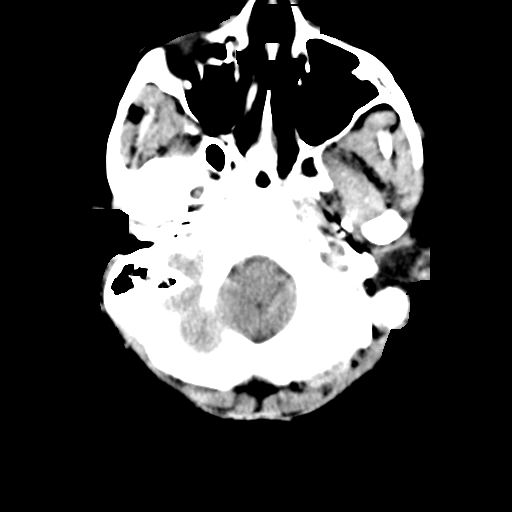
[im 4/32  bone]
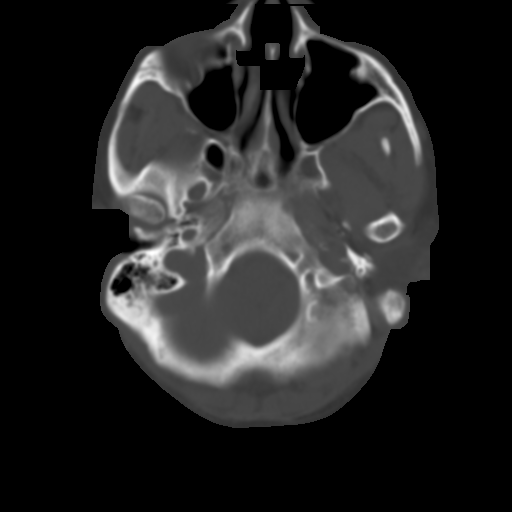
[im 8/32  brain]
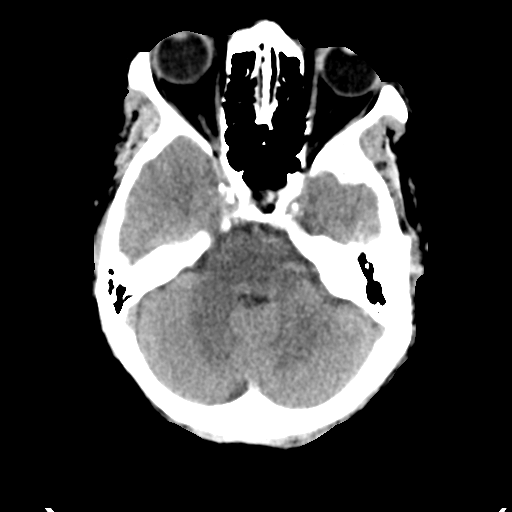
[im 12/32  brain]
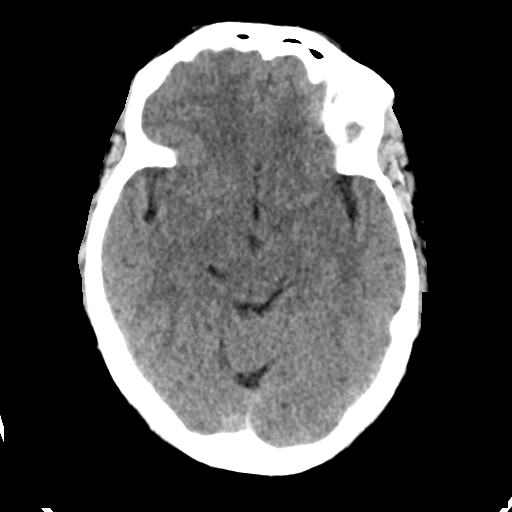
[im 16/32  brain]
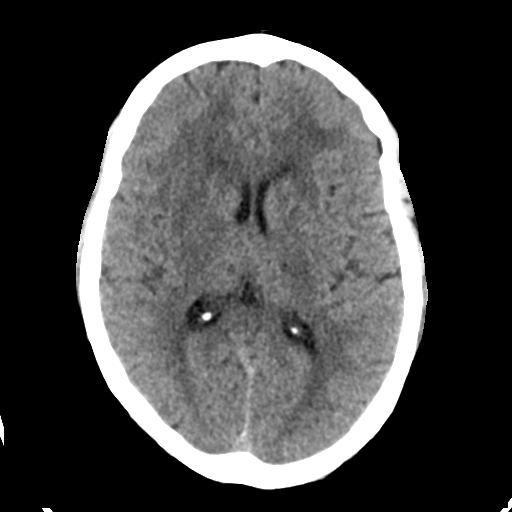
[im 20/32  brain]
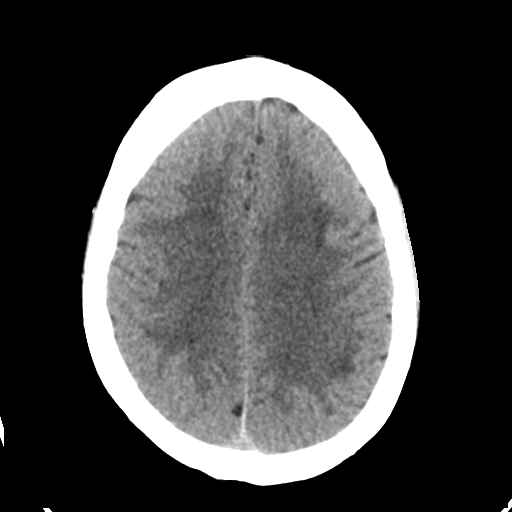
[im 20/32  bone]
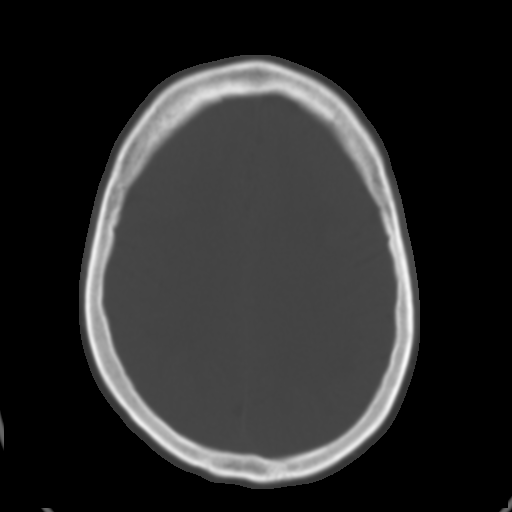
[im 24/32  brain]
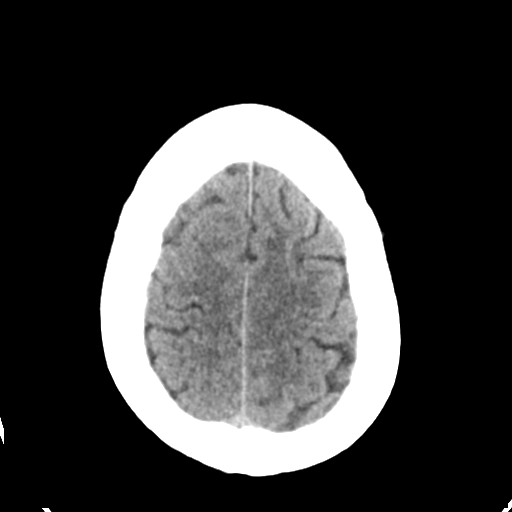
[im 28/32  brain]
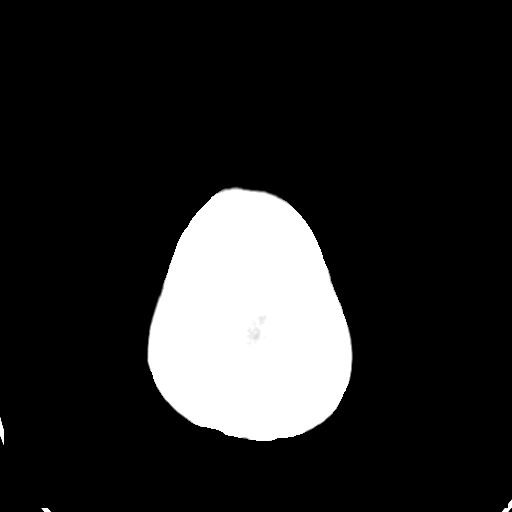

[Series 4: head bone · axial · 0.39mm/px · z∈[-127,-71]mm · 4 of 79 slices shown]
[im 8/79  bone]
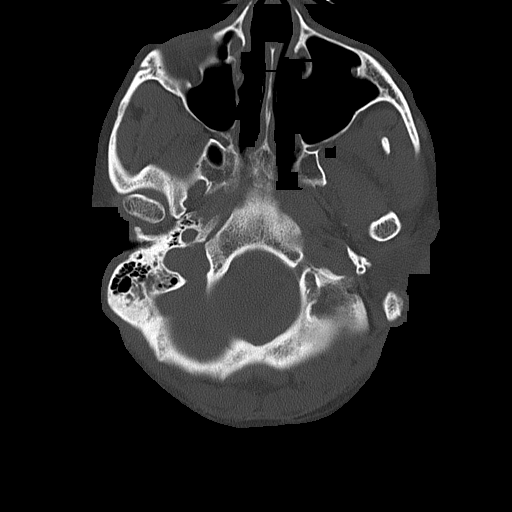
[im 16/79  bone]
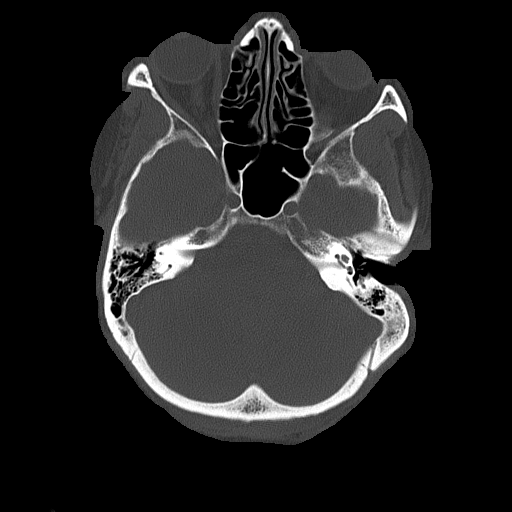
[im 24/79  bone]
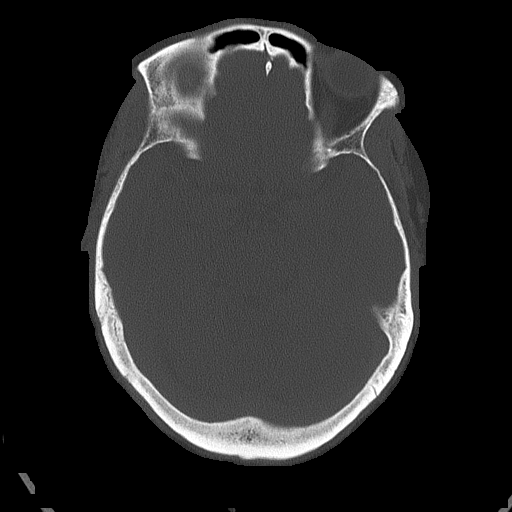
[im 36/79  bone]
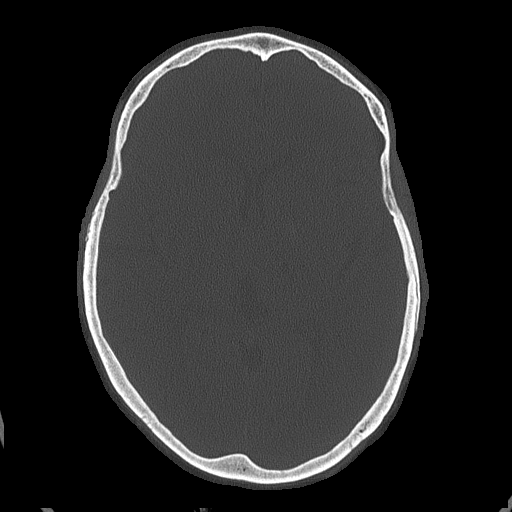

[Series 5: head without cor · coronal · non-contrast · 0.31mm/px · 3 of 67 slices shown]
[im 23/67  brain]
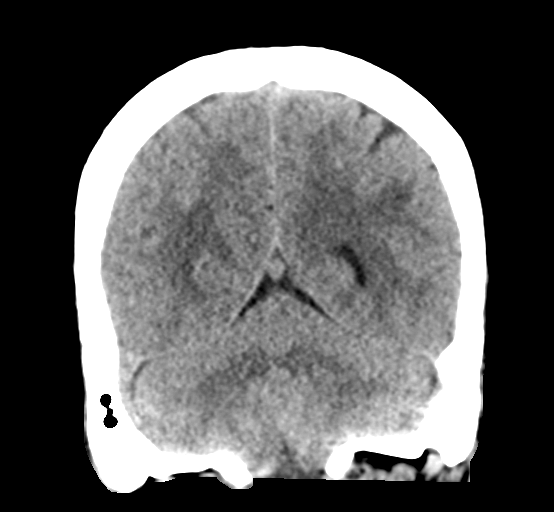
[im 30/67  brain]
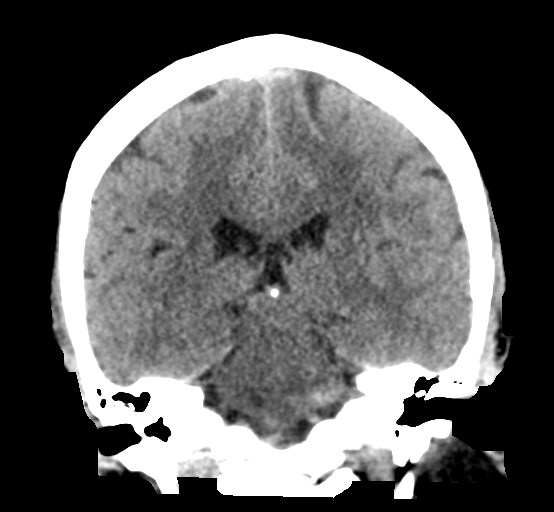
[im 37/67  brain]
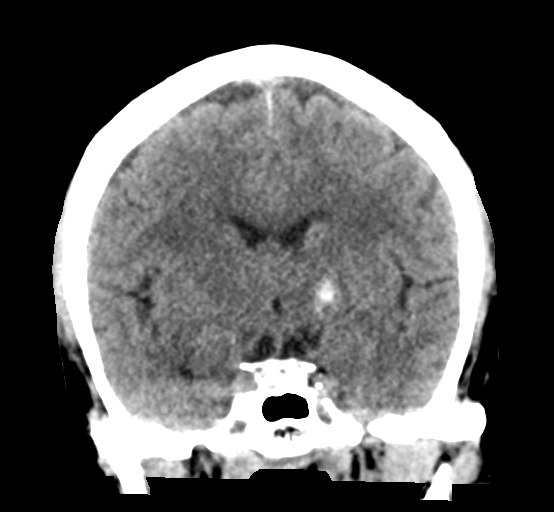

[Series 6: head without sag · sagittal · non-contrast · 0.31mm/px · 3 of 55 slices shown]
[im 19/55  brain]
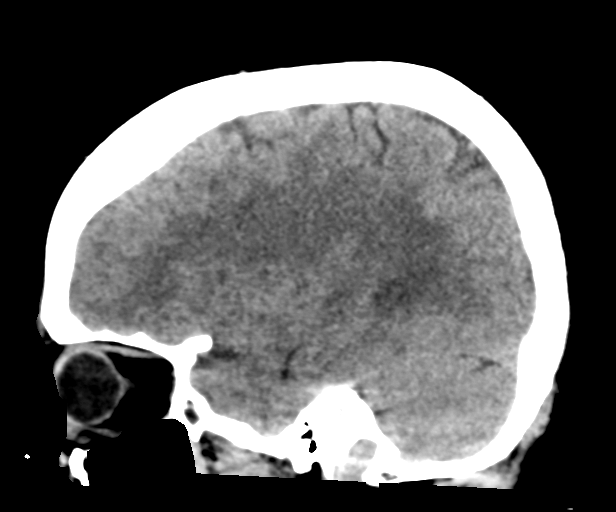
[im 28/55  brain]
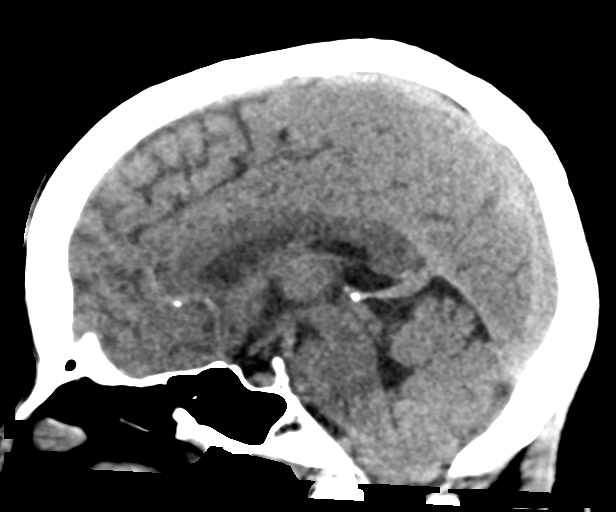
[im 37/55  brain]
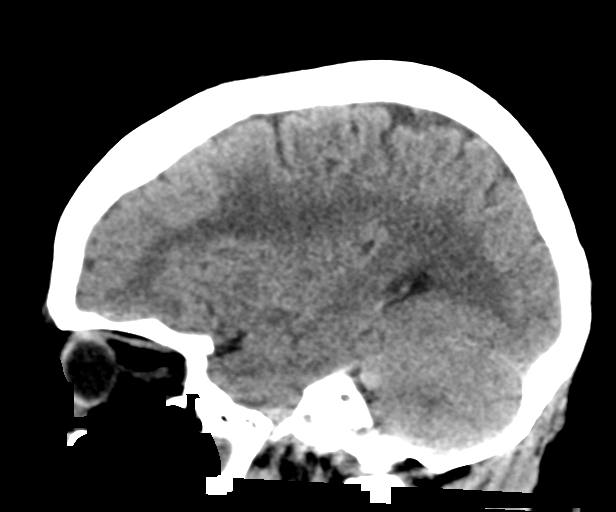

[17 of 47 positions shown; findings below may reference images not displayed]

FINDINGS: Brain: There is no mass, hemorrhage or extra-axial collection. The
size and configuration of the ventricles and extra-axial CSF spaces
are normal. There is hypoattenuation of the white matter, most
commonly indicating chronic small vessel disease. Dense basal
ganglia calcification is unchanged.

Vascular: Atherosclerotic calcification of the internal carotid
arteries at the skull base. No abnormal hyperdensity of the major
intracranial arteries or dural venous sinuses.

Skull: The visualized skull base, calvarium and extracranial soft
tissues are normal.

Sinuses/Orbits: No fluid levels or advanced mucosal thickening of
the visualized paranasal sinuses. No mastoid or middle ear effusion.
The orbits are normal.
IMPRESSION: Chronic small vessel disease without acute intracranial abnormality.

## 2020-03-21 MED ORDER — ASPIRIN EC 81 MG PO TBEC
81.0000 mg | DELAYED_RELEASE_TABLET | Freq: Every day | ORAL | Status: DC
  Administered 2020-03-21 – 2020-03-28 (×8): 81 mg via ORAL
  Filled 2020-03-21 (×8): qty 1

## 2020-03-21 MED ORDER — ENOXAPARIN SODIUM 40 MG/0.4ML ~~LOC~~ SOLN
40.0000 mg | Freq: Every day | SUBCUTANEOUS | Status: DC
  Administered 2020-03-21 – 2020-03-28 (×8): 40 mg via SUBCUTANEOUS
  Filled 2020-03-21 (×8): qty 0.4

## 2020-03-21 MED ORDER — CLOPIDOGREL BISULFATE 75 MG PO TABS
75.0000 mg | ORAL_TABLET | Freq: Every day | ORAL | Status: DC
  Administered 2020-03-21 – 2020-03-28 (×8): 75 mg via ORAL
  Filled 2020-03-21 (×8): qty 1

## 2020-03-21 NOTE — Evaluation (Signed)
Speech Language Pathology Evaluation Patient Details Name: Myrikal Messmer MRN: [id number] DOB: 27-Mar-1955 Today's Date: 03/21/2020 Time: 1435-1500 SLP Time Calculation (min) (ACUTE ONLY): 25 min  Problem List:  Patient Active Problem List   Diagnosis Date Noted  . 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 Fever 11/27/2012  . Dehydration 11/27/2012  . UTI (lower urinary tract infection) 11/27/2012   Past Medical History:  Past Medical History:  Diagnosis Date  . Cancer ([place])   . Diabetes mellitus ([place])    A1C 6.5% on 03/20/20  . GERD (gastroesophageal reflux disease)   . Hypertension   . Stroke [facility])    Past Surgical History:  Past Surgical History:  Procedure Laterality Date  . BREAST SURGERY     HPI:  65 y.o. 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 MRI of brain "Abnormal signal in the ventral cervical spinal cord centered at the lower C4 level. Severe intracranial small vessel disease appears stable since November, including numerous chronic microhemorrhages in the  thalami. No acute intracranial abnormality"   Assessment / Plan / Recommendation Clinical Impression  Pt's cognitive function generally appears to be functional, needing cues for encouragement throughout testing. She scored within the range of mild impairment on the SLUMS (25/30), but she was very close to her targets in the sections in which she lost points and often could tell me what the correct information was, even if she didn't indicate it on the test. She did a lot of self-monitoring and correcting throughout testing as well. She believes that she is at her baseline, and imaging would also support that this is likely to be true. SLP to sign off acutely.    SLP Assessment  SLP Recommendation/Assessment: Patient does not need any further Speech Lanaguage Pathology Services SLP Visit Diagnosis:  Cognitive communication deficit (R41.841)    Follow Up Recommendations  None    Frequency and Duration           SLP Evaluation Cognition  Overall Cognitive Status: Within Functional Limits for tasks assessed       Comprehension  Auditory Comprehension Overall Auditory Comprehension: Appears within functional limits for tasks assessed    Expression Expression Primary Mode of Expression: Verbal Verbal Expression Overall Verbal Expression: Appears within functional limits for tasks assessed Written Expression Dominant Hand: Right   Oral / Motor  Motor Speech Overall Motor Speech: Appears within functional limits for tasks assessed   GO                    Osie Bond., M.A. [place] Acute Rehabilitation Services Pager [phone] Office [phone]  03/21/2020, 3:50 PM

## 2020-03-21 NOTE — Progress Notes (Signed)
Occupational Therapy Treatment Patient Details Name: Deborah [name] MRN: [id number] DOB: 1955-08-31 Today's Date: 03/21/2020    History of present illness 46yoF with a past medical history significant for breast cancer (locally metastatic to lymph nodes), hypertension, and prior strokes.  She reports she was in her usual state of health when she began to have left-sided neck pain while sitting and watching TV.  This was then associated with dizziness, pain all over her body and generalized weakness.  L hemiparesis worse than R hemiparesis.   OT comments  Patient seen for second visit.  Patient switched to a more appropriate recliner this date.  Demonstrated improved sit to stand and stand pivot to smaller recliner.  Patient able to move with Mod A of one and a second individual for safety with lines only.  Patient demo'd improved stand balance and tolerance over earlier session.  Patient continues with heavy reliance on upper extremities to compensate for lower body weakness.  Patient is being considered for CIR, and should do well as she is very motivated.    Follow Up Recommendations  CIR    Equipment Recommendations  Tub/shower seat;3 in 1 bedside commode    Recommendations for Other Services      Precautions / Restrictions Precautions Precautions: Fall Precaution Comments: Monitor BP Restrictions Weight Bearing Restrictions: No       Mobility Bed Mobility               General bed mobility comments: in chair on arrival    Transfers Overall transfer level: Needs assistance Equipment used: Rolling walker (2 wheeled) Transfers: Sit to/from [employer] Transfers Sit to Stand: Min assist;+2 physical assistance Stand pivot transfers: Min assist;+2 physical assistance       General transfer comment: Pt needed mod assist of 2 to stand. Pt needing assist to power up and cues for hand placement. Once up, pt was able to march in place.    Balance Overall balance  assessment: Needs assistance Sitting-balance support: No upper extremity supported;Feet supported Sitting balance-Leahy Scale: Fair     Standing balance support: Bilateral upper extremity supported;During functional activity Standing balance-Leahy Scale: Poor Standing balance comment: relies heavily on Bil UE suport and external support.                            Praxis      Cognition Arousal/Alertness: Awake/alert Behavior During Therapy: WFL for tasks assessed/performed Overall Cognitive Status: Within Functional Limits for tasks assessed                                          Exercises Exercises: General Lower Extremity General Exercises - Lower Extremity Ankle Circles/Pumps: AROM;Both;5 reps;Supine Long Arc Quad: AROM;Both;10 reps;Seated Hip Flexion/Marching: AROM;Both;10 reps;Seated   Shoulder Instructions       General Comments Pt wanted to walk therefore stayed safe and had her walk 3 feet forward and back multiple x.    Pertinent Vitals/ Pain       Pain Assessment: No/denies pain  Home Living Family/patient expects to be discharged to:: Inpatient rehab Living Arrangements: Children Available Help at Discharge: Available PRN/intermittently Type of Home: House                       Home Equipment: None  Prior Functioning/Environment Level of Independence: Independent        Comments: Cares for 5 grandchildren ranging in ages from 76 to 79.  Patient works full time and needed no assist for ADL or mobility.   Frequency  Min 2X/week        Progress Toward Goals  OT Goals(current goals can now be found in the care plan section)     Acute Rehab OT Goals Patient Stated Goal: I want to move and get back to work  Plan Discharge plan remains appropriate    Co-evaluation                 AM-PAC OT "6 Clicks" Daily Activity     Outcome Measure                    End of Session Equipment  Utilized During Treatment: Gait belt  OT Visit Diagnosis: Unsteadiness on feet (R26.81);Muscle weakness (generalized) (M62.81);Hemiplegia and hemiparesis Hemiplegia - Right/Left: Left Hemiplegia - dominant/non-dominant: Non-Dominant Hemiplegia - caused by: Unspecified   Activity Tolerance Patient tolerated treatment well   Patient Left in chair;with call bell/phone within reach   Nurse Communication          Time: 1150-1200 OT Time Calculation (min): 10 min  Charges: OT Treatments $Therapeutic Activity: 8-22 mins  03/21/2020  [place], OTR/L  Acute Rehabilitation Services  Office:  [phone]    Metta Clines 03/21/2020, 3:29 PM

## 2020-03-21 NOTE — Evaluation (Signed)
Physical Therapy Evaluation Patient Details Name: Deborah [name] MRN: [id number] DOB: 04/15/1955 Today's Date: 03/21/2020   History of Present Illness  89yoF with a past medical history significant for breast cancer (locally metastatic to lymph nodes), hypertension, and prior strokes.  She reports she was in her usual state of health when she began to have left-sided neck pain while sitting and watching TV.  This was then associated with dizziness, pain all over her body and generalized weakness.  L hemiparesis worse than R hemiparesis.  Clinical Impression  Pt admitted with above diagnosis. Pt was able to take a few steps with incr assist of 2 persons with poor postural stability limiting safe mobility.  Pt with weakness on bil UE and LEs but left hemibody more weak than right. Recommend CIR consult as pt was independent PTA caring for grandchildren and working full time.  Will follow acutely. Pt currently with functional limitations due to balance and endurance deficits. Pt will benefit from skilled PT to increase their independence and safety with mobility to allow discharge to the venue listed below.      Follow Up Recommendations CIR    Equipment Recommendations  Rolling walker with 5" wheels;3in1 (PT)    Recommendations for Other Services       Precautions / Restrictions Precautions Precautions: Fall Precaution Comments: Monitor BP Restrictions Weight Bearing Restrictions: No      Mobility  Bed Mobility               General bed mobility comments: in chair on arrival    Transfers Overall transfer level: Needs assistance Equipment used: Rolling walker (2 wheeled) Transfers: Sit to/from [employer] Transfers Sit to Stand: Mod assist;+2 physical assistance Stand pivot transfers: Min assist;+2 physical assistance       General transfer comment: Pt needed mod assist of 2 to stand. Pt needing assist to power up and cues for hand placement. Once up, pt was able to  march in place.  Ambulation/Gait Ambulation/Gait assistance: Min assist;+2 physical assistance;Mod assist Gait Distance (Feet): 27 Feet (3 feet forward and back 3 x.) Assistive device: Rolling walker (2 wheeled) Gait Pattern/deviations: Decreased stride length;Decreased step length - right;Decreased step length - left;Shuffle;Leaning posteriorly;Drifts right/left;Trunk flexed   Gait velocity interpretation: <1.31 ft/sec, indicative of household ambulator General Gait Details: Pt maintains slight trunk and neck flexed needing cues for upright stance but cannot maintain.  Pt needs assist to move RW as well as for sequencing steps and RW.  Pt heavily reliant on UEs as well. Pt with poor postural stability overall.  Stairs            Wheelchair Mobility    Modified Rankin (Stroke Patients Only)       Balance Overall balance assessment: Needs assistance Sitting-balance support: No upper extremity supported;Feet supported Sitting balance-Leahy Scale: Fair     Standing balance support: Bilateral upper extremity supported;During functional activity Standing balance-Leahy Scale: Poor Standing balance comment: relies heavily on Bil UE suport and external support.                             Pertinent Vitals/Pain Pain Assessment: No/denies pain    Home Living Family/patient expects to be discharged to:: Inpatient rehab Living Arrangements: Children Available Help at Discharge: Available PRN/intermittently Type of Home: House         Home Equipment: None      Prior Function Level of Independence: Independent  Comments: Cares for 5 grandchildren ranging in ages from 12 to 62.  Patient works full time and needed no assist for ADL or mobility.     Hand Dominance   Dominant Hand: Right    Extremity/Trunk Assessment   Upper Extremity Assessment Upper Extremity Assessment: Defer to OT evaluation    Lower Extremity Assessment Lower Extremity  Assessment: RLE deficits/detail;LLE deficits/detail RLE Deficits / Details: 3-/5 grossly LLE Deficits / Details: 2+/5 grossly    Cervical / Trunk Assessment Cervical / Trunk Assessment: Kyphotic  Communication   Communication: No difficulties  Cognition Arousal/Alertness: Awake/alert Behavior During Therapy: WFL for tasks assessed/performed Overall Cognitive Status: Within Functional Limits for tasks assessed                                        General Comments General comments (skin integrity, edema, etc.): Pt wanted to walk therefore stayed safe and had her walk 3 feet forward and back multiple x.    Exercises General Exercises - Lower Extremity Ankle Circles/Pumps: AROM;Both;5 reps;Supine Long Arc Quad: AROM;Both;10 reps;Seated Hip Flexion/Marching: AROM;Both;10 reps;Seated   Assessment/Plan    PT Assessment Patient needs continued PT services  PT Problem List Decreased activity tolerance;Decreased balance;Decreased mobility;Decreased knowledge of use of DME;Decreased safety awareness;Decreased knowledge of precautions;Pain;Decreased strength       PT Treatment Interventions DME instruction;Gait training;Functional mobility training;Therapeutic activities;Therapeutic exercise;Balance training;Patient/family education;Neuromuscular re-education    PT Goals (Current goals can be found in the Care Plan section)  Acute Rehab PT Goals Patient Stated Goal: I want to move and get back to work PT Goal Formulation: With patient Time For Goal Achievement: 04/04/20 Potential to Achieve Goals: Good    Frequency Min 3X/week   Barriers to discharge Decreased caregiver support      Co-evaluation               AM-PAC PT "6 Clicks" Mobility  Outcome Measure Help needed turning from your back to your side while in a flat bed without using bedrails?: A Lot Help needed moving from lying on your back to sitting on the side of a flat bed without using  bedrails?: A Lot Help needed moving to and from a bed to a chair (including a wheelchair)?: A Lot Help needed standing up from a chair using your arms (e.g., wheelchair or bedside chair)?: A Lot Help needed to walk in hospital room?: A Lot Help needed climbing 3-5 steps with a railing? : Total 6 Click Score: 11    End of Session Equipment Utilized During Treatment: Gait belt Activity Tolerance: Patient limited by fatigue Patient left: in chair;with call bell/phone within reach;with chair alarm set Nurse Communication: Mobility status PT Visit Diagnosis: Unsteadiness on feet (R26.81);Muscle weakness (generalized) (M62.81)    Time: 9675-9163 PT Time Calculation (min) (ACUTE ONLY): 21 min   Charges:   PT Evaluation $PT Eval Moderate Complexity: 1 Mod          Dawn W,PT Acute Rehabilitation Services Pager:  [phone]  Office:  [place] 03/21/2020, 1:57 PM

## 2020-03-21 NOTE — Progress Notes (Signed)
Rehab Admissions Coordinator Note:  Patient was screened by Cleatrice Burke for appropriateness for an Inpatient Acute Rehab Consult per therapy recommendations.  At this time, we are recommending Inpatient Rehab consult. I will place order per protocol.  Cleatrice Burke RN MSN 03/21/2020, 2:05 PM  I can be reached at [phone].

## 2020-03-21 NOTE — Consult Note (Signed)
Physical Medicine and Rehabilitation Consult  Reason for Consult: Functional deficits.  Referring Physician: Dr.Xu  HPI: Deborah [name] is a 65 y.o. female with history of T2DM, HTN, breast cancer with nodal mets, prior strokes who was admitted on 03/20/2020 with onset of severe bilateral shoulder pain, radiating to her neck pain with dizziness and generalized weakness left >right with inability move. UDS negative.  CTA head/heck showed occlusion of L-VA at origin with reconstitution at distal L-V4 due to collateral flow across VB confluence and 50% stenosis proximal right ICA.  She received tPA and follow up head CT negative for bleed.  MRI C spine showed abnormal signal lower C4 ventral aspect without compression, question ischemia and severe small vessel disease with numerous chronic microhemorrhages.  MRI brain without acute infarct.  Echocardiogram with ejection fraction of 60-65%, mild LVH and mild thickening of AV.  Dr. Erlinda Hong recommends DAPT for L-VA occlusion acute v/s chronic and work up  underway. Patient with resultant weakness B/l UE/ B/l LE left>right with dizziness and pain all over. Therapy evaluations completed revealing diffuse weakness with flexed truck, poor postural stability and generalized weakness.  CIR recommended due to functional decline.   Review of Systems  Constitutional: Negative for chills and fever.  Eyes: Negative for blurred vision and double vision.  Respiratory: Negative for cough and shortness of breath.   Cardiovascular: Negative for chest pain and palpitations.  Musculoskeletal: Positive for back pain (from neck to hips). Negative for myalgias.  Neurological: Positive for dizziness (at times), sensory change, weakness and headaches.  All other systems reviewed and are negative.  Past Medical History:  Diagnosis Date  . Cancer ([place])   . Diabetes mellitus ([place])    A1C 6.5% on 03/20/20  . GERD (gastroesophageal reflux disease)   . Hypertension   .  Stroke [facility])     Past Surgical History:  Procedure Laterality Date  . BREAST SURGERY      Family History  Problem Relation Age of Onset  . Breast cancer Mother   . Pancreatic cancer Mother   . Heart disease Mother   . Heart disease Father   . Breast cancer Sister      Social History: Divorced--has adopted her grandchildren and has custody of three. She continues to work part time at [employer]--does multiple things there and planning on decreasing to part time. She  reports that she has never smoked. She has never used smokeless tobacco. She has a mixed drink once month.  She reports that she does not does not use drugs.    Allergies  Allergen Reactions  . Levofloxacin Hives  . Lisinopril Swelling    Swelling of face Had facial swelling 10/06/13 which improved with OTC Benadryl.  Recurred 10/10/13, took OTC Benadryl, and was further evaluated at PCP office 10/11/13 for continued swelling of lips and face. She was treated with Kenalog in office and Benadryl and Medrol dosepak as outpatient.   . Aleve [Naproxen]   . Capsaicin-Menthol Hives  . Ibuprofen Hives  . Shellfish Allergy Hives    Medications Prior to Admission  Medication Sig Dispense Refill  . chlorthalidone (HYGROTON) 25 MG tablet Take 25 mg by mouth daily.    Marland Kitchen ezetimibe (ZETIA) 10 MG tablet Take 10 mg by mouth daily.    . ferrous sulfate 325 (65 FE) MG tablet Take 325 mg by mouth every other day.    . metoprolol succinate (TOPROL-XL) 25 MG 24 hr tablet Take 25 mg by  mouth daily.    Marland Kitchen azithromycin (ZITHROMAX) 250 MG tablet Take 1 pill daily until gone (Patient not taking: No sig reported) 4 each 0  . HYDROcodone-homatropine (HYCODAN) 5-1.5 MG/5ML syrup Take 5 mLs by mouth every 6 (six) hours as needed for cough. (Patient not taking: No sig reported) 120 mL 0  . LORazepam (ATIVAN) 1 MG tablet Take 1 mg by mouth every 8 (eight) hours. (Patient not taking: Reported on 03/20/2020)    . metFORMIN (GLUCOPHAGE) 500 MG  tablet Take 500 mg by mouth daily with breakfast.    . ondansetron (ZOFRAN) 4 MG tablet Take 1 tablet (4 mg total) by mouth every 6 (six) hours. (Patient not taking: No sig reported) 12 tablet 0  . pantoprazole (PROTONIX) 40 MG tablet Take 40 mg by mouth daily.      Home: Home Living Family/patient expects to be discharged to:: Inpatient rehab Living Arrangements: Children Available Help at Discharge: Available PRN/intermittently Type of Home: House Home Equipment: None  Functional History: Prior Function Level of Independence: Independent Comments: Cares for 5 grandchildren ranging in ages from 88 to 42.  Patient works full time and needed no assist for ADL or mobility. Functional Status:  Mobility: Bed Mobility Overal bed mobility: Needs Assistance Bed Mobility: Rolling,Supine to Sit Rolling: Mod assist Supine to sit: Mod assist,HOB elevated General bed mobility comments: in chair on arrival Transfers Overall transfer level: Needs assistance Equipment used: Rolling walker (2 wheeled) Transfers: Sit to/from [employer] Sit to Stand: Mod assist,+2 physical assistance Stand pivot transfers: Min assist,+2 physical assistance Squat pivot transfers: Mod assist,Max assist General transfer comment: Pt needed mod assist of 2 to stand. Pt needing assist to power up and cues for hand placement. Once up, pt was able to march in place. Ambulation/Gait Ambulation/Gait assistance: Min assist,+2 physical assistance,Mod assist Gait Distance (Feet): 27 Feet (3 feet forward and back 3 x.) Assistive device: Rolling walker (2 wheeled) Gait Pattern/deviations: Decreased stride length,Decreased step length - right,Decreased step length - left,Shuffle,Leaning posteriorly,Drifts right/left,Trunk flexed General Gait Details: Pt maintains slight trunk and neck flexed needing cues for upright stance but cannot maintain.  Pt needs assist to move RW as well as for sequencing steps and RW.  Pt  heavily reliant on UEs as well. Pt with poor postural stability overall. Gait velocity interpretation: <1.31 ft/sec, indicative of household ambulator    ADL: ADL Overall ADL's : Needs assistance/impaired Eating/Feeding: Minimal assistance,Sitting Grooming: Wash/dry hands,Wash/dry face,Moderate assistance,Sitting Upper Body Bathing: Moderate assistance,Sitting Lower Body Bathing: Maximal assistance,Sitting/lateral leans Upper Body Dressing : Moderate assistance,Sitting Lower Body Dressing: Maximal assistance,Sitting/lateral leans Functional mobility during ADLs: Moderate assistance  Cognition: Cognition Overall Cognitive Status: Within Functional Limits for tasks assessed Orientation Level: Oriented to person,Oriented to place,Oriented to time,Oriented to situation Cognition Arousal/Alertness: Awake/alert Behavior During Therapy: WFL for tasks assessed/performed Overall Cognitive Status: Within Functional Limits for tasks assessed   Blood pressure 134/82, pulse 76, temperature 97.9 F (36.6 C), temperature source Oral, resp. rate (!) 22, height 5\' 1"  (1.549 m), weight 88.9 kg, SpO2 100 %. Physical Exam Vitals and nursing note reviewed.  Constitutional:      Appearance: She is obese.     Comments: Sitting up in chair ordering supper. Polite and appropriate.   HENT:     Head: Normocephalic and atraumatic.     Right Ear: External ear normal.     Left Ear: External ear normal.     Nose: Nose normal.  Eyes:     General:  Right eye: No discharge.        Left eye: No discharge.     Extraocular Movements: Extraocular movements intact.  Cardiovascular:     Rate and Rhythm: Regular rhythm. Bradycardia present.  Pulmonary:     Effort: Pulmonary effort is normal. No respiratory distress.     Breath sounds: No stridor.  Abdominal:     General: Abdomen is flat. Bowel sounds are normal. There is no distension.  Musculoskeletal:     Cervical back: Normal range of motion and  neck supple.     Comments: No edema or tenderness in extremities  Skin:    General: Skin is warm and dry.  Neurological:     Mental Status: She is alert.     Comments: Speech soft and clear.  Alert Able to follow basic commands without difficulty.  Motor: RUE: Shoulder abduction 3 -/5, distally 4/5 Left upper extremity: Shoulder abduction 3 -/5, distally 4 -/5 Right lower extremity: 4+/5 proximal distally Left lower extremity: 4-4+/5 proximal to distal   Psychiatric:        Mood and Affect: Mood normal.        Behavior: Behavior normal.     Results for orders placed or performed during the hospital encounter of 03/20/20 (from the past 24 hour(s))  Basic metabolic panel     Status: Abnormal   Collection Time: 03/21/20  5:35 AM  Result Value Ref Range   Sodium 140 135 - 145 mmol/L   Potassium 4.3 3.5 - 5.1 mmol/L   Chloride 107 98 - 111 mmol/L   CO2 25 22 - 32 mmol/L   Glucose, Bld 119 (H) 70 - 99 mg/dL   BUN 7 (L) 8 - 23 mg/dL   Creatinine, Ser 0.96 0.44 - 1.00 mg/dL   Calcium 8.8 (L) 8.9 - 10.3 mg/dL   GFR, Estimated >60 >60 mL/min   Anion gap 8 5 - 15  CBC     Status: Abnormal   Collection Time: 03/21/20  5:35 AM  Result Value Ref Range   WBC 7.3 4.0 - 10.5 K/uL   RBC 4.38 3.87 - 5.11 MIL/uL   Hemoglobin 12.1 12.0 - 15.0 g/dL   HCT 38.2 36.0 - 46.0 %   MCV 87.2 80.0 - 100.0 fL   MCH 27.6 26.0 - 34.0 pg   MCHC 31.7 30.0 - 36.0 g/dL   RDW 16.7 (H) 11.5 - 15.5 %   Platelets 224 150 - 400 K/uL   nRBC 0.0 0.0 - 0.2 %  Magnesium     Status: None   Collection Time: 03/21/20  5:35 AM  Result Value Ref Range   Magnesium 2.0 1.7 - 2.4 mg/dL   CT Code Stroke CTA Head W/WO contrast  Result Date: 03/20/2020 CLINICAL DATA:  Generalized weakness EXAM: CT ANGIOGRAPHY HEAD AND NECK TECHNIQUE: Multidetector CT imaging of the head and neck was performed using the standard protocol during bolus administration of intravenous contrast. Multiplanar CT image reconstructions and MIPs  were obtained to evaluate the vascular anatomy. Carotid stenosis measurements (when applicable) are obtained utilizing NASCET criteria, using the distal internal carotid diameter as the denominator. CONTRAST:  67mL OMNIPAQUE IOHEXOL 350 MG/ML SOLN COMPARISON:  None. FINDINGS: CTA NECK FINDINGS SKELETON: There is no bony spinal canal stenosis. No lytic or blastic lesion. OTHER NECK: Normal pharynx, larynx and major salivary glands. No cervical lymphadenopathy. Unremarkable thyroid gland. UPPER CHEST: Bandlike atelectasis in the left upper lobe. AORTIC ARCH: There is no calcific atherosclerosis of the aortic  arch. There is no aneurysm, dissection or hemodynamically significant stenosis of the visualized portion of the aorta. Conventional 3 vessel aortic branching pattern. The visualized proximal subclavian arteries are widely patent. RIGHT CAROTID SYSTEM: No dissection, occlusion or aneurysm. There is low density atherosclerosis extending into the proximal ICA, resulting in 50% stenosis. LEFT CAROTID SYSTEM: No dissection, occlusion or aneurysm. Mild atherosclerotic calcification at the carotid bifurcation without hemodynamically significant stenosis. VERTEBRAL ARTERIES: Codominant configuration. Left vertebral artery is occluded at its origin. Right vertebral artery is normal to the vertebrobasilar confluence. CTA HEAD FINDINGS POSTERIOR CIRCULATION: --Vertebral arteries: There is opacification of the distal left V4 segment due to collateral flow across the vertebrobasilar confluence. --Inferior cerebellar arteries: Right PICA is patent but the left PICA is occluded. --Basilar artery: Normal. --Superior cerebellar arteries: Normal. --Posterior cerebral arteries (PCA): Normal. ANTERIOR CIRCULATION: --Intracranial internal carotid arteries: Normal. --Anterior cerebral arteries (ACA): Normal. Both A1 segments are present. Patent anterior communicating artery (a-comm). --Middle cerebral arteries (MCA): Normal. VENOUS  SINUSES: As permitted by contrast timing, patent. ANATOMIC VARIANTS: None Review of the MIP images confirms the above findings. IMPRESSION: 1. No intracranial arterial occlusion or high-grade stenosis. 2. Occlusion of the left vertebral artery at its origin with reconstitution of the distal left V4 segment due to collateral flow across the vertebrobasilar confluence. 3. Approximately 50% stenosis of the proximal right internal carotid artery secondary to low density atherosclerosis. Critical Value/emergent results were called by telephone at the time of interpretation on 03/20/2020 at 1:30 am to provider [facility] , who verbally acknowledged these results. Electronically Signed   By: Ulyses Jarred M.D.   On: 03/20/2020 01:41   CT HEAD WO CONTRAST  Result Date: 03/21/2020 CLINICAL DATA:  Extremity weakness.  Code stroke follow-up. EXAM: CT HEAD WITHOUT CONTRAST TECHNIQUE: Contiguous axial images were obtained from the base of the skull through the vertex without intravenous contrast. COMPARISON:  Head CT 03/20/2020 FINDINGS: Brain: There is no mass, hemorrhage or extra-axial collection. The size and configuration of the ventricles and extra-axial CSF spaces are normal. There is hypoattenuation of the white matter, most commonly indicating chronic small vessel disease. Dense basal ganglia calcification is unchanged. Vascular: Atherosclerotic calcification of the internal carotid arteries at the skull base. No abnormal hyperdensity of the major intracranial arteries or dural venous sinuses. Skull: The visualized skull base, calvarium and extracranial soft tissues are normal. Sinuses/Orbits: No fluid levels or advanced mucosal thickening of the visualized paranasal sinuses. No mastoid or middle ear effusion. The orbits are normal. IMPRESSION: Chronic small vessel disease without acute intracranial abnormality. Electronically Signed   By: Ulyses Jarred M.D.   On: 03/21/2020 02:32   CT Code Stroke CTA Neck W/WO  contrast  Result Date: 03/20/2020 CLINICAL DATA:  Generalized weakness EXAM: CT ANGIOGRAPHY HEAD AND NECK TECHNIQUE: Multidetector CT imaging of the head and neck was performed using the standard protocol during bolus administration of intravenous contrast. Multiplanar CT image reconstructions and MIPs were obtained to evaluate the vascular anatomy. Carotid stenosis measurements (when applicable) are obtained utilizing NASCET criteria, using the distal internal carotid diameter as the denominator. CONTRAST:  65mL OMNIPAQUE IOHEXOL 350 MG/ML SOLN COMPARISON:  None. FINDINGS: CTA NECK FINDINGS SKELETON: There is no bony spinal canal stenosis. No lytic or blastic lesion. OTHER NECK: Normal pharynx, larynx and major salivary glands. No cervical lymphadenopathy. Unremarkable thyroid gland. UPPER CHEST: Bandlike atelectasis in the left upper lobe. AORTIC ARCH: There is no calcific atherosclerosis of the aortic arch. There is no aneurysm, dissection  or hemodynamically significant stenosis of the visualized portion of the aorta. Conventional 3 vessel aortic branching pattern. The visualized proximal subclavian arteries are widely patent. RIGHT CAROTID SYSTEM: No dissection, occlusion or aneurysm. There is low density atherosclerosis extending into the proximal ICA, resulting in 50% stenosis. LEFT CAROTID SYSTEM: No dissection, occlusion or aneurysm. Mild atherosclerotic calcification at the carotid bifurcation without hemodynamically significant stenosis. VERTEBRAL ARTERIES: Codominant configuration. Left vertebral artery is occluded at its origin. Right vertebral artery is normal to the vertebrobasilar confluence. CTA HEAD FINDINGS POSTERIOR CIRCULATION: --Vertebral arteries: There is opacification of the distal left V4 segment due to collateral flow across the vertebrobasilar confluence. --Inferior cerebellar arteries: Right PICA is patent but the left PICA is occluded. --Basilar artery: Normal. --Superior cerebellar  arteries: Normal. --Posterior cerebral arteries (PCA): Normal. ANTERIOR CIRCULATION: --Intracranial internal carotid arteries: Normal. --Anterior cerebral arteries (ACA): Normal. Both A1 segments are present. Patent anterior communicating artery (a-comm). --Middle cerebral arteries (MCA): Normal. VENOUS SINUSES: As permitted by contrast timing, patent. ANATOMIC VARIANTS: None Review of the MIP images confirms the above findings. IMPRESSION: 1. No intracranial arterial occlusion or high-grade stenosis. 2. Occlusion of the left vertebral artery at its origin with reconstitution of the distal left V4 segment due to collateral flow across the vertebrobasilar confluence. 3. Approximately 50% stenosis of the proximal right internal carotid artery secondary to low density atherosclerosis. Critical Value/emergent results were called by telephone at the time of interpretation on 03/20/2020 at 1:30 am to provider [facility] , who verbally acknowledged these results. Electronically Signed   By: Ulyses Jarred M.D.   On: 03/20/2020 01:41   MR BRAIN WO CONTRAST  Result Date: 03/20/2020 CLINICAL DATA:  65 year old female status post code stroke presentation this morning. Extremity weakness. EXAM: MRI HEAD WITHOUT CONTRAST MRI CERVICAL SPINE WITHOUT CONTRAST TECHNIQUE: Multiplanar, multiecho pulse sequences of the brain and surrounding structures, and cervical spine, to include the craniocervical junction and cervicothoracic junction, were obtained without intravenous contrast. COMPARISON:  Head CT, CTA head and neck earlier today. [facility] brain MRI 12/13/2019. CT cervical spine 12/24/2017. FINDINGS: MRI HEAD FINDINGS Brain: No restricted diffusion or evidence of acute infarction. Advanced bilateral cerebral white matter T2 and FLAIR hyperintensity which is Patchy and confluent in both hemispheres. Bilateral deep white matter capsule involvement. Superimposed chronic lacunar  infarcts in the thalami, larger on the left (series 10, image 14). T2 and FLAIR heterogeneity throughout the bilateral basal ganglia, and in the pons. Numerous chronic microhemorrhages in the deep gray nuclei, especially the thalami. Scattered hemispheric and left cerebellar chronic microhemorrhage elsewhere. Stable SWI since November. No definite cortical encephalomalacia. No midline shift, mass effect, evidence of mass lesion, ventriculomegaly, extra-axial collection or acute intracranial hemorrhage. Cervicomedullary junction and pituitary are within normal limits. Vascular: Major intracranial vascular flow voids are stable since November, preserved. Skull and upper cervical spine: Cervical spine detailed below. Visualized bone marrow signal is within normal limits. Sinuses/Orbits: Stable, and negative aside from chronic right orbital floor fracture with small volume herniated fat. Other: Trace mastoid fluid on the right is stable. Negative visible nasopharynx. MRI CERVICAL SPINE FINDINGS Alignment: Preserved cervical lordosis.  No spondylolisthesis. Vertebrae: No marrow edema or evidence of acute osseous abnormality. Visualized bone marrow signal is within normal limits. Cord: Abnormal patchy but fairly symmetric T2 and STIR hyperintense signal in the ventral cord centered at C4 (series 24, image 8). This has indistinct margins, but on axial images localizes to the ventral cord in a  fairly symmetric fashion (series 26, image 14). This seems somewhat eccentric to the central cord gray matter. No cord volume loss is associated. Above and below that level the visible spinal cord appears normal. Posterior Fossa, vertebral arteries, paraspinal tissues: Left vertebral artery flow void is lost in the neck in keeping with the CTA findings today. Cervicomedullary junction is within normal limits. Negative visible neck soft tissues and lung apices. Disc levels: C2-C3: Mild foraminal disc osteophyte complex. Mild facet  hypertrophy. No spinal stenosis. Mild to moderate right C3 foraminal stenosis. C3-C4: Foraminal disc osteophyte complex primarily on the right. No spinal stenosis. Moderate to severe right C4 foraminal stenosis. C4-C5: Minimal disc bulge and endplate spurring primarily at the foramen. No spinal stenosis. Mild to moderate right C5 foraminal stenosis. C5-C6: Minimal disc bulge and endplate spurring. Mild facet and ligament flavum hypertrophy. No spinal stenosis. Mild bilateral C6 foraminal stenosis. C6-C7: Minimal disc bulge and endplate spurring. Mild facet and ligament flavum hypertrophy. No significant stenosis. C7-T1:  Mild facet and ligament flavum hypertrophy.  No stenosis. Mild dorsal upper thoracic epidural lipomatosis. IMPRESSION: 1. Abnormal signal in the ventral cervical spinal cord centered at the lower C4 level. This does not appear related to any compressive myelopathy, and although the margins are indistinct a cord neoplasm is felt unlikely. Inflammatory or possibly ischemic (see #2) etiology are favored over other considerations. Follow-up post-contrast cervical spine imaging recommended to further characterize. 2. Severe intracranial small vessel disease appears stable since November, including numerous chronic microhemorrhages in the thalami. No acute intracranial abnormality. 3. Generally mild for age cervical spine degeneration, with no significant spinal stenosis. However, there is multilevel moderate and occasionally severe right side cervical neural foraminal stenosis. Electronically Signed   By: Genevie Ann M.D.   On: 03/20/2020 07:21   MR CERVICAL SPINE WO CONTRAST  Result Date: 03/20/2020 CLINICAL DATA:  65 year old female status post code stroke presentation this morning. Extremity weakness. EXAM: MRI HEAD WITHOUT CONTRAST MRI CERVICAL SPINE WITHOUT CONTRAST TECHNIQUE: Multiplanar, multiecho pulse sequences of the brain and surrounding structures, and cervical spine, to include the  craniocervical junction and cervicothoracic junction, were obtained without intravenous contrast. COMPARISON:  Head CT, CTA head and neck earlier today. [facility] brain MRI 12/13/2019. CT cervical spine 12/24/2017. FINDINGS: MRI HEAD FINDINGS Brain: No restricted diffusion or evidence of acute infarction. Advanced bilateral cerebral white matter T2 and FLAIR hyperintensity which is Patchy and confluent in both hemispheres. Bilateral deep white matter capsule involvement. Superimposed chronic lacunar infarcts in the thalami, larger on the left (series 10, image 14). T2 and FLAIR heterogeneity throughout the bilateral basal ganglia, and in the pons. Numerous chronic microhemorrhages in the deep gray nuclei, especially the thalami. Scattered hemispheric and left cerebellar chronic microhemorrhage elsewhere. Stable SWI since November. No definite cortical encephalomalacia. No midline shift, mass effect, evidence of mass lesion, ventriculomegaly, extra-axial collection or acute intracranial hemorrhage. Cervicomedullary junction and pituitary are within normal limits. Vascular: Major intracranial vascular flow voids are stable since November, preserved. Skull and upper cervical spine: Cervical spine detailed below. Visualized bone marrow signal is within normal limits. Sinuses/Orbits: Stable, and negative aside from chronic right orbital floor fracture with small volume herniated fat. Other: Trace mastoid fluid on the right is stable. Negative visible nasopharynx. MRI CERVICAL SPINE FINDINGS Alignment: Preserved cervical lordosis.  No spondylolisthesis. Vertebrae: No marrow edema or evidence of acute osseous abnormality. Visualized bone marrow signal is within normal limits. Cord: Abnormal patchy but fairly  symmetric T2 and STIR hyperintense signal in the ventral cord centered at C4 (series 24, image 8). This has indistinct margins, but on axial images localizes to the ventral  cord in a fairly symmetric fashion (series 26, image 14). This seems somewhat eccentric to the central cord gray matter. No cord volume loss is associated. Above and below that level the visible spinal cord appears normal. Posterior Fossa, vertebral arteries, paraspinal tissues: Left vertebral artery flow void is lost in the neck in keeping with the CTA findings today. Cervicomedullary junction is within normal limits. Negative visible neck soft tissues and lung apices. Disc levels: C2-C3: Mild foraminal disc osteophyte complex. Mild facet hypertrophy. No spinal stenosis. Mild to moderate right C3 foraminal stenosis. C3-C4: Foraminal disc osteophyte complex primarily on the right. No spinal stenosis. Moderate to severe right C4 foraminal stenosis. C4-C5: Minimal disc bulge and endplate spurring primarily at the foramen. No spinal stenosis. Mild to moderate right C5 foraminal stenosis. C5-C6: Minimal disc bulge and endplate spurring. Mild facet and ligament flavum hypertrophy. No spinal stenosis. Mild bilateral C6 foraminal stenosis. C6-C7: Minimal disc bulge and endplate spurring. Mild facet and ligament flavum hypertrophy. No significant stenosis. C7-T1:  Mild facet and ligament flavum hypertrophy.  No stenosis. Mild dorsal upper thoracic epidural lipomatosis. IMPRESSION: 1. Abnormal signal in the ventral cervical spinal cord centered at the lower C4 level. This does not appear related to any compressive myelopathy, and although the margins are indistinct a cord neoplasm is felt unlikely. Inflammatory or possibly ischemic (see #2) etiology are favored over other considerations. Follow-up post-contrast cervical spine imaging recommended to further characterize. 2. Severe intracranial small vessel disease appears stable since November, including numerous chronic microhemorrhages in the thalami. No acute intracranial abnormality. 3. Generally mild for age cervical spine degeneration, with no significant spinal  stenosis. However, there is multilevel moderate and occasionally severe right side cervical neural foraminal stenosis. Electronically Signed   By: Genevie Ann M.D.   On: 03/20/2020 07:21   MR CERVICAL SPINE W CONTRAST  Result Date: 03/20/2020 CLINICAL DATA:  Neck pain. Follow-up of prior MRI of the cervical spine EXAM: MRI CERVICAL SPINE WITH CONTRAST TECHNIQUE: Multiplanar, multisequence MR imaging of the cervical spine was performed following the administration of intravenous contrast. COMPARISON:  MRI of the cervical spine March 20, 2020 at 6:34 a.m. FINDINGS: Postcontrast images obtained to evaluate T2 hyperintense lesion within the anterior aspect of the cord at the C4 level. Faint contrast enhancement is noted associated with the area of increased T2 signal without evidence of nodularity or mass effect. Contrast enhancement within the visualized left vertebral artery likely represents enhancing thrombus. Normal flow void in the right vertebral artery. IMPRESSION: 1. Postcontrast images obtained to further evaluate abnormal signal within the anterior aspect of the spinal cord at the C4 level. There is faint contrast enhancement associated with the area of increased T2 signal without evidence of nodularity or mass effect. Findings are suggestive of cord ischemia. 2. Contrast enhancement within the visualized left vertebral artery likely represents enhancing thrombus. Electronically Signed   By: Pedro Earls M.D.   On: 03/20/2020 13:22   ECHOCARDIOGRAM COMPLETE  Result Date: 03/20/2020    ECHOCARDIOGRAM REPORT   Patient Name:   RYLANN MUNFORD Date of Exam: 03/20/2020 Medical Rec #:  [id number]      Height:       61.0 in Accession #:    [id number]     Weight:       196.0 lb  Date of Birth:  1955/12/06       BSA:          1.873 m Patient Age:    39 years       BP:           161/91 mmHg Patient Gender: F              HR:           80 bpm. Exam Location:  [facility] Procedure: 2D Echo Indications:     Stroke 434.91 / I163.9  History:        Patient has no prior history of Echocardiogram examinations.                 Stroke, Signs/Symptoms:Fever; Risk Factors:Hypertension,                 Diabetes and Non-Smoker. Cancer, Vertebral Artery Dissection,                 GERD.  Sonographer:    Leavy Cella Referring Phys: [id number] [place]  1. Left ventricular ejection fraction, by estimation, is 60 to 65%. The left ventricle has normal function. The left ventricle has no regional wall motion abnormalities. There is mild concentric left ventricular hypertrophy. Left ventricular diastolic parameters are indeterminate.  2. Right ventricular systolic function is normal. The right ventricular size is normal.  3. The mitral valve is normal in structure. Mild mitral valve regurgitation. No evidence of mitral stenosis.  4. The aortic valve is grossly normal. There is mild calcification of the aortic valve. There is mild thickening of the aortic valve. Aortic valve regurgitation is not visualized. No aortic stenosis is present.  5. The inferior vena cava is normal in size with greater than 50% respiratory variability, suggesting right atrial pressure of 3 mmHg. Comparison(s): No prior Echocardiogram. Conclusion(s)/Recommendation(s): No intracardiac source of embolism detected on this transthoracic study. A transesophageal echocardiogram is recommended to exclude cardiac source of embolism if clinically indicated. FINDINGS  Left Ventricle: Left ventricular ejection fraction, by estimation, is 60 to 65%. The left ventricle has normal function. The left ventricle has no regional wall motion abnormalities. The left ventricular internal cavity size was normal in size. There is  mild concentric left ventricular hypertrophy. Left ventricular diastolic parameters are indeterminate. Right Ventricle: The right ventricular size is normal. Right vetricular wall thickness was not well visualized. Right ventricular  systolic function is normal. Left Atrium: Left atrial size was normal in size. Right Atrium: Right atrial size was normal in size. Pericardium: There is no evidence of pericardial effusion. Mitral Valve: The mitral valve is normal in structure. There is mild thickening of the mitral valve leaflet(s). Mild mitral valve regurgitation. No evidence of mitral valve stenosis. Tricuspid Valve: The tricuspid valve is normal in structure. Tricuspid valve regurgitation is mild. Aortic Valve: The aortic valve is grossly normal. There is mild calcification of the aortic valve. There is mild thickening of the aortic valve. Aortic valve regurgitation is not visualized. No aortic stenosis is present. Pulmonic Valve: The pulmonic valve was not well visualized. Pulmonic valve regurgitation is not visualized. Aorta: The aortic root, ascending aorta and aortic arch are all structurally normal, with no evidence of dilitation or obstruction. Venous: The inferior vena cava is normal in size with greater than 50% respiratory variability, suggesting right atrial pressure of 3 mmHg. IAS/Shunts: The atrial septum is grossly normal.  LEFT VENTRICLE PLAX 2D LVIDd:  3.90 cm  Diastology LVIDs:         2.40 cm  LV e' medial:    5.03 cm/s LV PW:         1.70 cm  LV E/e' medial:  13.3 LV IVS:        1.20 cm  LV e' lateral:   6.53 cm/s LVOT diam:     1.80 cm  LV E/e' lateral: 10.2 LVOT Area:     2.54 cm  RIGHT VENTRICLE RV S prime:     15.50 cm/s LEFT ATRIUM             Index       RIGHT ATRIUM           Index LA diam:        4.10 cm 2.19 cm/m  RA Area:     11.60 cm LA Vol (A2C):   44.7 ml 23.87 ml/m RA Volume:   24.10 ml  12.87 ml/m LA Vol (A4C):   15.7 ml 8.38 ml/m LA Biplane Vol: 28.1 ml 15.01 ml/m   AORTA Ao Root diam: 2.60 cm MITRAL VALVE               TRICUSPID VALVE MV Area (PHT): 3.85 cm    TR Peak grad:   33.4 mmHg MV Decel Time: 197 msec    TR Vmax:        289.00 cm/s MR Peak grad: 68.9 mmHg MR Vmax:      415.00 cm/s  SHUNTS  MV E velocity: 66.80 cm/s  Systemic Diam: 1.80 cm MV A velocity: 72.00 cm/s MV E/A ratio:  0.93 Deborah Dresser MD Electronically signed by Deborah Dresser MD Signature Date/Time: 03/20/2020/5:24:45 PM    Final    CT HEAD CODE STROKE WO CONTRAST  Result Date: 03/20/2020 CLINICAL DATA:  Code stroke.  Bilateral arm and leg weakness EXAM: CT HEAD WITHOUT CONTRAST TECHNIQUE: Contiguous axial images were obtained from the base of the skull through the vertex without intravenous contrast. COMPARISON:  None. FINDINGS: Brain: There is no mass, hemorrhage or extra-axial collection. The size and configuration of the ventricles and extra-axial CSF spaces are normal. Dense basal ganglia mineralization. Old left thalamic small vessel infarct. There is periventricular hypoattenuation compatible with chronic microvascular disease. Vascular: Atherosclerotic calcification of the internal carotid arteries at the skull base. No abnormal hyperdensity of the major intracranial arteries or dural venous sinuses. Skull: The visualized skull base, calvarium and extracranial soft tissues are normal. Sinuses/Orbits: No fluid levels or advanced mucosal thickening of the visualized paranasal sinuses. No mastoid or middle ear effusion. The orbits are normal. ASPECTS [facility] Stroke Program Early CT Score) - Ganglionic level infarction (caudate, lentiform nuclei, internal capsule, insula, M1-M3 cortex): 7 - Supraganglionic infarction (M4-M6 cortex): 3 Total score (0-10 with 10 being normal): 10 IMPRESSION: 1. No acute intracranial abnormality. 2. ASPECTS is 10. 3. Chronic microvascular ischemia and old left thalamic small vessel infarct. These results were communicated to Dr. Lesleigh Noe at 1:22 am on 03/20/2020 by text page via the [facility] messaging system. Electronically Signed   By: Ulyses Jarred M.D.   On: 03/20/2020 01:22    Assessment/Plan: Diagnosis: ?  Cervical cord ischemia Labs independently reviewed.  Records  reviewed and summated above.  1. Does the need for close, 24 hr/day medical supervision in concert with the patient's rehab needs make it unreasonable for this patient to be served in a less intensive setting? Yes 2. Co-Morbidities requiring supervision/potential complications: T2 DM with  hyperglycemia (Monitor in accordance with exercise and adjust meds as necessary), HTN (monitor and provide prns in accordance with increased physical exertion and pain), breast cancer with nodal mets, prior strokes, bradycardia (monitor heart rate with increased exertion) 3. Due to safety, disease management and patient education, does the patient require 24 hr/day rehab nursing? Yes 4. Does the patient require coordinated care of a physician, rehab nurse, therapy disciplines of PT/OT to address physical and functional deficits in the context of the above medical diagnosis(es)? Yes Addressing deficits in the following areas: balance, endurance, locomotion, strength, transferring, bathing, dressing, feeding, grooming, toileting and psychosocial support 5. Can the patient actively participate in an intensive therapy program of at least 3 hrs of therapy per day at least 5 days per week? Yes 6. The potential for patient to make measurable gains while on inpatient rehab is excellent 7. Anticipated functional outcomes upon discharge from inpatient rehab are modified independent and supervision  with PT, modified independent and supervision with OT, n/a with SLP. 8. Estimated rehab length of stay to reach the above functional goals is: 12-16 days. 9. Anticipated discharge destination: Home 10. Overall Rehab/Functional Prognosis: good  RECOMMENDATIONS: This patient's condition is appropriate for continued rehabilitative care in the following setting: CIR if patient amenable and adequate caregiver support available upon discharge. Patient has agreed to participate in recommended program. Potentially Note that insurance  prior authorization may be required for reimbursement for recommended care.  Comment: Rehab Admissions Coordinator to follow up.  I have personally performed a face to face diagnostic evaluation, including, but not limited to relevant history and physical exam findings, of this patient and developed relevant assessment and plan.  Additionally, I have reviewed and concur with the physician assistant's documentation above.   Delice Lesch, MD, ABPMR Bary Leriche, PA-C 03/21/2020

## 2020-03-21 NOTE — Plan of Care (Signed)

## 2020-03-21 NOTE — Care Plan (Signed)
Nursing asked about foley. May be at risk of bladder dysfunction due to spinal cord stroke. Will attempt removal and monitor bladder scans.   Lesleigh Noe MD-PhD [facility] [phone] Available 7 PM to 7 AM, outside of these hours please call Stroke team or Neurologist on call as listed on Amion.

## 2020-03-21 NOTE — Progress Notes (Addendum)
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  She reports she was in her usual state of health when she began to have left-sided neck pain while sitting and watching TV.  This was then associated with dizziness, pain all over her body and generalized weakness.  On EMS evaluation this was slightly worse on the left than the right and therefore code stroke was activated.  She was given tPA at 147pm.    On exam this morning, she was sitting up in the chair working with PT/OT.  awake, alert and oriented x3. PERRL, EOMI, face symmetric, follows commands x 4, left upper extremity 4-, right upper extremities 4/5, bilateral lower extremities 4-/5. Mild decrease sensation on the left. intact light touch, pinprick, temperature, vibration and proprioception sensation.  Intact finger-to-nose although slow on the left. Generalized pain improved.     Vitals:   03/21/20 0300 03/21/20 0400 03/21/20 0500 03/21/20 0600  BP: 131/77 136/77 125/72 (!) 149/88  Pulse: 62 (!) 57 60 64  Resp: 16 17 19 18   Temp: 98 F (36.7 C)     TempSrc: Oral     SpO2: 98% 99% 96% 97%  Weight:  88.9 kg    Height:       CBC:  Recent Labs  Lab 03/20/20 0102 03/20/20 0114 03/21/20 0535  WBC 6.8  --  7.3  NEUTROABS 2.9  --   --   HGB 11.7* 12.6 12.1  HCT 37.6 37.0 38.2  MCV 87.6  --  87.2  PLT 201  --  102   Basic Metabolic Panel:  Recent Labs  Lab 03/20/20 0102 03/20/20 0114 03/21/20 0535  NA 137 141 140  K 3.3* 3.3* 4.3  CL 104 104 107  CO2 23  --  25  GLUCOSE 144* 143* 119*  BUN 7* 8 7*  CREATININE 0.81 0.70 0.96  CALCIUM 8.8*  --  8.8*  MG  --   --  2.0   Lipid Panel:  Recent Labs  Lab 03/20/20 0507  CHOL 222*  TRIG 72  HDL 52  CHOLHDL 4.3  VLDL 14  LDLCALC 156*   HgbA1c:  Recent Labs  Lab 03/20/20 0507  HGBA1C 6.5*   Urine Drug Screen:  Recent Labs  Lab  03/20/20 0102  LABOPIA NONE DETECTED  COCAINSCRNUR NONE DETECTED  LABBENZ NONE DETECTED  AMPHETMU NONE DETECTED  THCU NONE DETECTED  LABBARB NONE DETECTED    Alcohol Level  Recent Labs  Lab 03/20/20 0102  ETH <10    IMAGING past 24 hours CT HEAD WO CONTRAST  Result Date: 03/21/2020 IMPRESSION:  Chronic small vessel disease without acute intracranial abnormality.   MR CERVICAL SPINE W CONTRAST  Result Date: 03/20/2020  IMPRESSION:  1. There is faint contrast enhancement associated with the area of increased T2 signal without evidence of nodularity or mass effect. Findings are suggestive of cord ischemia.  2. Contrast enhancement within the visualized left vertebral artery likely represents enhancing thrombus.   ECHOCARDIOGRAM COMPLETE  Result Date: 03/20/2020 IMPRESSIONS   1. Left ventricular ejection fraction, by estimation, is 60 to 65%. The left ventricle has normal function. The left ventricle has no regional wall motion abnormalities. There is mild concentric left ventricular hypertrophy. Left ventricular diastolic parameters are indeterminate.  2. Right ventricular systolic function is normal. The right ventricular size is normal.   3. The mitral valve is  normal in structure. Mild mitral valve regurgitation. No evidence of mitral stenosis.   4. The aortic valve is grossly normal. There is mild calcification of the aortic valve. There is mild thickening of the aortic valve. Aortic valve regurgitation is not visualized. No aortic stenosis is present.   5. The inferior vena cava is normal in size with greater than 50% respiratory variability, suggesting right atrial pressure of 3 mmHg.    PHYSICAL EXAM Physical Exam  Constitutional: Appears well-developed and well-nourished.  Psych: Affect flat, occasionally somewhat anxious Eyes: No scleral injection HENT: No oropharyngeal obstruction.  MSK: no joint deformities.  Cardiovascular: Normal rate and regular rhythm.   Respiratory: Effort normal, non-labored breathing GI: Soft.  No distension. There is no tenderness.  Skin: Warm dry and intact visible skin  Neuro: Mental Status: Patient is awake, alert, oriented to person, place, month, year, and situation, though she generally speaks very slowly / hesitantly  Patient is able to give a clear and coherent history. Cranial Nerves: II: Visual Fields are full. Pupils are equal, round, and reactive to light.   III,IV, VI: EOMI without ptosis or diploplia.  V: Facial sensation is symmetric to light touch VII: Facial movement is symmetric.  VIII: hearing is intact to voice X: Uvula elevates symmetrically XI: Shoulder shrug is symmetric. XII: tongue is midline without atrophy or fasciculations.  Motor: Tone is normal. Bulk is normal.  LUE 4- RUE 4 LLE 4- RLE 4- Sensory: Decrease sensation in the left upper extremities Deep Tendon Reflexes: 2+ and symmetric in the biceps.  ASSESSMENT/PLAN [name] is a 65 y.o. female with history of breast cancer, HTN, prior strokes presenting with generalized weakness, dizziness, and pain. Concern for stroke. CT head no acute abnormalities or hemorrhage.  tPA given on 03/20/2020 at 1:47am.  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   MRI showed no stroke in the brain, however MRI C-spine without contrast C4 spinal cord anterior portion faint T2 flair signal.  MRI C-spine with contrast showed possible cord ischemia.  EF 60 to 65%, A1c 6.5, LDL 156, UDS negative. Creatinine 0.70.  ? cervical cord ischemia  Code Stroke CT head No acute abnormality. ASPECTS 10    CTA head & neck: Occlusion of the left vertebral artery at its origin with reconstitution of the distal left V4 segment. Approximately 50% stenosis of the proximal right ICA secondary to low density atherosclerosis.  MRI C-spine wo - Abnormal signal in the ventral cervical spinal cord centered at the lower C4 level  MRI  C-spine w contrast: faint contrast enhancement associated with the area of increased T2 signal without evidence of nodularity or mass effect. Findings are suggestive of cord ischemia.   MRI brain: Severe intracranial small vessel disease   CT head: 24 hour post TPA: Chronic small vessel disease without acute intracranial abnormality.  2D Echo EF 60-65%  LDL 156  HgbA1c 6.5  VTE prophylaxis - lovenox 40mg  daily  Heart healthy diet  No antithrombotic prior to admission,  aspirin 81 mg daily and clopidogrel 75 mg daily  DAPT for 3 weeks and then ASA alone.   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 Acute  s/p tPA  Do not feel related to cervical cord ischemia  Hx of stroke  Admitted on 03/03/2018 ischemic stroke. At that time she developed sudden onset of right-sided numbness and weakness with near  syncopal episode. Patient was briefly aphasic and confused with slurring speech. CTA head/neck (03/03/2018) Right ICA origin and bulb resulting in up to 50% stenosis. Superimposed bilateral ICA siphon calcified plaque with mild Left and up to moderate Right ICA siphon stenoses. CT Perfusion (03/03/2018) detect no core infarct or penumbra. MRI Brain (03/03/2018) 10 mm focus of acute/early subacute infarction within the left lateral thalamus and posterior limb of internal capsule. HgbA1C 6.8, LDL 151. On Aspirin 81 and Plavix for 1 month then Aspirin alone. She received home health PT/OT/ST following 02/2018 discharge   Hypertension  Home meds:  Metoprolol 25mg  daily and hygroton 25  Stable  Resume home meds  SBP goal <180 for 24 hours after TPA administration . Long-term BP goal normotensive  Hyperlipidemia  Home meds:  zetia 10mg  daily, resumed in hospital  LDL 156, goal < 70  Add lipitor 40  Continue zetia and statin at discharge  Borderline Diabetes   Home meds: Metformin 500mg  daily  HgbA1c 6.5,  goal < 7.0  CBGs  SSI  PCP follow up  Other Stroke Risk Factors  Obesity, Body mass index is 37.03 kg/m., BMI >/= 30 associated with increased stroke risk, recommend weight loss, diet and exercise as appropriate   Hx of stroke (03/03/2018) Left lateral thalamus and posterior internal capsule Stroke  Other Active Problems  GERD  Breast cancer  Hospital day # 1   ATTENDING NOTE: I reviewed above note and agree with the assessment and plan. Pt was seen and examined.   Pt doing well, continue to improve. She was sitting in chair, awake alert, orientated and interactive. Speech less hypophonia, no significant dysarthria. Following all simple commands. CN intact. RUE 4/5, LUE 3/5, RLE 4+/5, LLE 3+/5. Sensation symmetrical. DTR 2+. FTN intact slow on the left.   Per medical record, pt had stroke in 02/2018 with right sided numbness and weakness and near-syncope. MRI showed left thalamic and PLIC infarct, most likely small vessel disease. She did have right ICA 50% stenosis with mixed plaques back then.   This admission, the presentation was different, more like whole body pain and generalized weakness, which could be consistent with spinal cord ischemia but imaging is not very typical. But given her atherosclerosis, stroke risk factors, chronic significant WM ischemic changes, cord ischemia likely the best working diagnosis. Not sure if VA occlusion is acute or chronic, if we think the VA thrombus related to cord infarct, I certainly would expect cerebellar stroke but this was not seen on MRI brain. We have entertained with spinal cord demyelination, but felt less likely at this time.   Will continue ASA and plavix DAPT for 3 weeks and then ASA alone. Will continue statin and zetia for stroke prevention. PT/OT recommend CIR.   For detailed assessment and plan, please refer to above as I have made changes wherever appropriate.   Rosalin Hawking, MD PhD Stroke Neurology 03/21/2020 5:31  PM    To contact Stroke Continuity provider, please refer to [email or web address] After hours, contact General Neurology

## 2020-03-21 NOTE — Evaluation (Signed)
Occupational Therapy Evaluation Patient Details Name: Deborah [name] MRN: [id number] DOB: 17-Jun-1955 Today's Date: 03/21/2020    History of Present Illness 65yoF with a past medical history significant for breast cancer (locally metastatic to lymph nodes), hypertension, and prior strokes.  She reports she was in her usual state of health when she began to have left-sided neck pain while sitting and watching TV.  This was then associated with dizziness, pain all over her body and generalized weakness.  L hemiparesis worse than R hemiparesis.   Clinical Impression   Patient admitted for the diagnosis above.  She received tPA prior date, and session cleared with primary nurse.  Patient PTA was independent, continued to work, and cared for her grandchildren ranging in ages from 73 to 41 years of age.  Currently, due to the deficits listed below, she is needing up to mod A for grooming, Mod A for upper body ADL, Max A for lower body ADL, and Max A for squat pivot transfers.  PT eval is pending, but OT to recommends CIR and will continue to follow in the acute setting to maximize functional status.      Follow Up Recommendations  CIR    Equipment Recommendations  Tub/shower seat;3 in 1 bedside commode    Recommendations for Other Services       Precautions / Restrictions Precautions Precautions: Fall Precaution Comments: Monitor BP Restrictions Weight Bearing Restrictions: No      Mobility Bed Mobility Overal bed mobility: Needs Assistance Bed Mobility: Rolling;Supine to Sit Rolling: Mod assist   Supine to sit: Mod assist;HOB elevated          Transfers Overall transfer level: Needs assistance   Transfers: Squat Pivot Transfers     Squat pivot transfers: Mod assist;Max assist     General transfer comment: +2 for safety.  Transfer to R side.    Balance Overall balance assessment: Needs assistance Sitting-balance support: No upper extremity supported;Feet  supported Sitting balance-Leahy Scale: Fair       Standing balance-Leahy Scale: Poor                             ADL either performed or assessed with clinical judgement   ADL Overall ADL's : Needs assistance/impaired Eating/Feeding: Minimal assistance;Sitting   Grooming: Wash/dry hands;Wash/dry face;Moderate assistance;Sitting   Upper Body Bathing: Moderate assistance;Sitting   Lower Body Bathing: Maximal assistance;Sitting/lateral leans   Upper Body Dressing : Moderate assistance;Sitting   Lower Body Dressing: Maximal assistance;Sitting/lateral leans               Functional mobility during ADLs: Moderate assistance       Vision Baseline Vision/History: Wears glasses Wears Glasses: At all times Patient Visual Report: No change from baseline       Perception     Praxis      Pertinent Vitals/Pain Pain Assessment: No/denies pain     Hand Dominance Right   Extremity/Trunk Assessment Upper Extremity Assessment Upper Extremity Assessment: Generalized weakness   Lower Extremity Assessment Lower Extremity Assessment: Generalized weakness   Cervical / Trunk Assessment Cervical / Trunk Assessment: Kyphotic   Communication Communication Communication: No difficulties   Cognition Arousal/Alertness: Awake/alert Behavior During Therapy: WFL for tasks assessed/performed Overall Cognitive Status: Within Functional Limits for tasks assessed  General Comments   BP upon entering 178/100, but reduced to 146/92 after morning meds and transfer to the recliner.     Exercises     Shoulder Instructions      Home Living Family/patient expects to be discharged to:: Inpatient rehab                                        Prior Functioning/Environment Level of Independence: Independent        Comments: Cares for 5 grandchildren ranging in ages from 56 to 63.  Patient works full time  and needed no assist for ADL or mobility.        OT Problem List: Decreased strength;Decreased range of motion;Decreased activity tolerance;Impaired balance (sitting and/or standing);Decreased coordination;Impaired UE functional use      OT Treatment/Interventions: Self-care/ADL training;Therapeutic exercise;Neuromuscular education;DME and/or AE instruction;Balance training;Therapeutic activities    OT Goals(Current goals can be found in the care plan section) Acute Rehab OT Goals Patient Stated Goal: I want to move and get back to [facility] OT Goal Formulation: With patient Time For Goal Achievement: 04/04/20 Potential to Achieve Goals: Good ADL Goals Pt Will Perform Eating: with set-up Pt Will Perform Grooming: with set-up;sitting Pt Will Perform Upper Body Bathing: with min guard assist;sitting Pt Will Perform Upper Body Dressing: with min guard assist;sitting Pt Will Transfer to Toilet: with min assist;stand pivot transfer;bedside commode Pt/caregiver will Perform Home Exercise Program: Increased ROM;Increased strength;Both right and left upper extremity;With minimal assist  OT Frequency: Min 2X/week   Barriers to D/C:    none noted       Co-evaluation              AM-PAC OT "6 Clicks" Daily Activity     Outcome Measure Help from another person eating meals?: A Little Help from another person taking care of personal grooming?: A Lot Help from another person toileting, which includes using toliet, bedpan, or urinal?: A Lot Help from another person bathing (including washing, rinsing, drying)?: A Lot Help from another person to put on and taking off regular upper body clothing?: A Lot Help from another person to put on and taking off regular lower body clothing?: A Lot 6 Click Score: 13   End of Session Equipment Utilized During Treatment: Gait belt Nurse Communication: Mobility status  Activity Tolerance: Patient tolerated treatment well Patient left: in chair;with  call bell/phone within reach  OT Visit Diagnosis: Unsteadiness on feet (R26.81);Muscle weakness (generalized) (M62.81);Hemiplegia and hemiparesis Hemiplegia - Right/Left: Left Hemiplegia - dominant/non-dominant: Non-Dominant Hemiplegia - caused by: Unspecified                Time: 0912-0939 OT Time Calculation (min): 27 min Charges:  OT General Charges $OT Visit: 1 Visit OT Evaluation $OT Eval Moderate Complexity: 1 Mod OT Treatments $Self Care/Home Management : 8-22 mins  03/21/2020  Rich, OTR/L  Acute Rehabilitation Services  Office:  [place] 03/21/2020, 9:58 AM

## 2020-03-22 LAB — CBC
HCT: 37.7 % (ref 36.0–46.0)
Hemoglobin: 12.1 g/dL (ref 12.0–15.0)
MCH: 27.6 pg (ref 26.0–34.0)
MCHC: 32.1 g/dL (ref 30.0–36.0)
MCV: 85.9 fL (ref 80.0–100.0)
Platelets: 224 10*3/uL (ref 150–400)
RBC: 4.39 MIL/uL (ref 3.87–5.11)
RDW: 16.3 % — ABNORMAL HIGH (ref 11.5–15.5)
WBC: 7.4 10*3/uL (ref 4.0–10.5)
nRBC: 0 % (ref 0.0–0.2)

## 2020-03-22 LAB — BASIC METABOLIC PANEL
Anion gap: 10 (ref 5–15)
BUN: 11 mg/dL (ref 8–23)
CO2: 24 mmol/L (ref 22–32)
Calcium: 8.9 mg/dL (ref 8.9–10.3)
Chloride: 104 mmol/L (ref 98–111)
Creatinine, Ser: 0.79 mg/dL (ref 0.44–1.00)
GFR, Estimated: >60 mL/min (ref >60)
Glucose, Bld: 120 mg/dL — ABNORMAL HIGH (ref 70–99)
Potassium: 3.7 mmol/L (ref 3.5–5.1)
Sodium: 138 mmol/L (ref 135–145)

## 2020-03-22 NOTE — Progress Notes (Signed)
Physical Therapy Treatment Patient Details Name: Deborah [name] MRN: [id number] DOB: 11-06-1955 Today's Date: 03/22/2020    History of Present Illness 43yoF with a past medical history significant for breast cancer (locally metastatic to lymph nodes), hypertension, and prior strokes.  She reports she was in her usual state of health when she began to have left-sided neck pain while sitting and watching TV.  This was then associated with dizziness, pain all over her body and generalized weakness.  L hemiparesis worse than R hemiparesis, TPA administered. Pt with questionable spinal cord ischemia, MRI showing C4 and T2 signal abnormalities.    PT Comments    Pt resting upon arrival to room, wakes easily and is eager to mobilize. Pt continuing to demonstrate marked LE weakness, and well as bilat UE weakness proximal>distal. Pt tolerating repeated sit<>stands during session, as well as x2 short room-distance ambulation with min to mod assist overall. Pt with very effortful gait, with PT providing bodyweight support especially as pt fatigues. Pt remains a great CIR candidate, will continue to follow acutely and progress mobility as able.   Follow Up Recommendations  CIR     Equipment Recommendations  Rolling walker with 5" wheels;3in1 (PT)    Recommendations for Other Services       Precautions / Restrictions Precautions Precautions: Fall Restrictions Weight Bearing Restrictions: No    Mobility  Bed Mobility Overal bed mobility: Needs Assistance Bed Mobility: Supine to Sit     Supine to sit: Mod assist;HOB elevated     General bed mobility comments: Mod assist for trunk elevation and LE lowering over EOB. Increased time, pt able to initiate chest rise off bed.    Transfers Overall transfer level: Needs assistance Equipment used: Rolling walker (2 wheeled) Transfers: Sit to/from Stand Sit to Stand: Min assist;From elevated surface Stand pivot transfers: Min assist        General transfer comment: Min assist for power up, rise, steady, and pivot to [facility] towards pt's L. Verbal cuing for sequencing, assist for RW pivot. STS x3, from EOB x2 and BSC x1.  Ambulation/Gait Ambulation/Gait assistance: Min assist Gait Distance (Feet): 5 Feet (x2) Assistive device: Rolling walker (2 wheeled) Gait Pattern/deviations: Shuffle;Drifts right/left;Trunk flexed;Step-to pattern;Decreased step length - right;Decreased dorsiflexion - left;Decreased dorsiflexion - right;Decreased step length - left Gait velocity: decr Gait velocity interpretation: <1.31 ft/sec, indicative of household ambulator General Gait Details: min assist to steady, support pt bodyweight with fatigue, verbal cuing for upright posture, looking forward as opposed to at feet, and increasing foot clearance. Seated rest break x1 lasting 2 minutes to recover LE fatigue.   Stairs             Wheelchair Mobility    Modified Rankin (Stroke Patients Only) Modified Rankin (Stroke Patients Only) Pre-Morbid Rankin Score: No symptoms Modified Rankin: Moderately severe disability     Balance Overall balance assessment: Needs assistance Sitting-balance support: No upper extremity supported;Feet supported Sitting balance-Leahy Scale: Fair     Standing balance support: Bilateral upper extremity supported;During functional activity Standing balance-Leahy Scale: Poor Standing balance comment: reliant on UE support                            Cognition Arousal/Alertness: Awake/alert Behavior During Therapy: WFL for tasks assessed/performed Overall Cognitive Status: Within Functional Limits for tasks assessed  Exercises      General Comments        Pertinent Vitals/Pain Pain Assessment: Faces Faces Pain Scale: No hurt Pain Intervention(s): Monitored during session    Home Living                      Prior Function             PT Goals (current goals can now be found in the care plan section) Acute Rehab PT Goals Patient Stated Goal: I want to move and get back to work PT Goal Formulation: With patient Time For Goal Achievement: 04/04/20 Potential to Achieve Goals: Good Progress towards PT goals: Progressing toward goals    Frequency    Min 3X/week      PT Plan Current plan remains appropriate    Co-evaluation              AM-PAC PT "6 Clicks" Mobility   Outcome Measure  Help needed turning from your back to your side while in a flat bed without using bedrails?: A Lot Help needed moving from lying on your back to sitting on the side of a flat bed without using bedrails?: A Lot Help needed moving to and from a bed to a chair (including a wheelchair)?: A Lot Help needed standing up from a chair using your arms (e.g., wheelchair or bedside chair)?: A Little Help needed to walk in hospital room?: A Little Help needed climbing 3-5 steps with a railing? : A Lot 6 Click Score: 14    End of Session Equipment Utilized During Treatment: Gait belt Activity Tolerance: Patient limited by fatigue Patient left: in chair;with call bell/phone within reach;with chair alarm set Nurse Communication: Mobility status PT Visit Diagnosis: Unsteadiness on feet (R26.81);Muscle weakness (generalized) (M62.81)     Time: 6286-3817 PT Time Calculation (min) (ACUTE ONLY): 27 min  Charges:  $Gait Training: 8-22 mins $Therapeutic Activity: 8-22 mins                    Stacie Glaze, PT Acute Rehabilitation Services Pager [phone]  Office [phone]   [place] 03/22/2020, 4:12 PM

## 2020-03-22 NOTE — Progress Notes (Addendum)
Inpatient Rehab Admissions Coordinator:   Met with patient at the bedside.  We discussed goals/expectations of CIR to include length of stay about 2 weeks, with goals of supervision to mod I.  Pt lives at home with several grand children (the oldest is 32), and reports that her daughter may be able to come stay with her if she needs extra help.  With intermittent supervision goals this should be sufficient.  We discussed that I would need to start insurance authorization today and I will follow up with her as soon as I hear something.   Addendum 5800: Pt with [place].  Let TOC know.  If pt would like to proceed with evaluating out of network benefits I can check those tomorrow.  Otherwise will sign off.   Shann Medal, PT, DPT Admissions Coordinator [phone] 03/22/20  4:08 PM

## 2020-03-22 NOTE — Progress Notes (Signed)
STROKE TEAM PROGRESS NOTE   INTERVAL HISTORY Pt sitting in chair, stated she feels better today. She was able to walk with walker with PT transition from bed to chair. PT/OT recommend CIR.  Vitals:   03/22/20 0317 03/22/20 0728 03/22/20 1004 03/22/20 1127  BP: (!) 148/72 138/81 (!) 148/84 138/78  Pulse: 62 (!) 53 (!) 50 (!) 53  Resp: 18 16    Temp: 98.8 F (37.1 C) 98.2 F (36.8 C)  98.1 F (36.7 C)  TempSrc: Axillary Oral  Oral  SpO2: 98% 99%  100%  Weight:      Height:       CBC:  Recent Labs  Lab 03/20/20 0102 03/20/20 0114 03/21/20 0535 03/22/20 0323  WBC 6.8  --  7.3 7.4  NEUTROABS 2.9  --   --   --   HGB 11.7*   < > 12.1 12.1  HCT 37.6   < > 38.2 37.7  MCV 87.6  --  87.2 85.9  PLT 201  --  224 224   < > = values in this interval not displayed.   Basic Metabolic Panel:  Recent Labs  Lab 03/21/20 0535 03/22/20 0323  NA 140 138  K 4.3 3.7  CL 107 104  CO2 25 24  GLUCOSE 119* 120*  BUN 7* 11  CREATININE 0.96 0.79  CALCIUM 8.8* 8.9  MG 2.0  --    Lipid Panel:  Recent Labs  Lab 03/20/20 0507  CHOL 222*  TRIG 72  HDL 52  CHOLHDL 4.3  VLDL 14  LDLCALC 156*   HgbA1c:  Recent Labs  Lab 03/20/20 0507  HGBA1C 6.5*   Urine Drug Screen:  Recent Labs  Lab 03/20/20 0102  LABOPIA NONE DETECTED  COCAINSCRNUR NONE DETECTED  LABBENZ NONE DETECTED  AMPHETMU NONE DETECTED  THCU NONE DETECTED  LABBARB NONE DETECTED    Alcohol Level  Recent Labs  Lab 03/20/20 0102  ETH <10    IMAGING past 24 hours  CT HEAD WO CONTRAST Result Date: 03/21/2020 IMPRESSION:  Chronic small vessel disease without acute intracranial abnormality.   MR CERVICAL SPINE W CONTRAST Result Date: 03/20/2020  IMPRESSION:  1. There is faint contrast enhancement associated with the area of increased T2 signal without evidence of nodularity or mass effect. Findings are suggestive of cord ischemia.  2. Contrast enhancement within the visualized left vertebral artery likely  represents enhancing thrombus.   ECHOCARDIOGRAM COMPLETE Result Date: 03/20/2020 IMPRESSIONS   1. Left ventricular ejection fraction, by estimation, is 60 to 65%. The left ventricle has normal function. The left ventricle has no regional wall motion abnormalities. There is mild concentric left ventricular hypertrophy. Left ventricular diastolic parameters are indeterminate.  2. Right ventricular systolic function is normal. The right ventricular size is normal.   3. The mitral valve is normal in structure. Mild mitral valve regurgitation. No evidence of mitral stenosis.   4. The aortic valve is grossly normal. There is mild calcification of the aortic valve. There is mild thickening of the aortic valve. Aortic valve regurgitation is not visualized. No aortic stenosis is present.   5. The inferior vena cava is normal in size with greater than 50% respiratory variability, suggesting right atrial pressure of 3 mmHg.    PHYSICAL EXAM Physical Exam  Constitutional: Appears well-developed and well-nourished.  Psych: Affect flat, occasionally somewhat anxious Eyes: No scleral injection HENT: No oropharyngeal obstruction.  MSK: no joint deformities.  Cardiovascular: Normal rate and regular rhythm.  Respiratory: Effort normal,  non-labored breathing GI: Soft.  No distension. There is no tenderness.  Skin: Warm dry and intact visible skin  Neuro: Mental Status: Patient is awake, alert, oriented to person, place, month, year, and situation, though she generally speaks very slowly / hesitantly  Patient is able to give a clear and coherent history. Cranial Nerves: II: Visual Fields are full. Pupils are equal, round, and reactive to light.   III,IV, VI: EOMI without ptosis or diploplia.  V: Facial sensation is symmetric to light touch VII: Facial movement is symmetric.  VIII: hearing is intact to voice X: Uvula elevates symmetrically XI: Shoulder shrug is symmetric. XII: tongue is midline without  atrophy or fasciculations.  Motor: Tone is normal. Bulk is normal.  LUE 4- RUE 4 LLE 3+ RLE 4- Sensory: Decrease sensation in the left upper extremities Deep Tendon Reflexes: 2+ and symmetric in the biceps.  ASSESSMENT/PLAN [name] is a 65 y.o. female with history of breast cancer, HTN, and prior strokes presenting with generalized weakness, dizziness, and pain. Concern for stroke. CT head no acute abnormalities or hemorrhage.  tPA given on 03/20/2020 at 1:47am.  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   MRI showed no stroke in the brain, however MRI C-spine without contrast C4 spinal cord anterior portion faint T2 flair signal.  MRI C-spine with contrast showed possible cord ischemia.  EF 60 to 65%, A1c 6.5, LDL 156, UDS negative. Creatinine 0.70.  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 ASPECTS 10    CTA head & neck: Occlusion of the left vertebral artery at its origin with reconstitution of the distal left V4 segment. Approximately 50% stenosis of the proximal right ICA secondary to low density atherosclerosis.  MRI C-spine wo - Abnormal signal in the ventral cervical spinal cord centered at the lower C4 level  MRI C-spine w contrast: faint contrast enhancement associated with the area of increased T2 signal without evidence of nodularity or mass effect. Findings are suggestive of cord ischemia.   MRI brain: Severe intracranial small vessel disease   CT head: 24 hour post TPA: Chronic small vessel disease without acute intracranial abnormality.  2D Echo EF 60-65%  LDL 156  HgbA1c 6.5  VTE prophylaxis - lovenox 40mg  daily  Heart healthy diet  No antithrombotic prior to admission,  aspirin 81 mg daily and clopidogrel 75 mg daily  DAPT for 3 weeks and then ASA alone.   Therapy recommendations:  CIR  Disposition:  pending  Left vertebral occlusion  Found on CTA head/neck  Chronic vs. Acute  s/p  tPA  Do not feel related to cervical cord ischemia  Hx of stroke  Admitted on 03/03/2018 ischemic stroke. At that time she developed sudden onset of right-sided numbness and weakness with near syncopal episode. Patient was briefly aphasic and confused with slurring speech. CTA head/neck (03/03/2018) Right ICA origin and bulb resulting in up to 50% stenosis. Superimposed bilateral ICA siphon calcified plaque with mild Left and up to moderate Right ICA siphon stenoses. CT Perfusion (03/03/2018) detect no core infarct or penumbra. MRI Brain (03/03/2018) 10 mm focus of acute/early subacute infarction within the left lateral thalamus and posterior limb of internal capsule. HgbA1C 6.8, LDL 151. On Aspirin 81 and Plavix for 1 month then Aspirin alone. She received home health PT/OT/ST following 02/2018 discharge   Hypertension  Home meds:  Metoprolol 25mg  daily and hygroton 25  Stable  Resume home meds .  Long-term BP goal normotensive  Hyperlipidemia  Home meds:  Zetia 10mg  daily, resumed in hospital  LDL 156, goal < 70  Add lipitor 40  Continue Zetia and statin at discharge  Borderline Diabetes   Home meds: Metformin 500mg  daily  HgbA1c 6.5, goal < 7.0  CBGs  SSI  PCP follow up  Other Stroke Risk Factors  Obesity, Body mass index is 37.03 kg/m., BMI >/= 30 associated with increased stroke risk, recommend weight loss, diet and exercise as appropriate   Hx of stroke (03/03/2018) Left lateral thalamus and posterior internal capsule Stroke  Other Active Problems  GERD  Breast cancer  Hospital day # 2  Rosalin Hawking, MD PhD Stroke Neurology 03/22/2020 7:31 PM         To contact Stroke Continuity provider, please refer to [email or web address] After hours, contact General Neurology

## 2020-03-23 LAB — BASIC METABOLIC PANEL
Anion gap: 12 (ref 5–15)
BUN: 14 mg/dL (ref 8–23)
CO2: 26 mmol/L (ref 22–32)
Calcium: 9.3 mg/dL (ref 8.9–10.3)
Chloride: 100 mmol/L (ref 98–111)
Creatinine, Ser: 1.03 mg/dL — ABNORMAL HIGH (ref 0.44–1.00)
GFR, Estimated: >60 mL/min (ref >60)
Glucose, Bld: 135 mg/dL — ABNORMAL HIGH (ref 70–99)
Potassium: 3.5 mmol/L (ref 3.5–5.1)
Sodium: 138 mmol/L (ref 135–145)

## 2020-03-23 LAB — CBC
HCT: 36.9 % (ref 36.0–46.0)
Hemoglobin: 12.2 g/dL (ref 12.0–15.0)
MCH: 28.2 pg (ref 26.0–34.0)
MCHC: 33.1 g/dL (ref 30.0–36.0)
MCV: 85.2 fL (ref 80.0–100.0)
Platelets: 218 10*3/uL (ref 150–400)
RBC: 4.33 MIL/uL (ref 3.87–5.11)
RDW: 16.3 % — ABNORMAL HIGH (ref 11.5–15.5)
WBC: 8.5 10*3/uL (ref 4.0–10.5)
nRBC: 0 % (ref 0.0–0.2)

## 2020-03-23 NOTE — TOC Initial Note (Signed)
Transition of Care [facility]) - Initial/Assessment Note    Patient Details  Name: Deborah [name] MRN: [id number] Date of Birth: 05-06-1955  Transition of [facility] CM/SW Contact:    Pollie Friar, RN Phone Number: 03/23/2020, 11:39 AM  Clinical Narrative:                 Recommendations are for IR. [facility] IR is not in network with her insurance. CM met with the patient and she is interested in going to [facility]. CM has faxed her information to [facility]: [phone]. Awaiting response.  TOC following.  Expected Discharge Plan: IP Rehab Facility Barriers to Discharge: Continued Medical Work up   Patient Goals and CMS Choice   CMS Medicare.gov Compare Post Acute Care list provided to:: Patient Choice offered to / list presented to : Patient  Expected Discharge Plan and Services Expected Discharge Plan: [place]   Discharge Planning Services: CM Consult Post Acute Care Choice: IP Rehab Living arrangements for the past 2 months: Single Family Home                                      Prior Living Arrangements/Services Living arrangements for the past 2 months: Single Family Home Lives with:: Adult Children Patient language and need for interpreter reviewed:: Yes Do you feel safe going back to the place where you live?: Yes            Criminal Activity/Legal Involvement Pertinent to Current Situation/Hospitalization: No - Comment as needed  Activities of Daily Living      Permission Sought/Granted                  Emotional Assessment Appearance:: Appears stated age Attitude/Demeanor/Rapport: Engaged Affect (typically observed): Accepting Orientation: : Oriented to Self,Oriented to Place,Oriented to  Time,Oriented to Situation   Psych Involvement: No (comment)  Admission diagnosis:  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 Spinal  cord ischemia ([place])   . Controlled type 2 diabetes mellitus with hyperglycemia ([place])   . Essential hypertension   . History of CVA (cerebrovascular accident)   . Bradycardia   . 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 Fever 11/27/2012  . Dehydration 11/27/2012  . UTI (lower urinary tract infection) 11/27/2012   PCP:  Patient, No Pcp Per Pharmacy:   [facility] [place] [address] [place] [address] Phone: [phone] Fax: [phone]     Social Determinants of Health (SDOH) Interventions    Readmission Risk Interventions No flowsheet data found.

## 2020-03-23 NOTE — Progress Notes (Signed)
STROKE TEAM PROGRESS NOTE   INTERVAL HISTORY Pt sitting in chair, eating lunch, stated that his lower extremity more improved, upper extremities improves slowly.  Patient cannot be admitted CIR here due to insurance not in network.  Will have to admit to [facility] likely next week.  Vitals:   03/23/20 0845 03/23/20 1113 03/23/20 1632 03/23/20 1947  BP: 129/71 125/70 121/67 (!) 144/75  Pulse: (!) 59 (!) 55 (!) 54 (!) 59  Resp: 16 18 18 17   Temp: 98 F (36.7 C) 98 F (36.7 C) 98.1 F (36.7 C) 98 F (36.7 C)  TempSrc: Oral Oral Oral Oral  SpO2: 100% 100% 100% 100%  Weight:      Height:       CBC:  Recent Labs  Lab 03/20/20 0102 03/20/20 0114 03/22/20 0323 03/23/20 0346  WBC 6.8   < > 7.4 8.5  NEUTROABS 2.9  --   --   --   HGB 11.7*   < > 12.1 12.2  HCT 37.6   < > 37.7 36.9  MCV 87.6   < > 85.9 85.2  PLT 201   < > 224 218   < > = values in this interval not displayed.   Basic Metabolic Panel:  Recent Labs  Lab 03/21/20 0535 03/22/20 0323 03/23/20 0346  NA 140 138 138  K 4.3 3.7 3.5  CL 107 104 100  CO2 25 24 26   GLUCOSE 119* 120* 135*  BUN 7* 11 14  CREATININE 0.96 0.79 1.03*  CALCIUM 8.8* 8.9 9.3  MG 2.0  --   --    Lipid Panel:  Recent Labs  Lab 03/20/20 0507  CHOL 222*  TRIG 72  HDL 52  CHOLHDL 4.3  VLDL 14  LDLCALC 156*   HgbA1c:  Recent Labs  Lab 03/20/20 0507  HGBA1C 6.5*   Urine Drug Screen:  Recent Labs  Lab 03/20/20 0102  LABOPIA NONE DETECTED  COCAINSCRNUR NONE DETECTED  LABBENZ NONE DETECTED  AMPHETMU NONE DETECTED  THCU NONE DETECTED  LABBARB NONE DETECTED    Alcohol Level  Recent Labs  Lab 03/20/20 0102  ETH <10    IMAGING past 24 hours  CT HEAD WO CONTRAST Result Date: 03/21/2020 IMPRESSION:  Chronic small vessel disease without acute intracranial abnormality.   MR CERVICAL SPINE W CONTRAST Result Date: 03/20/2020  IMPRESSION:  1. There is faint contrast enhancement associated with the area of increased T2  signal without evidence of nodularity or mass effect. Findings are suggestive of cord ischemia.  2. Contrast enhancement within the visualized left vertebral artery likely represents enhancing thrombus.   ECHOCARDIOGRAM COMPLETE Result Date: 03/20/2020 IMPRESSIONS   1. Left ventricular ejection fraction, by estimation, is 60 to 65%. The left ventricle has normal function. The left ventricle has no regional wall motion abnormalities. There is mild concentric left ventricular hypertrophy. Left ventricular diastolic parameters are indeterminate.  2. Right ventricular systolic function is normal. The right ventricular size is normal.   3. The mitral valve is normal in structure. Mild mitral valve regurgitation. No evidence of mitral stenosis.   4. The aortic valve is grossly normal. There is mild calcification of the aortic valve. There is mild thickening of the aortic valve. Aortic valve regurgitation is not visualized. No aortic stenosis is present.   5. The inferior vena cava is normal in size with greater than 50% respiratory variability, suggesting right atrial pressure of 3 mmHg.    PHYSICAL EXAM Physical Exam  Constitutional:  Appears well-developed and well-nourished.  Psych: Affect flat, occasionally somewhat anxious Eyes: No scleral injection HENT: No oropharyngeal obstruction.  MSK: no joint deformities.  Cardiovascular: Normal rate and regular rhythm.  Respiratory: Effort normal, non-labored breathing GI: Soft.  No distension. There is no tenderness.  Skin: Warm dry and intact visible skin  Neuro: awake alert, orientated and interactive. Speech less hypophonia, no significant dysarthria. Following all simple commands. No aphasia, CN intact. BUE deltoid 3-/5 only rise about 45 degree, bicep 4/5 and distal 4/5, RLE 4+/5, LLE 3+/5. Sensation symmetrical. DTR 2+. FTN intact slow on the left.   ASSESSMENT/PLAN Deborah [name] is a 65 y.o. female with history of breast cancer, HTN,  and prior strokes presenting with generalized weakness, dizziness, and pain. Concern for stroke. CT head no acute abnormalities or hemorrhage.  tPA given on 03/20/2020 at 1:47am.  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   MRI showed no stroke in the brain, however MRI C-spine without contrast C4 spinal cord anterior portion faint T2 flair signal.  MRI C-spine with contrast showed possible cord ischemia.  EF 60 to 65%, A1c 6.5, LDL 156, UDS negative. Creatinine 0.70.  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 ASPECTS 10    CTA head & neck: Occlusion of the left vertebral artery at its origin with reconstitution of the distal left V4 segment. Approximately 50% stenosis of the proximal right ICA secondary to low density atherosclerosis.  MRI C-spine wo - Abnormal signal in the ventral cervical spinal cord centered at the lower C4 level  MRI C-spine w contrast: faint contrast enhancement associated with the area of increased T2 signal without evidence of nodularity or mass effect. Findings are suggestive of cord ischemia.   MRI brain: Severe intracranial small vessel disease   CT head: 24 hour post TPA: Chronic small vessel disease without acute intracranial abnormality.  2D Echo EF 60-65%  LDL 156  HgbA1c 6.5  VTE prophylaxis - lovenox 40mg  daily  Heart healthy diet  No antithrombotic prior to admission,  aspirin 81 mg daily and clopidogrel 75 mg daily  DAPT for 3 weeks and then ASA alone.   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 Acute  s/p tPA  Do not feel related to cervical cord ischemia  Hx of stroke  Admitted on 03/03/2018 ischemic stroke. At that time she developed sudden onset of right-sided numbness and weakness with near syncopal episode. Patient was briefly aphasic and confused with slurring speech. CTA  head/neck (03/03/2018) Right ICA origin and bulb resulting in up to 50% stenosis. Superimposed bilateral ICA siphon calcified plaque with mild Left and up to moderate Right ICA siphon stenoses. CT Perfusion (03/03/2018) detect no core infarct or penumbra. MRI Brain (03/03/2018) 10 mm focus of acute/early subacute infarction within the left lateral thalamus and posterior limb of internal capsule. HgbA1C 6.8, LDL 151. On Aspirin 81 and Plavix for 1 month then Aspirin alone. She received home health PT/OT/ST following 02/2018 discharge   Hypertension  Home meds:  Metoprolol 25mg  daily and hygroton 25  Stable  Resume home meds . Long-term BP goal normotensive  Hyperlipidemia  Home meds:  Zetia 10mg  daily, resumed in hospital  LDL 156, goal < 70  Add lipitor 40  Continue Zetia and statin at discharge  Borderline Diabetes   Home meds: Metformin 500mg  daily  HgbA1c 6.5, goal < 7.0  CBGs  SSI  PCP follow up  Other Stroke Risk Factors  Obesity, Body mass index is 37.03 kg/m., BMI >/= 30 associated with increased stroke risk, recommend weight loss, diet and exercise as appropriate   Hx of stroke (03/03/2018) Left lateral thalamus and posterior internal capsule Stroke  Other Active Problems  GERD  Left breast cancer - she felt her left axillary lymph node enlarged than before, recommend close oncology follow-up after rehab  Hospital day # 3  Rosalin Hawking, MD PhD Stroke Neurology 03/23/2020 8:02 PM   To contact Stroke Continuity provider, please refer to [email or web address] After hours, contact General Neurology

## 2020-03-23 NOTE — Progress Notes (Signed)
Inpatient Rehab Admissions Coordinator:   Spoke to pt at bedside to let her know that [facility] CIR is out of network with her insurance.  She is agreeable to pursue other IR facilities that are in network.  Will sign off for CIR at this time.   Shann Medal, PT, DPT Admissions Coordinator [phone] 03/23/20  11:09 AM

## 2020-03-24 LAB — BASIC METABOLIC PANEL
Anion gap: 11 (ref 5–15)
BUN: 18 mg/dL (ref 8–23)
CO2: 27 mmol/L (ref 22–32)
Calcium: 9.5 mg/dL (ref 8.9–10.3)
Chloride: 97 mmol/L — ABNORMAL LOW (ref 98–111)
Creatinine, Ser: 0.79 mg/dL (ref 0.44–1.00)
GFR, Estimated: >60 mL/min (ref >60)
Glucose, Bld: 140 mg/dL — ABNORMAL HIGH (ref 70–99)
Potassium: 3.4 mmol/L — ABNORMAL LOW (ref 3.5–5.1)
Sodium: 135 mmol/L (ref 135–145)

## 2020-03-24 LAB — CBC
HCT: 38.1 % (ref 36.0–46.0)
Hemoglobin: 12.2 g/dL (ref 12.0–15.0)
MCH: 27.5 pg (ref 26.0–34.0)
MCHC: 32 g/dL (ref 30.0–36.0)
MCV: 86 fL (ref 80.0–100.0)
Platelets: 229 10*3/uL (ref 150–400)
RBC: 4.43 MIL/uL (ref 3.87–5.11)
RDW: 15.9 % — ABNORMAL HIGH (ref 11.5–15.5)
WBC: 9.3 10*3/uL (ref 4.0–10.5)
nRBC: 0 % (ref 0.0–0.2)

## 2020-03-24 MED ORDER — MELATONIN 3 MG PO TABS
3.0000 mg | ORAL_TABLET | Freq: Every evening | ORAL | Status: DC | PRN
  Administered 2020-03-24: 3 mg via ORAL
  Filled 2020-03-24: qty 1

## 2020-03-24 MED ORDER — MELATONIN 3 MG PO TABS: 3.0000 mg | ORAL_TABLET | Freq: Every day | ORAL | Status: DC

## 2020-03-24 NOTE — Progress Notes (Signed)
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  She reports she was in her usual state of health when she began to have left-sided neck pain while sitting and watching TV.  This was then associated with dizziness, pain all over her body and generalized weakness.  L hemiparesis worse than R hemiparesis, TPA administered. Pt with questionable spinal cord ischemia, MRI showing C4 and T2 signal abnormalities.    PT Comments    The pt was in bed upon arrival of PT, eager to mobilize despite reports of fatigue this afternoon. The pt was able to complete session with focus on improved control of power up and eccentric lower in sit-stand transfers. The pt was able to complete multiple reps with minG to minA with max verbal cues for technique and controlled lower. The pt was also able to progress functional ambulation distance, but continues to require UE support and increased time with step-to pattern due to deficits in strength and stability, continue to recommend CIR level therapies to facilitate progress towards pt goal of returning to work.    Follow Up Recommendations  CIR     Equipment Recommendations  Rolling walker with 5" wheels;3in1 (PT)    Recommendations for Other Services       Precautions / Restrictions Precautions Precautions: Fall Precaution Comments: Monitor BP Restrictions Weight Bearing Restrictions: No    Mobility  Bed Mobility Overal bed mobility: Needs Assistance Bed Mobility: Supine to Sit Rolling: Min guard   Supine to sit: Min assist;HOB elevated     General bed mobility comments: modA to elevate trunk from elevated HOB. increased time and effort    Transfers Overall transfer level: Needs assistance Equipment used: Rolling  walker (2 wheeled) Transfers: Sit to/from Stand Sit to Stand: Min assist Stand pivot transfers: Min assist       General transfer comment: minA with significantly increased time and cues for hand positioning. minA to steady and manage RW. pt with poor eccentric lower despite cues, able to improve with max cues and attention  Ambulation/Gait Ambulation/Gait assistance: Min assist Gait Distance (Feet): 8 Feet (+ 36ft) Assistive device: Rolling walker (2 wheeled) Gait Pattern/deviations: Shuffle;Drifts right/left;Trunk flexed;Step-to pattern;Decreased step length - right;Decreased dorsiflexion - left;Decreased dorsiflexion - right;Decreased step length - left Gait velocity: decr   General Gait Details: minA to steady, assist with RW management. miNA to support with fatigue. continuous max cues for upright posture, foot clearance, hip flexion and control of lower with each step      Modified Rankin (Stroke Patients Only) Modified Rankin (Stroke Patients Only) Pre-Morbid Rankin Score: No symptoms Modified Rankin: Moderately severe disability     Balance Overall balance assessment: Needs assistance Sitting-balance support: No upper extremity supported;Feet supported Sitting balance-Leahy Scale: Fair     Standing balance support: Bilateral upper extremity supported;During functional activity Standing balance-Leahy Scale: Poor Standing balance comment: reliant on UE support                            Cognition Arousal/Alertness: Lethargic Behavior During Therapy: WFL for tasks assessed/performed Overall Cognitive Status: Within Functional Limits for tasks assessed  General Comments: pt "sleepy" and slightly lethargic today, able to participate in session and motivated but endorses fatigue throughout session      Exercises General Exercises - Lower Extremity Ankle Circles/Pumps: AROM;Both;10 reps;Supine Quad Sets:  AROM;Both;10 reps;Supine Long Arc Quad: AROM;Both;10 reps;Seated Heel Slides: AROM;Both;10 reps;Supine Hip Flexion/Marching: AROM;Both;10 reps;Standing Other Exercises Other Exercises: sit-stand from low chair with armrests x 5. cues for each transition. max cues for eccentric lower    General Comments General comments (skin integrity, edema, etc.): VSS through session      Pertinent Vitals/Pain Pain Assessment: No/denies pain Pain Intervention(s): Monitored during session           PT Goals (current goals can now be found in the care plan section) Acute Rehab PT Goals Patient Stated Goal: I want to move and get back to work PT Goal Formulation: With patient Time For Goal Achievement: 04/04/20 Potential to Achieve Goals: Good Progress towards PT goals: Progressing toward goals    Frequency    Min 3X/week      PT Plan Current plan remains appropriate       AM-PAC PT "6 Clicks" Mobility   Outcome Measure  Help needed turning from your back to your side while in a flat bed without using bedrails?: A Lot Help needed moving from lying on your back to sitting on the side of a flat bed without using bedrails?: A Lot Help needed moving to and from a bed to a chair (including a wheelchair)?: A Lot Help needed standing up from a chair using your arms (e.g., wheelchair or bedside chair)?: A Little Help needed to walk in hospital room?: A Little Help needed climbing 3-5 steps with a railing? : A Lot 6 Click Score: 14    End of Session Equipment Utilized During Treatment: Gait belt Activity Tolerance: Patient limited by fatigue Patient left: with call bell/phone within reach;in bed;with bed alarm set Nurse Communication: Mobility status PT Visit Diagnosis: Unsteadiness on feet (R26.81);Muscle weakness (generalized) (M62.81)     Time: 4827-0786 PT Time Calculation (min) (ACUTE ONLY): 32 min  Charges:  $Gait Training: 8-22 mins $Therapeutic Exercise: 8-22 mins                      Karma Ganja, PT, DPT   Acute Rehabilitation Department Pager #: [phone]   Otho Bellows 03/24/2020, 3:02 PM

## 2020-03-24 NOTE — Progress Notes (Addendum)
STROKE TEAM PROGRESS NOTE   INTERVAL HISTORY Pt sitting in chair with family bedside on conference call. Lower extremity weakness improved, upper extremities still limited.  Patient cannot be admitted CIR here due to insurance not in network.  Will have to admit to [facility] likely next week.  Vitals:   03/23/20 2346 03/24/20 0402 03/24/20 0718 03/24/20 1138  BP: 138/82 113/65 (!) 143/77 136/85  Pulse: 63 (!) 54 (!) 54 (!) 55  Resp: 18 17 18 18   Temp: 98.1 F (36.7 C) 98 F (36.7 C) (!) 97.5 F (36.4 C) 97.6 F (36.4 C)  TempSrc: Oral Oral Oral Oral  SpO2: 100% 100% 100% 100%  Weight:      Height:       CBC:  Recent Labs  Lab 03/20/20 0102 03/20/20 0114 03/23/20 0346 03/24/20 0247  WBC 6.8   < > 8.5 9.3  NEUTROABS 2.9  --   --   --   HGB 11.7*   < > 12.2 12.2  HCT 37.6   < > 36.9 38.1  MCV 87.6   < > 85.2 86.0  PLT 201   < > 218 229   < > = values in this interval not displayed.   Basic Metabolic Panel:  Recent Labs  Lab 03/21/20 0535 03/22/20 0323 03/23/20 0346 03/24/20 0247  NA 140   < > 138 135  K 4.3   < > 3.5 3.4*  CL 107   < > 100 97*  CO2 25   < > 26 27  GLUCOSE 119*   < > 135* 140*  BUN 7*   < > 14 18  CREATININE 0.96   < > 1.03* 0.79  CALCIUM 8.8*   < > 9.3 9.5  MG 2.0  --   --   --    < > = values in this interval not displayed.   Lipid Panel:  Recent Labs  Lab 03/20/20 0507  CHOL 222*  TRIG 72  HDL 52  CHOLHDL 4.3  VLDL 14  LDLCALC 156*   HgbA1c:  Recent Labs  Lab 03/20/20 0507  HGBA1C 6.5*   Urine Drug Screen:  Recent Labs  Lab 03/20/20 0102  LABOPIA NONE DETECTED  COCAINSCRNUR NONE DETECTED  LABBENZ NONE DETECTED  AMPHETMU NONE DETECTED  THCU NONE DETECTED  LABBARB NONE DETECTED    Alcohol Level  Recent Labs  Lab 03/20/20 0102  ETH <10    IMAGING past 24 hours  CT HEAD WO CONTRAST Result Date: 03/21/2020 IMPRESSION:  Chronic small vessel disease without acute intracranial abnormality.   MR CERVICAL SPINE W  CONTRAST Result Date: 03/20/2020  IMPRESSION:  1. There is faint contrast enhancement associated with the area of increased T2 signal without evidence of nodularity or mass effect. Findings are suggestive of cord ischemia.  2. Contrast enhancement within the visualized left vertebral artery likely represents enhancing thrombus.   ECHOCARDIOGRAM COMPLETE Result Date: 03/20/2020 IMPRESSIONS   1. Left ventricular ejection fraction, by estimation, is 60 to 65%. The left ventricle has normal function. The left ventricle has no regional wall motion abnormalities. There is mild concentric left ventricular hypertrophy. Left ventricular diastolic parameters are indeterminate.  2. Right ventricular systolic function is normal. The right ventricular size is normal.   3. The mitral valve is normal in structure. Mild mitral valve regurgitation. No evidence of mitral stenosis.   4. The aortic valve is grossly normal. There is mild calcification of the aortic valve. There is mild  thickening of the aortic valve. Aortic valve regurgitation is not visualized. No aortic stenosis is present.   5. The inferior vena cava is normal in size with greater than 50% respiratory variability, suggesting right atrial pressure of 3 mmHg.    PHYSICAL EXAM Physical Exam  Constitutional: Appears well-developed and well-nourished.  Psych: Affect flat, occasionally somewhat anxious Eyes: No scleral injection HENT: No oropharyngeal obstruction.  MSK: no joint deformities.  Cardiovascular: Normal rate and regular rhythm.  Respiratory: Effort normal, non-labored breathing GI: Soft.  No distension. There is no tenderness.  Skin: Warm dry and intact visible skin  Neuro: awake alert, orientated and interactive. Speech normal, no significant dysarthria. Follows all commands. No aphasia, CN intact. BUE deltoid 3-/5 only rise about 45 degree, bicep 4/5 and distal 4/5, RLE 4+/5, LLE 3+/5. Sensation symmetrical. DTR 2+. FTN intact slow  on the left.   ASSESSMENT/PLAN [name] is a 65 y.o. female with history of breast cancer, HTN, and prior strokes presenting with generalized weakness, dizziness, and pain. Concern for stroke. CT head no acute abnormalities or hemorrhage.  tPA given on 03/20/2020 at 1:47am.  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   MRI showed no stroke in the brain, however MRI C-spine without contrast C4 spinal cord anterior portion faint T2 flair signal.  MRI C-spine with contrast showed possible cord ischemia.  EF 60 to 65%, A1c 6.5, LDL 156, UDS negative. Creatinine 0.70.  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 ASPECTS 10    CTA head & neck: Occlusion of the left vertebral artery at its origin with reconstitution of the distal left V4 segment. Approximately 50% stenosis of the proximal right ICA secondary to low density atherosclerosis.  MRI C-spine wo - Abnormal signal in the ventral cervical spinal cord centered at the lower C4 level  MRI C-spine w contrast: faint contrast enhancement associated with the area of increased T2 signal without evidence of nodularity or mass effect. Findings are suggestive of cord ischemia.   MRI brain: Severe intracranial small vessel disease   CT head: 24 hour post TPA: Chronic small vessel disease without acute intracranial abnormality.  2D Echo EF 60-65%  LDL 156  HgbA1c 6.5  VTE prophylaxis - lovenox 40mg  daily  Heart healthy diet  No antithrombotic prior to admission,  aspirin 81 mg daily and clopidogrel 75 mg daily  DAPT for 3 weeks and then ASA alone.   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 Acute  s/p tPA  Do not feel related to cervical cord ischemia  Hx of stroke  Admitted on 03/03/2018 ischemic stroke. At that time she developed sudden onset of right-sided numbness and  weakness with near syncopal episode. Patient was briefly aphasic and confused with slurring speech. CTA head/neck (03/03/2018) Right ICA origin and bulb resulting in up to 50% stenosis. Superimposed bilateral ICA siphon calcified plaque with mild Left and up to moderate Right ICA siphon stenoses. CT Perfusion (03/03/2018) detect no core infarct or penumbra. MRI Brain (03/03/2018) 10 mm focus of acute/early subacute infarction within the left lateral thalamus and posterior limb of internal capsule. HgbA1C 6.8, LDL 151. On Aspirin 81 and Plavix for 1 month then Aspirin alone. She received home health PT/OT/ST following 02/2018 discharge   Hypertension  Home meds:  Metoprolol 25mg  daily and hygroton 25  Stable  Resume home meds . Long-term BP goal normotensive  Hyperlipidemia  Home meds:  Zetia 10mg  daily, resumed in hospital  LDL 156, goal < 70  Add lipitor 40  Continue Zetia and statin at discharge  Borderline Diabetes   Home meds: Metformin 500mg  daily  HgbA1c 6.5, goal < 7.0  CBGs  SSI  PCP follow up  Other Stroke Risk Factors  Obesity, Body mass index is 37.03 kg/m., BMI >/= 30 associated with increased stroke risk, recommend weight loss, diet and exercise as appropriate   Hx of stroke (03/03/2018) Left lateral thalamus and posterior internal capsule Stroke  Other Active Problems  GERD  Left breast cancer - she felt her left axillary lymph node enlarged than before, recommend close oncology follow-up after rehab  Hospital day # 4   Lynnae Sandhoff, MD Page: [phone] 03/24/2020 11:51 AM   To contact Stroke Continuity provider, please refer to [email or web address] After hours, contact General Neurology

## 2020-03-25 DIAGNOSIS — I6509 Occlusion and stenosis of unspecified vertebral artery: Secondary | ICD-10-CM

## 2020-03-25 DIAGNOSIS — E785 Hyperlipidemia, unspecified: Secondary | ICD-10-CM

## 2020-03-25 LAB — CBC
HCT: 38.8 % (ref 36.0–46.0)
Hemoglobin: 12.5 g/dL (ref 12.0–15.0)
MCH: 27.8 pg (ref 26.0–34.0)
MCHC: 32.2 g/dL (ref 30.0–36.0)
MCV: 86.4 fL (ref 80.0–100.0)
Platelets: 237 10*3/uL (ref 150–400)
RBC: 4.49 MIL/uL (ref 3.87–5.11)
RDW: 15.8 % — ABNORMAL HIGH (ref 11.5–15.5)
WBC: 9.2 10*3/uL (ref 4.0–10.5)
nRBC: 0 % (ref 0.0–0.2)

## 2020-03-25 LAB — BASIC METABOLIC PANEL
Anion gap: 11 (ref 5–15)
BUN: 22 mg/dL (ref 8–23)
CO2: 29 mmol/L (ref 22–32)
Calcium: 9.6 mg/dL (ref 8.9–10.3)
Chloride: 97 mmol/L — ABNORMAL LOW (ref 98–111)
Creatinine, Ser: 0.99 mg/dL (ref 0.44–1.00)
GFR, Estimated: >60 mL/min (ref >60)
Glucose, Bld: 132 mg/dL — ABNORMAL HIGH (ref 70–99)
Potassium: 3.2 mmol/L — ABNORMAL LOW (ref 3.5–5.1)
Sodium: 137 mmol/L (ref 135–145)

## 2020-03-25 MED ORDER — BISACODYL 10 MG RE SUPP
10.0000 mg | Freq: Once | RECTAL | Status: AC
  Administered 2020-03-25: 10 mg via RECTAL
  Filled 2020-03-25: qty 1

## 2020-03-25 MED ORDER — TRAMADOL HCL 50 MG PO TABS: 50.0000 mg | ORAL_TABLET | Freq: Every day | ORAL | Status: DC | PRN

## 2020-03-25 MED ORDER — BISACODYL 10 MG RE SUPP: 10.0000 mg | Freq: Every day | RECTAL | Status: DC | PRN

## 2020-03-25 MED ORDER — FENTANYL CITRATE (PF) 100 MCG/2ML IJ SOLN
50.0000 ug | Freq: Once | INTRAMUSCULAR | Status: AC
Start: 2020-03-25 — End: 2020-03-25
  Administered 2020-03-25: 50 ug via INTRAVENOUS
  Filled 2020-03-25: qty 2

## 2020-03-25 NOTE — Progress Notes (Addendum)
STROKE TEAM PROGRESS NOTE   INTERVAL HISTORY Afebrile. HR 50s asymptomatic during the night, now 60s. BP 948N -462V systolic. Reports she sat up in chair yesterday for several hours and worked with therapy.  Tolerating diet. No BM x 8 days. She feels abdomen is bloating. Amenable to suppository and possible enema.  C/o bilat leg pain since stroke, worst in the morning. Worse today which she attributes to intense therapy session yesterday. Acetaminophen has not been helping.   Vitals:   03/25/20 0000 03/25/20 0022 03/25/20 0435 03/25/20 0745  BP: 129/70 129/70 112/85 98/75  Pulse: 60 60 (!) 55 65  Resp: 18 18 20 19   Temp: 98.5 F (36.9 C) 98.5 F (36.9 C) 98.7 F (37.1 C) (!) 97.5 F (36.4 C)  TempSrc: Oral Oral Oral Oral  SpO2: 97% 97% 100% 100%  Weight:      Height:       CBC:  Recent Labs  Lab 03/20/20 0102 03/20/20 0114 03/24/20 0247 03/25/20 0213  WBC 6.8   < > 9.3 9.2  NEUTROABS 2.9  --   --   --   HGB 11.7*   < > 12.2 12.5  HCT 37.6   < > 38.1 38.8  MCV 87.6   < > 86.0 86.4  PLT 201   < > 229 237   < > = values in this interval not displayed.   Basic Metabolic Panel:  Recent Labs  Lab 03/21/20 0535 03/22/20 0323 03/24/20 0247 03/25/20 0213  NA 140   < > 135 137  K 4.3   < > 3.4* 3.2*  CL 107   < > 97* 97*  CO2 25   < > 27 29  GLUCOSE 119*   < > 140* 132*  BUN 7*   < > 18 22  CREATININE 0.96   < > 0.79 0.99  CALCIUM 8.8*   < > 9.5 9.6  MG 2.0  --   --   --    < > = values in this interval not displayed.   Lipid Panel:  Recent Labs  Lab 03/20/20 0507  CHOL 222*  TRIG 72  HDL 52  CHOLHDL 4.3  VLDL 14  LDLCALC 156*   HgbA1c:  Recent Labs  Lab 03/20/20 0507  HGBA1C 6.5*   Urine Drug Screen:  Recent Labs  Lab 03/20/20 0102  LABOPIA NONE DETECTED  COCAINSCRNUR NONE DETECTED  LABBENZ NONE DETECTED  AMPHETMU NONE DETECTED  THCU NONE DETECTED  LABBARB NONE DETECTED    Alcohol Level  Recent Labs  Lab 03/20/20 0102  ETH <10     IMAGING past 24 hours  CT HEAD WO CONTRAST Result Date: 03/21/2020 IMPRESSION:  Chronic small vessel disease without acute intracranial abnormality.   MR CERVICAL SPINE W CONTRAST Result Date: 03/20/2020  IMPRESSION:  1. There is faint contrast enhancement associated with the area of increased T2 signal without evidence of nodularity or mass effect. Findings are suggestive of cord ischemia.  2. Contrast enhancement within the visualized left vertebral artery likely represents enhancing thrombus.   ECHOCARDIOGRAM COMPLETE Result Date: 03/20/2020 IMPRESSIONS   1. Left ventricular ejection fraction, by estimation, is 60 to 65%. The left ventricle has normal function. The left ventricle has no regional wall motion abnormalities. There is mild concentric left ventricular hypertrophy. Left ventricular diastolic parameters are indeterminate.  2. Right ventricular systolic function is normal. The right ventricular size is normal.   3. The mitral valve is normal in structure. Mild mitral  valve regurgitation. No evidence of mitral stenosis.   4. The aortic valve is grossly normal. There is mild calcification of the aortic valve. There is mild thickening of the aortic valve. Aortic valve regurgitation is not visualized. No aortic stenosis is present.   5. The inferior vena cava is normal in size with greater than 50% respiratory variability, suggesting right atrial pressure of 3 mmHg.    PHYSICAL EXAM Physical Exam  Constitutional: Appears well-developed and well-nourished.  Psych: Affect flat, occasionally somewhat anxious Eyes: No scleral injection HENT: No oropharyngeal obstruction.  MSK: no joint deformities.  Cardiovascular: Normal rate and regular rhythm.  Respiratory: Effort normal, non-labored breathing GI: Soft.  No distension. There is no tenderness.  Skin: Warm dry and intact visible skin  Neuro: awake alert, orientated and interactive. Speech normal, no significant dysarthria.  Follows all commands. No aphasia, CN intact. BUE deltoid 3-/5 only rise about 45 degree, bicep 4/5 and distal 4/5, RLE 4+/5, LLE 3+/5. Sensation symmetrical. No tenderness warmth, erythema or edema bilat lower extremities.  DTR 2+. FTN intact slow on the left.   ASSESSMENT/PLAN [name] is a 65 y.o. female with history of breast cancer, HTN, and prior strokes presenting with generalized weakness, dizziness, and pain. Concern for stroke. CT head no acute abnormalities or hemorrhage.  tPA given on 03/20/2020 at 1:47am.  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   MRI showed no stroke in the brain, however MRI C-spine without contrast C4 spinal cord anterior portion faint T2 flair signal.  MRI C-spine with contrast showed possible cord ischemia.  EF 60 to 65%, A1c 6.5, LDL 156, UDS negative.   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 ASPECTS 10    CTA head & neck: Occlusion of the left vertebral artery at its origin with reconstitution of the distal left V4 segment. Approximately 50% stenosis of the proximal right ICA secondary to low density atherosclerosis.  MRI C-spine wo - Abnormal signal in the ventral cervical spinal cord centered at the lower C4 level  MRI C-spine w contrast: faint contrast enhancement associated with the area of increased T2 signal without evidence of nodularity or mass effect. Findings are suggestive of cord ischemia.   MRI brain: Severe intracranial small vessel disease   CT head: 24 hour post TPA: Chronic small vessel disease without acute intracranial abnormality.  2D Echo EF 60-65%  LDL 156  HgbA1c 6.5  VTE prophylaxis - lovenox 40mg  daily  Heart healthy diet  No antithrombotic prior to admission,  aspirin 81 mg daily and clopidogrel 75 mg daily  DAPT for 3 weeks and then ASA alone.   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 Acute  s/p tPA  Do not feel related to cervical cord ischemia  Hx of stroke  Admitted on 03/03/2018 ischemic stroke. At that time she developed sudden onset of right-sided numbness and weakness with near syncopal episode. Patient was briefly aphasic and confused with slurring speech. CTA head/neck (03/03/2018) Right ICA origin and bulb resulting in up to 50% stenosis. Superimposed bilateral ICA siphon calcified plaque with mild Left and up to moderate Right ICA siphon stenoses. CT Perfusion (03/03/2018) detect no core infarct or penumbra. MRI Brain (03/03/2018) 10 mm focus of acute/early subacute infarction within the left lateral thalamus and posterior limb of internal capsule. HgbA1C 6.8, LDL  151. On Aspirin 81 and Plavix for 1 month then Aspirin alone. She received home health PT/OT/ST following 02/2018 discharge   Hypertension  Home meds:  Metoprolol 25mg  daily and hygroton 25  Stable  Resume home meds . Long-term BP goal normotensive  Hyperlipidemia  Home meds:  Zetia 10mg  daily, resumed in hospital  LDL 156, goal < 70  Add lipitor 40  Continue Zetia and statin at discharge  Borderline Diabetes   Home meds: Metformin 500mg  daily  HgbA1c 6.5, goal < 7.0  CBGs  SSI  PCP follow up  Other Stroke Risk Factors  Obesity, Body mass index is 37.03 kg/m., BMI >/= 30 associated with increased stroke risk, recommend weight loss, diet and exercise as appropriate   Hx of stroke (03/03/2018) Left lateral thalamus and posterior internal capsule Stroke  Other Active Problems  GERD  Left breast cancer - she felt her left axillary lymph node enlarged than before, recommend close oncology follow-up after rehab  Bilat LE pain: tramadol 50mg  daily prn  Hospital day # 5  Electronically signed by:  Lynnae Sandhoff, MD Page: [phone] 03/26/2020, 8:24 PM    To contact Stroke Continuity provider, please refer to [email or web address] After  hours, contact General Neurology

## 2020-03-26 NOTE — Progress Notes (Addendum)
PT Cancellation Note  Patient Details Name: Deborah [name] MRN: [id number] DOB: 02-Jul-1955   Cancelled Treatment:    Reason Eval/Treat Not Completed: Other (comment) Attempted to see pt in AM however she reports not feeling well and on the phone with short term disability. Will follow.  Attempted to see in PM however pt just got off [facility] and reports feeling "crummy" and tired, declining mobility. Assisted pt back to bed. Will follow.   Marguarite Arbour A [name] 03/26/2020, 1:20 PM Marisa Severin, PT, DPT Acute Rehabilitation Services Pager [phone] Office [phone]

## 2020-03-26 NOTE — Progress Notes (Signed)
STROKE TEAM PROGRESS NOTE   INTERVAL HISTORY  Pt sitting in chair. No family present.  Neurological exam stable without any changes.    Afebrile, Vital Signs stable.   Lower extremity weakness continues to improve slowly, upper extremities still limited.  Patient cannot be admitted CIR here due to insurance not in network.  Will have to admit to [name] likely I next few days as per d/w rehab coordinator  Vitals:   03/25/20 2340 03/26/20 0717 03/26/20 1114 03/26/20 1536  BP: (!) 152/79 (!) 141/90 138/84 124/84  Pulse: 61 66 (!) 52 (!) 53  Resp: 17 18 16 18   Temp: 98.8 F (37.1 C) 98.2 F (36.8 C) 98.1 F (36.7 C) 98 F (36.7 C)  TempSrc: Oral Oral Oral Oral  SpO2: 98% 100% 100% 100%  Weight:      Height:       CBC:  Recent Labs  Lab 03/20/20 0102 03/20/20 0114 03/24/20 0247 03/25/20 0213  WBC 6.8   < > 9.3 9.2  NEUTROABS 2.9  --   --   --   HGB 11.7*   < > 12.2 12.5  HCT 37.6   < > 38.1 38.8  MCV 87.6   < > 86.0 86.4  PLT 201   < > 229 237   < > = values in this interval not displayed.   Basic Metabolic Panel:  Recent Labs  Lab 03/21/20 0535 03/22/20 0323 03/24/20 0247 03/25/20 0213  NA 140   < > 135 137  K 4.3   < > 3.4* 3.2*  CL 107   < > 97* 97*  CO2 25   < > 27 29  GLUCOSE 119*   < > 140* 132*  BUN 7*   < > 18 22  CREATININE 0.96   < > 0.79 0.99  CALCIUM 8.8*   < > 9.5 9.6  MG 2.0  --   --   --    < > = values in this interval not displayed.   Lipid Panel:  Recent Labs  Lab 03/20/20 0507  CHOL 222*  TRIG 72  HDL 52  CHOLHDL 4.3  VLDL 14  LDLCALC 156*   HgbA1c:  Recent Labs  Lab 03/20/20 0507  HGBA1C 6.5*   Urine Drug Screen:  Recent Labs  Lab 03/20/20 0102  LABOPIA NONE DETECTED  COCAINSCRNUR NONE DETECTED  LABBENZ NONE DETECTED  AMPHETMU NONE DETECTED  THCU NONE DETECTED  LABBARB NONE DETECTED    Alcohol Level  Recent Labs  Lab 03/20/20 0102  ETH <10    IMAGING past 24 hours  CT HEAD WO CONTRAST Result Date:  03/21/2020 IMPRESSION:  Chronic small vessel disease without acute intracranial abnormality.   MR CERVICAL SPINE W CONTRAST Result Date: 03/20/2020  IMPRESSION:  1. There is faint contrast enhancement associated with the area of increased T2 signal without evidence of nodularity or mass effect. Findings are suggestive of cord ischemia.  2. Contrast enhancement within the visualized left vertebral artery likely represents enhancing thrombus.   ECHOCARDIOGRAM COMPLETE Result Date: 03/20/2020 IMPRESSIONS   1. Left ventricular ejection fraction, by estimation, is 60 to 65%. The left ventricle has normal function. The left ventricle has no regional wall motion abnormalities. There is mild concentric left ventricular hypertrophy. Left ventricular diastolic parameters are indeterminate.  2. Right ventricular systolic function is normal. The right ventricular size is normal.   3. The mitral valve is normal in structure. Mild mitral valve regurgitation. No evidence  of mitral stenosis.   4. The aortic valve is grossly normal. There is mild calcification of the aortic valve. There is mild thickening of the aortic valve. Aortic valve regurgitation is not visualized. No aortic stenosis is present.   5. The inferior vena cava is normal in size with greater than 50% respiratory variability, suggesting right atrial pressure of 3 mmHg.    PHYSICAL EXAM Physical Exam  Constitutional: Appears well-developed and well-nourished.  Psych: Affect flat, occasionally somewhat anxious Eyes: No scleral injection HENT: No oropharyngeal obstruction.  MSK: no joint deformities.  Cardiovascular: Normal rate and regular rhythm.  Respiratory: Effort normal, non-labored breathing GI: Soft.  No distension. There is no tenderness.  Skin: Warm dry and intact visible skin  Neurological Exam : : awake alert, oriented and interactive. Speech normal, no significant dysarthria. Follows all commands. No aphasia, CN intact. BUE  deltoid 3-/5 only rise about 45 degree, bicep 4/5 and distal 4/5, RLE 4+/5, LLE 3+/5. Sensation symmetrical. DTR 2+. FTN intact slow on the left.   ASSESSMENT/PLAN [name] is a 65 y.o. female with history of breast cancer, HTN, and prior strokes presenting with generalized weakness, dizziness, and pain. Concern for stroke. CT head no acute abnormalities or hemorrhage.  tPA given on 03/20/2020 at 1:47am.  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   MRI showed no stroke in the brain, however MRI C-spine without contrast C4 spinal cord anterior portion faint T2 flair signal.  MRI C-spine with contrast showed possible cord ischemia.  EF 60 to 65%, A1c 6.5, LDL 156, UDS negative. Creatinine 0.70.  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 ASPECTS 10    CTA head & neck: Occlusion of the left vertebral artery at its origin with reconstitution of the distal left V4 segment. Approximately 50% stenosis of the proximal right ICA secondary to low density atherosclerosis.  MRI C-spine wo - Abnormal signal in the ventral cervical spinal cord centered at the lower C4 level  MRI C-spine w contrast: faint contrast enhancement associated with the area of increased T2 signal without evidence of nodularity or mass effect. Findings are suggestive of cord ischemia.   MRI brain: Severe intracranial small vessel disease   CT head: 24 hour post TPA: Chronic small vessel disease without acute intracranial abnormality.  2D Echo EF 60-65%  LDL 156  HgbA1c 6.5  VTE prophylaxis - lovenox 40mg  daily  Heart healthy diet  No antithrombotic prior to admission,  aspirin 81 mg daily and clopidogrel 75 mg daily  DAPT for 3 weeks and then ASA alone.   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 Acute  s/p tPA  Do not feel related to cervical cord  ischemia  Hx of stroke  Admitted on 03/03/2018 ischemic stroke. At that time she developed sudden onset of right-sided numbness and weakness with near syncopal episode. Patient was briefly aphasic and confused with slurring speech. CTA head/neck (03/03/2018) Right ICA origin and bulb resulting in up to 50% stenosis. Superimposed bilateral ICA siphon calcified plaque with mild Left and up to moderate Right ICA siphon stenoses. CT Perfusion (03/03/2018) detect no core infarct or penumbra. MRI Brain (03/03/2018) 10 mm focus of acute/early subacute infarction within the left lateral thalamus and posterior limb of internal capsule. HgbA1C 6.8, LDL 151. On Aspirin 81 and Plavix for 1 month then  Aspirin alone. She received home health PT/OT/ST following 02/2018 discharge   Hypertension  Home meds:  Metoprolol 25mg  daily and hygroton 25  Stable  Resume home meds . Long-term BP goal normotensive  Hyperlipidemia  Home meds:  Zetia 10mg  daily, resumed in hospital  LDL 156, goal < 70  Add lipitor 40  Continue Zetia and statin at discharge  Borderline Diabetes   Home meds: Metformin 500mg  daily  HgbA1c 6.5, goal < 7.0  CBGs  SSI  PCP follow up  Nausea/Vomiting  After receiving fentanyl for pain  Zofran if nausea persists  May be due to GERD as well.  On Protonix 40mg  daily  Other Stroke Risk Factors  Obesity, Body mass index is 37.03 kg/m., BMI >/= 30 associated with increased stroke risk, recommend weight loss, diet and exercise as appropriate   Hx of stroke (03/03/2018) Left lateral thalamus and posterior internal capsule Stroke  Other Active Problems  GERD  Left breast cancer - she felt her left axillary lymph node enlarged than before, recommend close oncology follow-up after rehab  Hospital day # 6 I have personally obtained history,examined this patient, reviewed notes, independently viewed imaging studies, participated in medical decision making and plan of care.ROS  completed by me personally and pertinent positives fully documented  I have made any additions or clarifications directly to the above note. Agree with note above.  Continue ongoing supportive therapy.  Medically stable to be transferred to inpatient rehab in the next few days when bed available.  Discussed with rehab coordinator.  Greater than 50% time during this 25-minute visit was spent in counseling and coordination of care and discussion with care team.  Antony Contras, MD Medical Director [facility] Pager: [phone] 03/26/2020 5:05 PM   To contact Stroke Continuity provider, please refer to [email or web address] After hours, contact General Neurology

## 2020-03-27 LAB — BASIC METABOLIC PANEL
Anion gap: 14 (ref 5–15)
BUN: 17 mg/dL (ref 8–23)
CO2: 29 mmol/L (ref 22–32)
Calcium: 9.7 mg/dL (ref 8.9–10.3)
Chloride: 92 mmol/L — ABNORMAL LOW (ref 98–111)
Creatinine, Ser: 0.88 mg/dL (ref 0.44–1.00)
GFR, Estimated: >60 mL/min (ref >60)
Glucose, Bld: 147 mg/dL — ABNORMAL HIGH (ref 70–99)
Potassium: 3.1 mmol/L — ABNORMAL LOW (ref 3.5–5.1)
Sodium: 135 mmol/L (ref 135–145)

## 2020-03-27 LAB — GLUCOSE, CAPILLARY
Glucose-Capillary: 139 mg/dL — ABNORMAL HIGH (ref 70–99)
Glucose-Capillary: 135 mg/dL — ABNORMAL HIGH (ref 70–99)

## 2020-03-27 LAB — CBC
HCT: 41.9 % (ref 36.0–46.0)
Hemoglobin: 13.5 g/dL (ref 12.0–15.0)
MCH: 27.3 pg (ref 26.0–34.0)
MCHC: 32.2 g/dL (ref 30.0–36.0)
MCV: 84.8 fL (ref 80.0–100.0)
Platelets: 257 10*3/uL (ref 150–400)
RBC: 4.94 MIL/uL (ref 3.87–5.11)
RDW: 15.1 % (ref 11.5–15.5)
WBC: 8.7 10*3/uL (ref 4.0–10.5)
nRBC: 0 % (ref 0.0–0.2)

## 2020-03-27 LAB — SARS CORONAVIRUS 2 (TAT 6-24 HRS): SARS Coronavirus 2: NEGATIVE

## 2020-03-27 LAB — MAGNESIUM: Magnesium: 1.9 mg/dL (ref 1.7–2.4)

## 2020-03-27 LAB — POTASSIUM: Potassium: 3.7 mmol/L (ref 3.5–5.1)

## 2020-03-27 LAB — PHOSPHORUS: Phosphorus: 3.3 mg/dL (ref 2.5–4.6)

## 2020-03-27 MED ORDER — POTASSIUM CHLORIDE 10 MEQ/100ML IV SOLN
10.0000 meq | INTRAVENOUS | Status: AC
  Administered 2020-03-27 (×4): 10 meq via INTRAVENOUS
  Filled 2020-03-27 (×4): qty 100

## 2020-03-27 MED ORDER — POTASSIUM CHLORIDE CRYS ER 20 MEQ PO TBCR
40.0000 meq | EXTENDED_RELEASE_TABLET | Freq: Once | ORAL | Status: AC
  Administered 2020-03-27: 40 meq via ORAL
  Filled 2020-03-27: qty 2

## 2020-03-27 NOTE — Progress Notes (Signed)
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  She reports she was in her usual state of health when she began to have left-sided neck pain while sitting and watching TV.  This was then associated with dizziness, pain all over her body and generalized weakness.  L hemiparesis worse than R hemiparesis, TPA administered. Pt with questionable spinal cord ischemia, MRI showing C4 and T2 signal abnormalities.   OT comments  Pt progressing towards established OT goals and is motivated to participate despite fatigue. Pt just finishing bath and grooming with NT. Pt donning new gown with Min A. Pt performing functional mobility with Min A and RW. Pt moving slowly and cautiously. Pt reporting fatigue at BLEs. Continue to recommend dc to CIR and will continue to follow acutely as admitted.   Follow Up Recommendations  CIR    Equipment Recommendations  Tub/shower seat;3 in 1 bedside commode    Recommendations for Other Services      Precautions / Restrictions Precautions Precautions: Fall       Mobility Bed Mobility Overal bed mobility: Needs Assistance Bed Mobility: Sit to Supine       Sit to supine: Min assist   General bed mobility comments: Min A for assist with BLEs over EOB    Transfers Overall transfer level: Needs assistance Equipment used: Rolling walker (2 wheeled) Transfers: Sit to/from Stand Sit to Stand: Min assist         General transfer comment: Min A to power up into standing and then gain balance.    Balance Overall balance assessment: Needs assistance Sitting-balance support: No upper extremity supported;Feet supported Sitting balance-Leahy Scale: Fair Sitting balance - Comments: decreased trunk control noted in sitting    Standing balance support: Bilateral upper extremity supported;During functional activity Standing balance-Leahy Scale: Poor Standing balance comment: reliant on UE support and external assist                           ADL either performed or assessed with clinical judgement   ADL Overall ADL's : Needs assistance/impaired                 Upper Body Dressing : Minimal assistance;Sitting                   Functional mobility during ADLs: Minimal assistance;Rolling walker General ADL Comments: Pt just finishing up bath and oral care with NT. Pt agreeable to functional mobility prior to returning to bed for rest. Pt requiring Min A for donning new gown.     Vision       Perception     Praxis      Cognition Arousal/Alertness: Awake/alert Behavior During Therapy: WFL for tasks assessed/performed Overall Cognitive Status: Within Functional Limits for tasks assessed                                 General Comments: Requiring increased time throughout        Exercises     Shoulder Instructions       General Comments      Pertinent Vitals/ Pain       Pain Assessment: Faces Faces Pain Scale: Hurts even more Pain Location: neck  and UE's Pain Descriptors / Indicators: Aching;Constant Pain Intervention(s): Monitored during session;Repositioned  Home Living                                          Prior Functioning/Environment              Frequency  Min 2X/week        Progress Toward Goals  OT Goals(current goals can now be found in the care plan section)  Progress towards OT goals: Progressing toward goals  Acute Rehab OT Goals Patient Stated Goal: I want to move and get back to work OT Goal Formulation: With patient Time For Goal Achievement: 04/04/20 Potential to Achieve Goals: Good ADL Goals Pt Will Perform Eating: with set-up Pt Will Perform Grooming: with set-up;sitting Pt Will Perform  Upper Body Bathing: with min guard assist;sitting Pt Will Perform Upper Body Dressing: with min guard assist;sitting Pt Will Transfer to Toilet: with min assist;stand pivot transfer;bedside commode Pt/caregiver will Perform Home Exercise Program: Increased ROM;Increased strength;Both right and left upper extremity;With minimal assist  Plan Discharge plan remains appropriate    Co-evaluation                 AM-PAC OT "6 Clicks" Daily Activity     Outcome Measure   Help from another person eating meals?: A Little Help from another person taking care of personal grooming?: A Lot Help from another person toileting, which includes using toliet, bedpan, or urinal?: A Lot Help from another person bathing (including washing, rinsing, drying)?: A Lot Help from another person to put on and taking off regular upper body clothing?: A Little Help from another person to put on and taking off regular lower body clothing?: A Lot 6 Click Score: 14    End of Session Equipment Utilized During Treatment: Gait belt;Rolling walker  OT Visit Diagnosis: Unsteadiness on feet (R26.81);Muscle weakness (generalized) (M62.81);Hemiplegia and hemiparesis Hemiplegia - Right/Left: Left Hemiplegia - dominant/non-dominant: Non-Dominant Hemiplegia - caused by: Unspecified   Activity Tolerance Patient tolerated treatment well   Patient Left with call bell/phone within reach;in bed;with bed alarm set   Nurse Communication Mobility status        Time: 1572-6203 OT Time Calculation (min): 14 min  Charges: OT General Charges $OT Visit: 1 Visit OT Treatments $Self Care/Home Management : 8-22 mins  [place], OTR/L Acute Rehab Pager: [phone] Office: [place] 03/27/2020, 1:48 PM

## 2020-03-27 NOTE — Progress Notes (Signed)
STROKE TEAM PROGRESS NOTE   INTERVAL HISTORY  Pt sitting in chair. No family present.  Neurological exam stable without any changes.    Afebrile, Vital Signs stable.  Lower extremity weakness continues to improve slowly, upper extremities still limited.  [place] [facility] tomorrow.  Potassium is low at 3.1 being replaced  Vitals:   03/26/20 2339 03/27/20 0340 03/27/20 0739 03/27/20 1132  BP: (!) 151/90 139/83 128/68 132/77  Pulse: 73 63 61 (!) 54  Resp:   18 18  Temp: 98.1 F (36.7 C) 98.2 F (36.8 C) 97.7 F (36.5 C) 98.9 F (37.2 C)  TempSrc: Oral Oral Oral Oral  SpO2: 100% 93% 99% 99%  Weight:      Height:       CBC:  Recent Labs  Lab 03/25/20 0213 03/27/20 0909  WBC 9.2 8.7  HGB 12.5 13.5  HCT 38.8 41.9  MCV 86.4 84.8  PLT 237 710   Basic Metabolic Panel:  Recent Labs  Lab 03/21/20 0535 03/22/20 0323 03/25/20 0213 03/27/20 0909  NA 140   < > 137 135  K 4.3   < > 3.2* 3.1*  CL 107   < > 97* 92*  CO2 25   < > 29 29  GLUCOSE 119*   < > 132* 147*  BUN 7*   < > 22 17  CREATININE 0.96   < > 0.99 0.88  CALCIUM 8.8*   < > 9.6 9.7  MG 2.0  --   --  1.9  PHOS  --   --   --  3.3   < > = values in this interval not displayed.   Lipid Panel:  No results for input(s): CHOL, TRIG, HDL, CHOLHDL, VLDL, LDLCALC in the last 168 hours. HgbA1c:  No results for input(s): HGBA1C in the last 168 hours. Urine Drug Screen:  No results for input(s): LABOPIA, COCAINSCRNUR, LABBENZ, AMPHETMU, THCU, LABBARB in the last 168 hours.  Alcohol Level  No results for input(s): ETH in the last 168 hours.  IMAGING past 24 hours  CT HEAD WO CONTRAST Result Date: 03/21/2020 IMPRESSION:  Chronic small vessel disease without acute intracranial abnormality.   MR CERVICAL SPINE W CONTRAST Result Date: 03/20/2020  IMPRESSION:  1. There is faint contrast enhancement associated with the area of increased T2 signal without evidence of nodularity or mass effect. Findings are suggestive of cord  ischemia.  2. Contrast enhancement within the visualized left vertebral artery likely represents enhancing thrombus.   ECHOCARDIOGRAM COMPLETE Result Date: 03/20/2020 IMPRESSIONS   1. Left ventricular ejection fraction, by estimation, is 60 to 65%. The left ventricle has normal function. The left ventricle has no regional wall motion abnormalities. There is mild concentric left ventricular hypertrophy. Left ventricular diastolic parameters are indeterminate.  2. Right ventricular systolic function is normal. The right ventricular size is normal.   3. The mitral valve is normal in structure. Mild mitral valve regurgitation. No evidence of mitral stenosis.   4. The aortic valve is grossly normal. There is mild calcification of the aortic valve. There is mild thickening of the aortic valve. Aortic valve regurgitation is not visualized. No aortic stenosis is present.   5. The inferior vena cava is normal in size with greater than 50% respiratory variability, suggesting right atrial pressure of 3 mmHg.    PHYSICAL EXAM Physical Exam  Constitutional: Appears well-developed and well-nourished.  Psych: Affect flat, occasionally somewhat anxious Eyes: No scleral injection HENT: No oropharyngeal obstruction.  MSK: no  joint deformities.  Cardiovascular: Normal rate and regular rhythm.  Respiratory: Effort normal, non-labored breathing GI: Soft.  No distension. There is no tenderness.  Skin: Warm dry and intact visible skin  Neurological Exam : : awake alert, oriented and interactive. Speech normal, no significant dysarthria. Follows all commands. No aphasia, CN intact. BUE deltoid 3-/5 only rise about 45 degree, bicep 4/5 and distal 4/5, RLE 4+/5, LLE 3+/5. Sensation symmetrical. DTR 2+. FTN intact slow on the left.   ASSESSMENT/PLAN [name] is a 65 y.o. female with history of breast cancer, HTN, and prior strokes presenting with generalized weakness, dizziness, and pain. Concern for  stroke. CT head no acute abnormalities or hemorrhage.  tPA given on 03/20/2020 at 1:47am.  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   MRI showed no stroke in the brain, however MRI C-spine without contrast C4 spinal cord anterior portion faint T2 flair signal.  MRI C-spine with contrast showed possible cord ischemia.  EF 60 to 65%, A1c 6.5, LDL 156, UDS negative. Creatinine 0.70.  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 ASPECTS 10    CTA head & neck: Occlusion of the left vertebral artery at its origin with reconstitution of the distal left V4 segment. Approximately 50% stenosis of the proximal right ICA secondary to low density atherosclerosis.  MRI C-spine wo - Abnormal signal in the ventral cervical spinal cord centered at the lower C4 level  MRI C-spine w contrast: faint contrast enhancement associated with the area of increased T2 signal without evidence of nodularity or mass effect. Findings are suggestive of cord ischemia.   MRI brain: Severe intracranial small vessel disease   CT head: 24 hour post TPA: Chronic small vessel disease without acute intracranial abnormality.  2D Echo EF 60-65%  LDL 156  HgbA1c 6.5  VTE prophylaxis - lovenox 40mg  daily  Heart healthy diet  No antithrombotic prior to admission,  aspirin 81 mg daily and clopidogrel 75 mg daily  DAPT for 3 weeks and then ASA alone.   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 Acute  s/p tPA  Do not feel related to cervical cord ischemia  Hx of stroke  Admitted on 03/03/2018 ischemic stroke. At that time she developed sudden onset of right-sided numbness and weakness with near syncopal episode. Patient was briefly aphasic and confused with slurring speech. CTA head/neck (03/03/2018) Right ICA origin and bulb resulting in up to 50% stenosis. Superimposed  bilateral ICA siphon calcified plaque with mild Left and up to moderate Right ICA siphon stenoses. CT Perfusion (03/03/2018) detect no core infarct or penumbra. MRI Brain (03/03/2018) 10 mm focus of acute/early subacute infarction within the left lateral thalamus and posterior limb of internal capsule. HgbA1C 6.8, LDL 151. On Aspirin 81 and Plavix for 1 month then Aspirin alone. She received home health PT/OT/ST following 02/2018 discharge   Hypertension  Home meds:  Metoprolol 25mg  daily and hygroton 25  Stable  Resume home meds . Long-term BP goal normotensive  Hyperlipidemia  Home meds:  Zetia 10mg  daily, resumed in hospital  LDL 156, goal < 70  Add lipitor 40  Continue Zetia and statin at discharge  Hypokalemia  K level 3.1  Ordered potassium chloride 66meq IV and 63meq po  Check level in afternoon  Borderline Diabetes   Home meds: Metformin 500mg  daily  HgbA1c 6.5, goal < 7.0  CBGs  SSI  PCP follow up  Nausea/Vomiting  After receiving fentanyl for pain  Zofran if nausea persists  May be due to GERD as well.  On Protonix 40mg  daily  Other Stroke Risk Factors  Obesity, Body mass index is 37.03 kg/m., BMI >/= 30 associated with increased stroke risk, recommend weight loss, diet and exercise as appropriate   Hx of stroke (03/03/2018) Left lateral thalamus and posterior internal capsule Stroke  Other Active Problems  GERD  Left breast cancer - she felt her left axillary lymph node enlarged than before, recommend close oncology follow-up after rehab  Hospital day # 7 Plan replace potassium.  Continue ongoing therapies.  Transfer to inpatient rehab tomorrow.  Discussed with Dr. Anitra Lauth Rehab MD. greater than 50% time during this 25-minute visit was spent in counseling and coordination of care about stroke and discussion about stroke prevention and care team. Antony Contras, MD Medical Director [facility] Pager: [phone] 03/27/2020 3:56  PM   To contact Stroke Continuity provider, please refer to [email or web address] After hours, contact General Neurology

## 2020-03-27 NOTE — Discharge Summary (Incomplete)
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  Recurred 10/10/13, took OTC Benadryl, and was further evaluated at PCP office 10/11/13 for continued swelling of lips and face. She was treated with Kenalog in office and Benadryl and Medrol dosepak as outpatient.   Aleve [naproxen]    Capsaicin-menthol Hives   Ibuprofen Hives   Shellfish Allergy Hives        Medication List     STOP taking these medications    azithromycin 250 MG tablet Commonly known as: ZITHROMAX   HYDROcodone-homatropine 5-1.5 MG/5ML syrup Commonly known as: HYCODAN   LORazepam 1 MG tablet Commonly known as: ATIVAN   ondansetron 4 MG tablet Commonly known as: ZOFRAN       TAKE these medications    acetaminophen 325 MG tablet Commonly known as: TYLENOL Take 2 tablets (650 mg total) by mouth every 4 (four) hours as needed for mild pain (or temp > 37.5 C (99.5 F)).   aspirin 81 MG EC tablet Take 1 tablet (81 mg total) by mouth daily. Swallow whole.   atorvastatin 40 MG tablet Commonly known as: LIPITOR Take 1 tablet (40 mg total) by mouth daily.   bisacodyl 10 MG  suppository Commonly known as: DULCOLAX Place 1 suppository (10 mg total) rectally daily as needed for moderate constipation.   chlorthalidone 25 MG tablet Commonly known as: HYGROTON Take 25 mg by mouth daily.   clopidogrel 75 MG tablet Commonly known as: PLAVIX Take 1 tablet (75 mg total) by mouth daily.   enoxaparin 40 MG/0.4ML injection Commonly known as: LOVENOX Inject 0.4 mLs (40 mg total) into the skin daily.   ezetimibe 10 MG tablet Commonly known as: ZETIA Take 10 mg by mouth daily.   ferrous sulfate 325 (65 FE) MG tablet Take 325 mg by mouth every other day.   gabapentin 600 MG tablet Commonly known as: NEURONTIN Take 0.5 tablets (300 mg total) by mouth 2 (two) times daily.   melatonin 3 MG Tabs tablet Take 1 tablet (3 mg total) by mouth at bedtime as needed.   metFORMIN 500 MG tablet Commonly known as: GLUCOPHAGE Take 500 mg by mouth daily with breakfast.   metoprolol succinate 25 MG 24 hr tablet Commonly known as: TOPROL-XL Take 1 tablet (25 mg total) by mouth daily.   pantoprazole 40 MG tablet Commonly known as: PROTONIX Take 40 mg by mouth daily.   senna-docusate 8.6-50 MG tablet Commonly known as: Senokot-S Take 1 tablet by mouth at bedtime as needed for moderate constipation or mild constipation.        LABORATORY STUDIES CBC    Component Value Date/Time  WBC 8.7 03/27/2020 0909   RBC 4.94 03/27/2020 0909   HGB 13.5 03/27/2020 0909   HCT 41.9 03/27/2020 0909   PLT 257 03/27/2020 0909   MCV 84.8 03/27/2020 0909   MCH 27.3 03/27/2020 0909   MCHC 32.2 03/27/2020 0909   RDW 15.1 03/27/2020 0909   LYMPHSABS 3.0 03/20/2020 0102   MONOABS 0.9 03/20/2020 0102   EOSABS 0.1 03/20/2020 0102   BASOSABS 0.0 03/20/2020 0102   CMP    Component Value Date/Time   NA 135 03/28/2020 0306   K 3.6 03/28/2020 0306   CL 96 (L) 03/28/2020 0306   CO2 27 03/28/2020 0306   GLUCOSE 138 (H) 03/28/2020 0306   BUN 18 03/28/2020 0306   CREATININE 0.92  03/28/2020 0306   CALCIUM 9.5 03/28/2020 0306   PROT 7.4 03/28/2020 0306   ALBUMIN 3.4 (L) 03/28/2020 0306   AST 62 (H) 03/28/2020 0306   ALT 73 (H) 03/28/2020 0306   ALKPHOS 97 03/28/2020 0306   BILITOT 0.9 03/28/2020 0306   GFRNONAA >60 03/28/2020 0306   GFRAA >60 03/16/2019 1523   COAGS Lab Results  Component Value Date   INR 1.0 03/20/2020   Lipid Panel    Component Value Date/Time   CHOL 222 (H) 03/20/2020 0507   TRIG 72 03/20/2020 0507   HDL 52 03/20/2020 0507   CHOLHDL 4.3 03/20/2020 0507   VLDL 14 03/20/2020 0507   LDLCALC 156 (H) 03/20/2020 0507   HgbA1C  Lab Results  Component Value Date   HGBA1C 6.5 (H) 03/20/2020   Urinalysis    Component Value Date/Time   COLORURINE STRAW (A) 03/20/2020 0102   APPEARANCEUR CLEAR 03/20/2020 0102   LABSPEC 1.017 03/20/2020 0102   PHURINE 8.0 03/20/2020 0102   GLUCOSEU NEGATIVE 03/20/2020 0102   HGBUR NEGATIVE 03/20/2020 0102   BILIRUBINUR NEGATIVE 03/20/2020 0102   KETONESUR NEGATIVE 03/20/2020 0102   PROTEINUR NEGATIVE 03/20/2020 0102   UROBILINOGEN 1.0 11/27/2012 2251   NITRITE NEGATIVE 03/20/2020 0102   LEUKOCYTESUR NEGATIVE 03/20/2020 0102   Urine Drug Screen     Component Value Date/Time   LABOPIA NONE DETECTED 03/20/2020 0102   COCAINSCRNUR NONE DETECTED 03/20/2020 0102   LABBENZ NONE DETECTED 03/20/2020 0102   AMPHETMU NONE DETECTED 03/20/2020 0102   THCU NONE DETECTED 03/20/2020 0102   LABBARB NONE DETECTED 03/20/2020 0102    Alcohol Level    Component Value Date/Time   ETH <10 03/20/2020 0102     SIGNIFICANT DIAGNOSTIC STUDIES CT Code Stroke CTA Head W/WO contrast  Result Date: 03/20/2020 CLINICAL DATA:  Generalized weakness EXAM: CT ANGIOGRAPHY HEAD AND NECK TECHNIQUE: Multidetector CT imaging of the head and neck was performed using the standard protocol during bolus administration of intravenous contrast. Multiplanar CT image reconstructions and MIPs were obtained to evaluate the vascular  anatomy. Carotid stenosis measurements (when applicable) are obtained utilizing NASCET criteria, using the distal internal carotid diameter as the denominator. CONTRAST:  59mL OMNIPAQUE IOHEXOL 350 MG/ML SOLN COMPARISON:  None. FINDINGS: CTA NECK FINDINGS SKELETON: There is no bony spinal canal stenosis. No lytic or blastic lesion. OTHER NECK: Normal pharynx, larynx and major salivary glands. No cervical lymphadenopathy. Unremarkable thyroid gland. UPPER CHEST: Bandlike atelectasis in the left upper lobe. AORTIC ARCH: There is no calcific atherosclerosis of the aortic arch. There is no aneurysm, dissection or hemodynamically significant stenosis of the visualized portion of the aorta. Conventional 3 vessel aortic branching pattern. The visualized proximal subclavian arteries are widely patent. RIGHT CAROTID SYSTEM: No dissection, occlusion  or aneurysm. There is low density atherosclerosis extending into the proximal ICA, resulting in 50% stenosis. LEFT CAROTID SYSTEM: No dissection, occlusion or aneurysm. Mild atherosclerotic calcification at the carotid bifurcation without hemodynamically significant stenosis. VERTEBRAL ARTERIES: Codominant configuration. Left vertebral artery is occluded at its origin. Right vertebral artery is normal to the vertebrobasilar confluence. CTA HEAD FINDINGS POSTERIOR CIRCULATION: --Vertebral arteries: There is opacification of the distal left V4 segment due to collateral flow across the vertebrobasilar confluence. --Inferior cerebellar arteries: Right PICA is patent but the left PICA is occluded. --Basilar artery: Normal. --Superior cerebellar arteries: Normal. --Posterior cerebral arteries (PCA): Normal. ANTERIOR CIRCULATION: --Intracranial internal carotid arteries: Normal. --Anterior cerebral arteries (ACA): Normal. Both A1 segments are present. Patent anterior communicating artery (a-comm). --Middle cerebral arteries (MCA): Normal. VENOUS SINUSES: As permitted by contrast timing,  patent. ANATOMIC VARIANTS: None Review of the MIP images confirms the above findings. IMPRESSION: 1. No intracranial arterial occlusion or high-grade stenosis. 2. Occlusion of the left vertebral artery at its origin with reconstitution of the distal left V4 segment due to collateral flow across the vertebrobasilar confluence. 3. Approximately 50% stenosis of the proximal right internal carotid artery secondary to low density atherosclerosis. Critical Value/emergent results were called by telephone at the time of interpretation on 03/20/2020 at 1:30 am to provider [name] , who verbally acknowledged these results. Electronically Signed   By: Ulyses Jarred M.D.   On: 03/20/2020 01:41   CT HEAD WO CONTRAST  Result Date: 03/21/2020 CLINICAL DATA:  Extremity weakness.  Code stroke follow-up. EXAM: CT HEAD WITHOUT CONTRAST TECHNIQUE: Contiguous axial images were obtained from the base of the skull through the vertex without intravenous contrast. COMPARISON:  Head CT 03/20/2020 FINDINGS: Brain: There is no mass, hemorrhage or extra-axial collection. The size and configuration of the ventricles and extra-axial CSF spaces are normal. There is hypoattenuation of the white matter, most commonly indicating chronic small vessel disease. Dense basal ganglia calcification is unchanged. Vascular: Atherosclerotic calcification of the internal carotid arteries at the skull base. No abnormal hyperdensity of the major intracranial arteries or dural venous sinuses. Skull: The visualized skull base, calvarium and extracranial soft tissues are normal. Sinuses/Orbits: No fluid levels or advanced mucosal thickening of the visualized paranasal sinuses. No mastoid or middle ear effusion. The orbits are normal. IMPRESSION: Chronic small vessel disease without acute intracranial abnormality. Electronically Signed   By: Ulyses Jarred M.D.   On: 03/21/2020 02:32   CT Code Stroke CTA Neck W/WO contrast  Result Date:  03/20/2020 CLINICAL DATA:  Generalized weakness EXAM: CT ANGIOGRAPHY HEAD AND NECK TECHNIQUE: Multidetector CT imaging of the head and neck was performed using the standard protocol during bolus administration of intravenous contrast. Multiplanar CT image reconstructions and MIPs were obtained to evaluate the vascular anatomy. Carotid stenosis measurements (when applicable) are obtained utilizing NASCET criteria, using the distal internal carotid diameter as the denominator. CONTRAST:  36mL OMNIPAQUE IOHEXOL 350 MG/ML SOLN COMPARISON:  None. FINDINGS: CTA NECK FINDINGS SKELETON: There is no bony spinal canal stenosis. No lytic or blastic lesion. OTHER NECK: Normal pharynx, larynx and major salivary glands. No cervical lymphadenopathy. Unremarkable thyroid gland. UPPER CHEST: Bandlike atelectasis in the left upper lobe. AORTIC ARCH: There is no calcific atherosclerosis of the aortic arch. There is no aneurysm, dissection or hemodynamically significant stenosis of the visualized portion of the aorta. Conventional 3 vessel aortic branching pattern. The visualized proximal subclavian arteries are widely patent. RIGHT CAROTID SYSTEM: No dissection, occlusion or aneurysm. There is low density  atherosclerosis extending into the proximal ICA, resulting in 50% stenosis. LEFT CAROTID SYSTEM: No dissection, occlusion or aneurysm. Mild atherosclerotic calcification at the carotid bifurcation without hemodynamically significant stenosis. VERTEBRAL ARTERIES: Codominant configuration. Left vertebral artery is occluded at its origin. Right vertebral artery is normal to the vertebrobasilar confluence. CTA HEAD FINDINGS POSTERIOR CIRCULATION: --Vertebral arteries: There is opacification of the distal left V4 segment due to collateral flow across the vertebrobasilar confluence. --Inferior cerebellar arteries: Right PICA is patent but the left PICA is occluded. --Basilar artery: Normal. --Superior cerebellar arteries: Normal.  --Posterior cerebral arteries (PCA): Normal. ANTERIOR CIRCULATION: --Intracranial internal carotid arteries: Normal. --Anterior cerebral arteries (ACA): Normal. Both A1 segments are present. Patent anterior communicating artery (a-comm). --Middle cerebral arteries (MCA): Normal. VENOUS SINUSES: As permitted by contrast timing, patent. ANATOMIC VARIANTS: None Review of the MIP images confirms the above findings. IMPRESSION: 1. No intracranial arterial occlusion or high-grade stenosis. 2. Occlusion of the left vertebral artery at its origin with reconstitution of the distal left V4 segment due to collateral flow across the vertebrobasilar confluence. 3. Approximately 50% stenosis of the proximal right internal carotid artery secondary to low density atherosclerosis. Critical Value/emergent results were called by telephone at the time of interpretation on 03/20/2020 at 1:30 am to provider [facility] , who verbally acknowledged these results. Electronically Signed   By: Ulyses Jarred M.D.   On: 03/20/2020 01:41   MR BRAIN WO CONTRAST  Result Date: 03/20/2020 CLINICAL DATA:  65 year old female status post code stroke presentation this morning. Extremity weakness. EXAM: MRI HEAD WITHOUT CONTRAST MRI CERVICAL SPINE WITHOUT CONTRAST TECHNIQUE: Multiplanar, multiecho pulse sequences of the brain and surrounding structures, and cervical spine, to include the craniocervical junction and cervicothoracic junction, were obtained without intravenous contrast. COMPARISON:  Head CT, CTA head and neck earlier today. [facility] brain MRI 12/13/2019. CT cervical spine 12/24/2017. FINDINGS: MRI HEAD FINDINGS Brain: No restricted diffusion or evidence of acute infarction. Advanced bilateral cerebral white matter T2 and FLAIR hyperintensity which is Patchy and confluent in both hemispheres. Bilateral deep white matter capsule involvement. Superimposed chronic lacunar infarcts in the  thalami, larger on the left (series 10, image 14). T2 and FLAIR heterogeneity throughout the bilateral basal ganglia, and in the pons. Numerous chronic microhemorrhages in the deep gray nuclei, especially the thalami. Scattered hemispheric and left cerebellar chronic microhemorrhage elsewhere. Stable SWI since November. No definite cortical encephalomalacia. No midline shift, mass effect, evidence of mass lesion, ventriculomegaly, extra-axial collection or acute intracranial hemorrhage. Cervicomedullary junction and pituitary are within normal limits. Vascular: Major intracranial vascular flow voids are stable since November, preserved. Skull and upper cervical spine: Cervical spine detailed below. Visualized bone marrow signal is within normal limits. Sinuses/Orbits: Stable, and negative aside from chronic right orbital floor fracture with small volume herniated fat. Other: Trace mastoid fluid on the right is stable. Negative visible nasopharynx. MRI CERVICAL SPINE FINDINGS Alignment: Preserved cervical lordosis.  No spondylolisthesis. Vertebrae: No marrow edema or evidence of acute osseous abnormality. Visualized bone marrow signal is within normal limits. Cord: Abnormal patchy but fairly symmetric T2 and STIR hyperintense signal in the ventral cord centered at C4 (series 24, image 8). This has indistinct margins, but on axial images localizes to the ventral cord in a fairly symmetric fashion (series 26, image 14). This seems somewhat eccentric to the central cord gray matter. No cord volume loss is associated. Above and below that level the visible spinal cord appears normal. Posterior Fossa, vertebral  arteries, paraspinal tissues: Left vertebral artery flow void is lost in the neck in keeping with the CTA findings today. Cervicomedullary junction is within normal limits. Negative visible neck soft tissues and lung apices. Disc levels: C2-C3: Mild foraminal disc osteophyte complex. Mild facet hypertrophy. No  spinal stenosis. Mild to moderate right C3 foraminal stenosis. C3-C4: Foraminal disc osteophyte complex primarily on the right. No spinal stenosis. Moderate to severe right C4 foraminal stenosis. C4-C5: Minimal disc bulge and endplate spurring primarily at the foramen. No spinal stenosis. Mild to moderate right C5 foraminal stenosis. C5-C6: Minimal disc bulge and endplate spurring. Mild facet and ligament flavum hypertrophy. No spinal stenosis. Mild bilateral C6 foraminal stenosis. C6-C7: Minimal disc bulge and endplate spurring. Mild facet and ligament flavum hypertrophy. No significant stenosis. C7-T1:  Mild facet and ligament flavum hypertrophy.  No stenosis. Mild dorsal upper thoracic epidural lipomatosis. IMPRESSION: 1. Abnormal signal in the ventral cervical spinal cord centered at the lower C4 level. This does not appear related to any compressive myelopathy, and although the margins are indistinct a cord neoplasm is felt unlikely. Inflammatory or possibly ischemic (see #2) etiology are favored over other considerations. Follow-up post-contrast cervical spine imaging recommended to further characterize. 2. Severe intracranial small vessel disease appears stable since November, including numerous chronic microhemorrhages in the thalami. No acute intracranial abnormality. 3. Generally mild for age cervical spine degeneration, with no significant spinal stenosis. However, there is multilevel moderate and occasionally severe right side cervical neural foraminal stenosis. Electronically Signed   By: Genevie Ann M.D.   On: 03/20/2020 07:21   MR CERVICAL SPINE WO CONTRAST  Result Date: 03/20/2020 CLINICAL DATA:  65 year old female status post code stroke presentation this morning. Extremity weakness. EXAM: MRI HEAD WITHOUT CONTRAST MRI CERVICAL SPINE WITHOUT CONTRAST TECHNIQUE: Multiplanar, multiecho pulse sequences of the brain and surrounding structures, and cervical spine, to include the craniocervical junction  and cervicothoracic junction, were obtained without intravenous contrast. COMPARISON:  Head CT, CTA head and neck earlier today. [facility] brain MRI 12/13/2019. CT cervical spine 12/24/2017. FINDINGS: MRI HEAD FINDINGS Brain: No restricted diffusion or evidence of acute infarction. Advanced bilateral cerebral white matter T2 and FLAIR hyperintensity which is Patchy and confluent in both hemispheres. Bilateral deep white matter capsule involvement. Superimposed chronic lacunar infarcts in the thalami, larger on the left (series 10, image 14). T2 and FLAIR heterogeneity throughout the bilateral basal ganglia, and in the pons. Numerous chronic microhemorrhages in the deep gray nuclei, especially the thalami. Scattered hemispheric and left cerebellar chronic microhemorrhage elsewhere. Stable SWI since November. No definite cortical encephalomalacia. No midline shift, mass effect, evidence of mass lesion, ventriculomegaly, extra-axial collection or acute intracranial hemorrhage. Cervicomedullary junction and pituitary are within normal limits. Vascular: Major intracranial vascular flow voids are stable since November, preserved. Skull and upper cervical spine: Cervical spine detailed below. Visualized bone marrow signal is within normal limits. Sinuses/Orbits: Stable, and negative aside from chronic right orbital floor fracture with small volume herniated fat. Other: Trace mastoid fluid on the right is stable. Negative visible nasopharynx. MRI CERVICAL SPINE FINDINGS Alignment: Preserved cervical lordosis.  No spondylolisthesis. Vertebrae: No marrow edema or evidence of acute osseous abnormality. Visualized bone marrow signal is within normal limits. Cord: Abnormal patchy but fairly symmetric T2 and STIR hyperintense signal in the ventral cord centered at C4 (series 24, image 8). This has indistinct margins, but on axial images localizes to the ventral cord in a fairly symmetric  fashion (series  26, image 14). This seems somewhat eccentric to the central cord gray matter. No cord volume loss is associated. Above and below that level the visible spinal cord appears normal. Posterior Fossa, vertebral arteries, paraspinal tissues: Left vertebral artery flow void is lost in the neck in keeping with the CTA findings today. Cervicomedullary junction is within normal limits. Negative visible neck soft tissues and lung apices. Disc levels: C2-C3: Mild foraminal disc osteophyte complex. Mild facet hypertrophy. No spinal stenosis. Mild to moderate right C3 foraminal stenosis. C3-C4: Foraminal disc osteophyte complex primarily on the right. No spinal stenosis. Moderate to severe right C4 foraminal stenosis. C4-C5: Minimal disc bulge and endplate spurring primarily at the foramen. No spinal stenosis. Mild to moderate right C5 foraminal stenosis. C5-C6: Minimal disc bulge and endplate spurring. Mild facet and ligament flavum hypertrophy. No spinal stenosis. Mild bilateral C6 foraminal stenosis. C6-C7: Minimal disc bulge and endplate spurring. Mild facet and ligament flavum hypertrophy. No significant stenosis. C7-T1:  Mild facet and ligament flavum hypertrophy.  No stenosis. Mild dorsal upper thoracic epidural lipomatosis. IMPRESSION: 1. Abnormal signal in the ventral cervical spinal cord centered at the lower C4 level. This does not appear related to any compressive myelopathy, and although the margins are indistinct a cord neoplasm is felt unlikely. Inflammatory or possibly ischemic (see #2) etiology are favored over other considerations. Follow-up post-contrast cervical spine imaging recommended to further characterize. 2. Severe intracranial small vessel disease appears stable since November, including numerous chronic microhemorrhages in the thalami. No acute intracranial abnormality. 3. Generally mild for age cervical spine degeneration, with no significant spinal stenosis. However, there is  multilevel moderate and occasionally severe right side cervical neural foraminal stenosis. Electronically Signed   By: Genevie Ann M.D.   On: 03/20/2020 07:21   MR CERVICAL SPINE W CONTRAST  Result Date: 03/20/2020 CLINICAL DATA:  Neck pain. Follow-up of prior MRI of the cervical spine EXAM: MRI CERVICAL SPINE WITH CONTRAST TECHNIQUE: Multiplanar, multisequence MR imaging of the cervical spine was performed following the administration of intravenous contrast. COMPARISON:  MRI of the cervical spine March 20, 2020 at 6:34 a.m. FINDINGS: Postcontrast images obtained to evaluate T2 hyperintense lesion within the anterior aspect of the cord at the C4 level. Faint contrast enhancement is noted associated with the area of increased T2 signal without evidence of nodularity or mass effect. Contrast enhancement within the visualized left vertebral artery likely represents enhancing thrombus. Normal flow void in the right vertebral artery. IMPRESSION: 1. Postcontrast images obtained to further evaluate abnormal signal within the anterior aspect of the spinal cord at the C4 level. There is faint contrast enhancement associated with the area of increased T2 signal without evidence of nodularity or mass effect. Findings are suggestive of cord ischemia. 2. Contrast enhancement within the visualized left vertebral artery likely represents enhancing thrombus. Electronically Signed   By: Pedro Earls M.D.   On: 03/20/2020 13:22   ECHOCARDIOGRAM COMPLETE  Result Date: 03/20/2020    ECHOCARDIOGRAM REPORT   Deborah [name] Name:   NARCISSUS DETWILER Date of Exam: 03/20/2020 Medical Rec #:  [id number]      Height:       61.0 in Accession #:    [id number]     Weight:       196.0 lb Date of Birth:  [birth year]       BSA:          1.873 m Deborah [name] Age:    32 years       BP:  161/91 mmHg Deborah [name] Gender: F              HR:           80 bpm. Exam Location:  [facility] Procedure: 2D Echo Indications:    Stroke 434.91 / I163.9   History:        Deborah [name] has no prior history of Echocardiogram examinations.                 Stroke, Signs/Symptoms:Fever; Risk Factors:Hypertension,                 Diabetes and Non-Smoker. Cancer, Vertebral Artery Dissection,                 GERD.  Sonographer:    Leavy Cella Referring Phys: [id number] [place]  1. Left ventricular ejection fraction, by estimation, is 60 to 65%. The left ventricle has normal function. The left ventricle has no regional wall motion abnormalities. There is mild concentric left ventricular hypertrophy. Left ventricular diastolic parameters are indeterminate.  2. Right ventricular systolic function is normal. The right ventricular size is normal.  3. The mitral valve is normal in structure. Mild mitral valve regurgitation. No evidence of mitral stenosis.  4. The aortic valve is grossly normal. There is mild calcification of the aortic valve. There is mild thickening of the aortic valve. Aortic valve regurgitation is not visualized. No aortic stenosis is present.  5. The inferior vena cava is normal in size with greater than 50% respiratory variability, suggesting right atrial pressure of 3 mmHg. Comparison(s): No prior Echocardiogram. Conclusion(s)/Recommendation(s): No intracardiac source of embolism detected on this transthoracic study. A transesophageal echocardiogram is recommended to exclude cardiac source of embolism if clinically indicated. FINDINGS  Left Ventricle: Left ventricular ejection fraction, by estimation, is 60 to 65%. The left ventricle has normal function. The left ventricle has no regional wall motion abnormalities. The left ventricular internal cavity size was normal in size. There is  mild concentric left ventricular hypertrophy. Left ventricular diastolic parameters are indeterminate. Right Ventricle: The right ventricular size is normal. Right vetricular wall thickness was not well visualized. Right ventricular systolic function is normal.  Left Atrium: Left atrial size was normal in size. Right Atrium: Right atrial size was normal in size. Pericardium: There is no evidence of pericardial effusion. Mitral Valve: The mitral valve is normal in structure. There is mild thickening of the mitral valve leaflet(s). Mild mitral valve regurgitation. No evidence of mitral valve stenosis. Tricuspid Valve: The tricuspid valve is normal in structure. Tricuspid valve regurgitation is mild. Aortic Valve: The aortic valve is grossly normal. There is mild calcification of the aortic valve. There is mild thickening of the aortic valve. Aortic valve regurgitation is not visualized. No aortic stenosis is present. Pulmonic Valve: The pulmonic valve was not well visualized. Pulmonic valve regurgitation is not visualized. Aorta: The aortic root, ascending aorta and aortic arch are all structurally normal, with no evidence of dilitation or obstruction. Venous: The inferior vena cava is normal in size with greater than 50% respiratory variability, suggesting right atrial pressure of 3 mmHg. IAS/Shunts: The atrial septum is grossly normal.  LEFT VENTRICLE PLAX 2D LVIDd:         3.90 cm  Diastology LVIDs:         2.40 cm  LV e' medial:    5.03 cm/s LV PW:         1.70 cm  LV E/e' medial:  13.3 LV IVS:  1.20 cm  LV e' lateral:   6.53 cm/s LVOT diam:     1.80 cm  LV E/e' lateral: 10.2 LVOT Area:     2.54 cm  RIGHT VENTRICLE RV S prime:     15.50 cm/s LEFT ATRIUM             Index       RIGHT ATRIUM           Index LA diam:        4.10 cm 2.19 cm/m  [name] Area:     11.60 cm LA Vol (A2C):   44.7 ml 23.87 ml/m [name] Volume:   24.10 ml  12.87 ml/m LA Vol (A4C):   15.7 ml 8.38 ml/m LA Biplane Vol: 28.1 ml 15.01 ml/m   AORTA Ao Root diam: 2.60 cm MITRAL VALVE               TRICUSPID VALVE MV Area (PHT): 3.85 cm    TR Peak grad:   33.4 mmHg MV Decel Time: 197 msec    TR Vmax:        289.00 cm/s MR Peak grad: 68.9 mmHg MR Vmax:      415.00 cm/s  SHUNTS MV E velocity: 66.80 cm/s   Systemic Diam: 1.80 cm MV A velocity: 72.00 cm/s MV E/A ratio:  0.93 Buford Dresser MD Electronically signed by Buford Dresser MD Signature Date/Time: 03/20/2020/5:24:45 PM    Final    CT HEAD CODE STROKE WO CONTRAST  Result Date: 03/20/2020 CLINICAL DATA:  Code stroke.  Bilateral arm and leg weakness EXAM: CT HEAD WITHOUT CONTRAST TECHNIQUE: Contiguous axial images were obtained from the base of the skull through the vertex without intravenous contrast. COMPARISON:  None. FINDINGS: Brain: There is no mass, hemorrhage or extra-axial collection. The size and configuration of the ventricles and extra-axial CSF spaces are normal. Dense basal ganglia mineralization. Old left thalamic small vessel infarct. There is periventricular hypoattenuation compatible with chronic microvascular disease. Vascular: Atherosclerotic calcification of the internal carotid arteries at the skull base. No abnormal hyperdensity of the major intracranial arteries or dural venous sinuses. Skull: The visualized skull base, calvarium and extracranial soft tissues are normal. Sinuses/Orbits: No fluid levels or advanced mucosal thickening of the visualized paranasal sinuses. No mastoid or middle ear effusion. The orbits are normal. ASPECTS [facility] Stroke Program Early CT Score) - Ganglionic level infarction (caudate, lentiform nuclei, internal capsule, insula, M1-M3 cortex): 7 - Supraganglionic infarction (M4-M6 cortex): 3 Total score (0-10 with 10 being normal): 10 IMPRESSION: 1. No acute intracranial abnormality. 2. ASPECTS is 10. 3. Chronic microvascular ischemia and old left thalamic small vessel infarct. These results were communicated to Dr. Lesleigh Noe at 1:22 am on 03/20/2020 by text page via the [facility] messaging system. Electronically Signed   By: Ulyses Jarred M.D.   On: 03/20/2020 01:22       HISTORY OF PRESENT ILLNESS Ms. Beautifull Cisar is a 65 y.o. female with history of breast cancer, HTN, and prior strokes  presenting with generalized weakness, dizziness, and pain. Concern for stroke. CT head no acute abnormalities or hemorrhage.  tPA given on 03/20/2020 at 1:47am.  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   MRI showed no stroke in the brain, however MRI C-spine without contrast C4 spinal cord anterior portion faint T2 flair signal.  MRI C-spine with contrast showed possible cord ischemia.  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  EF 60 to 65%, A1c 6.5, LDL 156,  UDS negative. Creatinine 0.70.   Of note: Previously Admitted on 03/03/2018 ischemic stroke. At that time she developed sudden onset of right-sided numbness and weakness with near syncopal episode.  Deborah [name] was briefly aphasic and confused with slurring speech. CTA head/neck (03/03/2018) Right ICA origin and bulb resulting in up to 50% stenosis. Superimposed bilateral ICA siphon calcified plaque with mild Left and up to moderate Right ICA siphon stenoses. CT Perfusion (03/03/2018) detect no core infarct or penumbra. MRI Brain (03/03/2018) 10 mm focus of acute/early subacute infarction within the left lateral thalamus and posterior limb of internal capsule. HgbA1C 6.8, LDL 151. On Aspirin 81 and Plavix for 1 month then Aspirin alone. She received home health PT/OT/ST following 02/2018 discharge    [place] [facility] Deborah [name] was found to have possible cervical cord ischemia Code Stroke CT head No acute abnormality. ASPECTS 10   CTA head & neck: Occlusion of the left vertebral artery at its origin with reconstitution of the distal left V4 segment. Approximately 50% stenosis of the proximal right ICA secondary to low density atherosclerosis. MRI C-spine wo - Abnormal signal in the ventral cervical spinal cord centered at the lower C4 level MRI C-spine w contrast: faint contrast enhancement associated with the area of increased T2 signal without evidence of nodularity or mass effect. Findings are suggestive of cord ischemia.  MRI brain: Severe  intracranial small vessel disease  CT head: 24 hour post TPA: Chronic small vessel disease without acute intracranial abnormality. 2D Echo EF 60-65% LDL 156 HgbA1c 6.5 No antithrombotic prior to admission,  aspirin 81 mg daily and clopidogrel 75 mg daily  DAPT for 3 weeks and then ASA alone.  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 Acute s/p tPA Do not feel related to cervical cord ischemia  Hypertension Home meds:  Metoprolol 25mg  daily and hygroton 25 Stable Long-term BP goal normotensive   Hyperlipidemia Home meds:  Zetia 10mg  daily, resumed in hospital LDL 156, goal < 70 Add lipitor 40 Continue Zetia and statin at discharge   Hypokalemia K level 3.7 Ordered potassium 92meq po   Borderline Diabetes  Home meds: Metformin 500mg  daily HgbA1c 6.5, goal < 7.0 PCP follow up after discharge for further management   GERD On Protonix 40mg  daily  Other Active Problems Left breast cancer - she felt her left axillary lymph node enlarged than before, recommend close oncology follow-up after rehab  DISCHARGE EXAM Blood pressure 126/90, pulse (!) 58, temperature 98 F (36.7 C), temperature source Oral, resp. rate 18, height 5\' 1"  (1.549 m), weight 88.9 kg, SpO2 100 %. Physical Exam  Constitutional: Appears well-developed and well-nourished.  Psych: Affect flat, occasionally somewhat anxious Eyes: No scleral injection HENT: No oropharyngeal obstruction.  MSK: no joint deformities.  Cardiovascular: Normal rate and regular rhythm.  Respiratory: Effort normal, non-labored breathing GI: Soft.  No distension. There is no tenderness.  Skin: Warm dry and intact visible skin   Neurological Exam awake alert, oriented and interactive. Speech normal, no significant dysarthria. Follows all commands. No aphasia, CN intact. BUE deltoid 3-/5 only rise about 45 degree, bicep 4/5 and distal 4/5, RLE 4+/5, LLE 3+/5. Sensation symmetrical. DTR  2+. FTN intact slow on the left.     DISCHARGE PLAN Disposition:  Transfer to [place] for ongoing PT, OT and ST aspirin 81 mg daily and Plavix 75mg  daily for secondary stroke prevention for 3 weeks then aspirin 81mg  alone. Recommend ongoing stroke risk factor  control by Primary Care Physician at time of discharge from inpatient rehabilitation. Follow-up PCP Deborah [name], No Pcp Per in 2 weeks following discharge from rehab to evaluate Left breast cancer and axillary lymph nodes and diabetes Follow-up in [facility] with Dr. Leonie Man in 2 months following discharge from hospital to arrange repeat MRI cspine, office to schedule an appointment.   35 minutes were spent preparing discharge. I have personally obtained history,examined this Deborah [name], reviewed notes, independently viewed imaging studies, participated in medical decision making and plan of care.ROS completed by me personally and pertinent positives fully documented  I have made any additions or clarifications directly to the above note. Agree with note above.    Antony Contras, MD Medical Director [facility] Pager: [phone] 03/28/2020 1:36 PM

## 2020-03-27 NOTE — Progress Notes (Signed)
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  She reports she was in her usual state of health when she began to have left-sided neck pain while sitting and watching TV.  This was then associated with dizziness, pain all over her body and generalized weakness.  L hemiparesis worse than R hemiparesis, TPA administered. Pt with questionable spinal cord ischemia, MRI showing C4 and T2 signal abnormalities.    PT Comments    Pt remains motivated to mobilize despite fatigue with all mobility. Pt performed seated and sitting there ex for strengthening and postural control. Pt required min A for transfers and gait with increasing assistance needed with increased distance due to fatigue and unsteadiness. Pt had neck and LUE pain after use of RW. Tolerated 12' and 8' of ambulation. PT will continue to follow.   Follow Up Recommendations  CIR     Equipment Recommendations  Rolling walker with 5" wheels;3in1 (PT)    Recommendations for Other Services       Precautions / Restrictions Precautions Precautions: Fall Precaution Comments: Monitor BP Restrictions Weight Bearing Restrictions: No    Mobility  Bed Mobility Overal bed mobility: Needs Assistance Bed Mobility: Supine to Sit Rolling: Supervision   Supine to sit: Supervision     General bed mobility comments: pt takes increased time to come to EOB from flat bed with heavy use of rails. Vc's for hand position to not strain LUE and neck    Transfers Overall transfer level: Needs assistance Equipment used: Rolling walker (2 wheeled) Transfers: Sit to/from Stand Sit to Stand: Min assist         General transfer comment: min A from bed, min-guard from chair with B arm  rests.  Ambulation/Gait Ambulation/Gait assistance: Min assist Gait Distance (Feet): 20 Feet (12', 8') Assistive device: Rolling walker (2 wheeled) Gait Pattern/deviations: Decreased weight shift to left;Decreased dorsiflexion - left;Shuffle;Trunk flexed Gait velocity: decr Gait velocity interpretation: <1.31 ft/sec, indicative of household ambulator General Gait Details: pt with increasing pain LUE and neck with increasing distance. Needed seated rest break at 8'. vc's for posture and full stepping, trying to clear feet instead of shuffling. All mobility very fatiguing for pt   Stairs             Wheelchair Mobility    Modified Rankin (Stroke Patients Only) Modified Rankin (Stroke Patients Only) Pre-Morbid Rankin Score: No symptoms Modified Rankin: Moderately severe disability     Balance Overall balance assessment: Needs assistance Sitting-balance support: No upper extremity supported;Feet supported Sitting balance-Leahy Scale: Fair Sitting balance - Comments: decreased trunk control noted in sitting   Standing balance support: Bilateral upper extremity supported;During functional activity Standing balance-Leahy Scale: Poor Standing balance comment: reliant on UE support and external assist                            Cognition Arousal/Alertness: Awake/alert Behavior During Therapy: WFL for tasks assessed/performed Overall Cognitive Status: Within Functional Limits for tasks assessed                                 General Comments: slowness of movement and verbal responses but appropriate      Exercises Total Joint Exercises  Bridges: AROM;5 reps;Supine General Exercises - Lower Extremity Ankle Circles/Pumps: AROM;Both;10 reps;Supine Long Arc Quad: AROM;Both;10 reps;Seated Heel Slides: AROM;Both;10 reps;Supine Other Exercises Other Exercises: shoulder shrugs in sitting 10x Other Exercises: scapular retraction in sitting 10x Other  Exercises: elbow flex/ ext in supine 10x    General Comments General comments (skin integrity, edema, etc.): pt with mild light headedness after exertion      Pertinent Vitals/Pain Pain Assessment: Faces Faces Pain Scale: Hurts even more Pain Location: neck and UE's Pain Descriptors / Indicators: Aching;Constant Pain Intervention(s): Limited activity within patient's tolerance;Monitored during session    Home Living       Type of Home: Apartment Home Access: Level entry   Home Layout: One level        Prior Function            PT Goals (current goals can now be found in the care plan section) Acute Rehab PT Goals Patient Stated Goal: I want to move and get back to work PT Goal Formulation: With patient Time For Goal Achievement: 04/04/20 Potential to Achieve Goals: Good Progress towards PT goals: Progressing toward goals    Frequency    Min 4X/week      PT Plan Current plan remains appropriate    Co-evaluation              AM-PAC PT "6 Clicks" Mobility   Outcome Measure  Help needed turning from your back to your side while in a flat bed without using bedrails?: A Little Help needed moving from lying on your back to sitting on the side of a flat bed without using bedrails?: A Little Help needed moving to and from a bed to a chair (including a wheelchair)?: A Lot Help needed standing up from a chair using your arms (e.g., wheelchair or bedside chair)?: A Lot Help needed to walk in hospital room?: A Lot Help needed climbing 3-5 steps with a railing? : A Lot 6 Click Score: 14    End of Session Equipment Utilized During Treatment: Gait belt Activity Tolerance: Patient limited by fatigue Patient left: in chair;with call bell/phone within reach;with chair alarm set Nurse Communication: Mobility status PT Visit Diagnosis: Unsteadiness on feet (R26.81);Muscle weakness (generalized) (M62.81)     Time: 4944-9675 PT Time Calculation (min) (ACUTE ONLY):  27 min  Charges:  $Gait Training: 8-22 mins $Therapeutic Exercise: 8-22 mins                     Leighton Roach, PT  Acute Rehab Services  Pager [phone] Office [place] 03/27/2020, 9:03 AM

## 2020-03-28 DIAGNOSIS — I6509 Occlusion and stenosis of unspecified vertebral artery: Secondary | ICD-10-CM | POA: Diagnosis present

## 2020-03-28 LAB — COMPREHENSIVE METABOLIC PANEL
ALT: 73 U/L — ABNORMAL HIGH (ref 0–44)
AST: 62 U/L — ABNORMAL HIGH (ref 15–41)
Albumin: 3.4 g/dL — ABNORMAL LOW (ref 3.5–5.0)
Alkaline Phosphatase: 97 U/L (ref 38–126)
Anion gap: 12 (ref 5–15)
BUN: 18 mg/dL (ref 8–23)
CO2: 27 mmol/L (ref 22–32)
Calcium: 9.5 mg/dL (ref 8.9–10.3)
Chloride: 96 mmol/L — ABNORMAL LOW (ref 98–111)
Creatinine, Ser: 0.92 mg/dL (ref 0.44–1.00)
GFR, Estimated: >60 mL/min (ref >60)
Glucose, Bld: 138 mg/dL — ABNORMAL HIGH (ref 70–99)
Potassium: 3.6 mmol/L (ref 3.5–5.1)
Sodium: 135 mmol/L (ref 135–145)
Total Bilirubin: 0.9 mg/dL (ref 0.3–1.2)
Total Protein: 7.4 g/dL (ref 6.5–8.1)

## 2020-03-28 MED ORDER — METOPROLOL SUCCINATE ER 25 MG PO TB24: 25.0000 mg | ORAL_TABLET | Freq: Every day | ORAL | 0 refills | Status: DC

## 2020-03-28 MED ORDER — ENOXAPARIN SODIUM 40 MG/0.4ML ~~LOC~~ SOLN: 40.0000 mg | Freq: Every day | SUBCUTANEOUS | 0 refills | Status: DC

## 2020-03-28 MED ORDER — SENNOSIDES-DOCUSATE SODIUM 8.6-50 MG PO TABS: 1.0000 | ORAL_TABLET | Freq: Every evening | ORAL | 0 refills | Status: AC | PRN

## 2020-03-28 MED ORDER — ATORVASTATIN CALCIUM 40 MG PO TABS: 40.0000 mg | ORAL_TABLET | Freq: Every day | ORAL | 3 refills | Status: DC

## 2020-03-28 MED ORDER — ACETAMINOPHEN 325 MG PO TABS: 650.0000 mg | ORAL_TABLET | ORAL | 0 refills | Status: DC | PRN

## 2020-03-28 MED ORDER — POTASSIUM CHLORIDE CRYS ER 20 MEQ PO TBCR
40.0000 meq | EXTENDED_RELEASE_TABLET | Freq: Once | ORAL | Status: AC
  Administered 2020-03-28: 40 meq via ORAL
  Filled 2020-03-28: qty 2

## 2020-03-28 MED ORDER — GABAPENTIN 600 MG PO TABS: 300.0000 mg | ORAL_TABLET | Freq: Two times a day (BID) | ORAL | 0 refills | Status: DC

## 2020-03-28 MED ORDER — MELATONIN 3 MG PO TABS: 3.0000 mg | ORAL_TABLET | Freq: Every evening | ORAL | 0 refills | Status: AC | PRN

## 2020-03-28 MED ORDER — ASPIRIN 81 MG PO TBEC: 81.0000 mg | DELAYED_RELEASE_TABLET | Freq: Every day | ORAL | 11 refills | Status: DC

## 2020-03-28 MED ORDER — BISACODYL 10 MG RE SUPP: 10.0000 mg | Freq: Every day | RECTAL | 0 refills | Status: DC | PRN

## 2020-03-28 MED ORDER — CLOPIDOGREL BISULFATE 75 MG PO TABS: 75.0000 mg | ORAL_TABLET | Freq: Every day | ORAL | 0 refills | Status: AC

## 2020-03-28 NOTE — TOC Transition Note (Signed)
Transition of Care [facility]) - CM/SW Discharge Note   Patient Details  Name: Deborah [name] MRN: [id number] Date of Birth: [birth year]  [facility]) CM/SW Contact:  Pollie Friar, RN Phone Number: 03/28/2020, 10:13 AM   Clinical Narrative:    Pt is discharging to [facility]. They have bed available and able to accept. [facility] providing transport.   Number for report: [phone]   Final next level of care: IP Rehab Facility Barriers to Discharge: No Barriers Identified   Patient Goals and CMS Choice   CMS Medicare.gov Compare Post Acute Care list provided to:: Patient Choice offered to / list presented to : Patient  Discharge Placement                Patient to be transferred to facility by: [facility]      Discharge Plan and Services   Discharge Planning Services: CM Consult Post Acute Care Choice: IP Rehab                               Social Determinants of Health (SDOH) Interventions     Readmission Risk Interventions No flowsheet data found.

## 2020-03-28 NOTE — TOC Progression Note (Signed)
Transition of Care [facility]) - Progression Note    Patient Details  Name: Deborah [name] MRN: [id number] Date of Birth: 1955/03/25  Transition of Care [facility]) CM/SW Contact  Pollie Friar, RN Phone Number: 03/28/2020, 8:15 AM  Clinical Narrative:    [facility] has offered patient a bed and has started insurance auth . [facility] will update once insurance has been approved.  CM has faxed additional information needed for auth.  TOC following.    Expected Discharge Plan: IP Rehab Facility Barriers to Discharge: Continued Medical Work up  Expected Discharge Plan and Services Expected Discharge Plan: [place]   Discharge Planning Services: CM Consult Post Acute Care Choice: IP Rehab Living arrangements for the past 2 months: Single Family Home                                       Social Determinants of Health (SDOH) Interventions    Readmission Risk Interventions No flowsheet data found.

## 2020-03-28 NOTE — Progress Notes (Signed)
Wasted pt's gabapentin 300 mg after pt's. Discharge. Waste was witnessed by two RN's. Butch Penny and Loma Sousa.

## 2020-03-28 NOTE — Progress Notes (Addendum)
Pt left off unit with [facility].CCMD notified oF pts DC and IV was removed. Called [facility] and gave report to nuse [place]

## 2020-06-11 ENCOUNTER — Encounter: Payer: Self-pay | Admitting: Neurology

## 2020-06-11 ENCOUNTER — Inpatient Hospital Stay: Payer: Self-pay | Admitting: Neurology

## 2020-06-28 ENCOUNTER — Other Ambulatory Visit: Payer: Self-pay

## 2020-06-28 NOTE — Patient Outreach (Signed)
[place]) Care Management  06/28/2020  Deborah [name] 10-29-1955 [id number]   First telephone outreach attempt to obtain mRS. No answer. Unable to leave message for returned call.  Philmore Pali [facility] Management Assistant [phone]

## 2020-07-03 ENCOUNTER — Other Ambulatory Visit: Payer: Self-pay

## 2020-07-03 NOTE — Patient Outreach (Signed)
[place] [facility]) Care Management  07/03/2020  Amanat Hackel November 30, 1955 [id number]   Second telephone outreach attempt to obtain mRS. No answer. Left message for returned call.  Philmore Pali [facility] Management Assistant [phone]

## 2020-07-10 ENCOUNTER — Other Ambulatory Visit: Payer: Self-pay

## 2020-07-10 NOTE — Patient Outreach (Signed)
[place] [facility]) Care Management  07/10/2020  Deborah [name] 28-Nov-1955 [id number]   Telephone outreach to patient to obtain mRS was successfully completed. MRS= 2  Thank you, [name] Care Management Assistant

## 2022-12-19 ENCOUNTER — Other Ambulatory Visit: Payer: Self-pay

## 2022-12-19 ENCOUNTER — Encounter (HOSPITAL_BASED_OUTPATIENT_CLINIC_OR_DEPARTMENT_OTHER): Payer: Self-pay | Admitting: *Deleted

## 2022-12-19 ENCOUNTER — Emergency Department (HOSPITAL_BASED_OUTPATIENT_CLINIC_OR_DEPARTMENT_OTHER): Payer: 59

## 2022-12-19 ENCOUNTER — Inpatient Hospital Stay (HOSPITAL_BASED_OUTPATIENT_CLINIC_OR_DEPARTMENT_OTHER)
Admission: EM | Admit: 2022-12-19 | Discharge: 2022-12-28 | DRG: 035 | Disposition: A | Discharge status: Home / Self Care | Payer: 59 | Attending: Internal Medicine | Admitting: Internal Medicine

## 2022-12-19 DIAGNOSIS — I639 Cerebral infarction, unspecified: Secondary | ICD-10-CM

## 2022-12-19 DIAGNOSIS — R41840 Attention and concentration deficit: Secondary | ICD-10-CM | POA: Diagnosis present

## 2022-12-19 DIAGNOSIS — Z886 Allergy status to analgesic agent status: Secondary | ICD-10-CM

## 2022-12-19 DIAGNOSIS — Z8249 Family history of ischemic heart disease and other diseases of the circulatory system: Secondary | ICD-10-CM

## 2022-12-19 DIAGNOSIS — R29810 Facial weakness: Secondary | ICD-10-CM | POA: Diagnosis present

## 2022-12-19 DIAGNOSIS — I5022 Chronic systolic (congestive) heart failure: Secondary | ICD-10-CM | POA: Diagnosis present

## 2022-12-19 DIAGNOSIS — Z91013 Allergy to seafood: Secondary | ICD-10-CM

## 2022-12-19 DIAGNOSIS — R531 Weakness: Principal | ICD-10-CM

## 2022-12-19 DIAGNOSIS — Z853 Personal history of malignant neoplasm of breast: Secondary | ICD-10-CM

## 2022-12-19 DIAGNOSIS — K219 Gastro-esophageal reflux disease without esophagitis: Secondary | ICD-10-CM | POA: Diagnosis present

## 2022-12-19 DIAGNOSIS — Z859 Personal history of malignant neoplasm, unspecified: Secondary | ICD-10-CM

## 2022-12-19 DIAGNOSIS — G8194 Hemiplegia, unspecified affecting left nondominant side: Secondary | ICD-10-CM | POA: Diagnosis present

## 2022-12-19 DIAGNOSIS — Z7902 Long term (current) use of antithrombotics/antiplatelets: Secondary | ICD-10-CM

## 2022-12-19 DIAGNOSIS — R4701 Aphasia: Secondary | ICD-10-CM | POA: Diagnosis not present

## 2022-12-19 DIAGNOSIS — R2990 Unspecified symptoms and signs involving the nervous system: Secondary | ICD-10-CM | POA: Diagnosis present

## 2022-12-19 DIAGNOSIS — Z888 Allergy status to other drugs, medicaments and biological substances status: Secondary | ICD-10-CM

## 2022-12-19 DIAGNOSIS — E785 Hyperlipidemia, unspecified: Secondary | ICD-10-CM | POA: Diagnosis present

## 2022-12-19 DIAGNOSIS — R4189 Other symptoms and signs involving cognitive functions and awareness: Secondary | ICD-10-CM | POA: Diagnosis present

## 2022-12-19 DIAGNOSIS — I110 Hypertensive heart disease with heart failure: Secondary | ICD-10-CM | POA: Diagnosis present

## 2022-12-19 DIAGNOSIS — Z7982 Long term (current) use of aspirin: Secondary | ICD-10-CM

## 2022-12-19 DIAGNOSIS — E1165 Type 2 diabetes mellitus with hyperglycemia: Secondary | ICD-10-CM | POA: Diagnosis present

## 2022-12-19 DIAGNOSIS — I428 Other cardiomyopathies: Secondary | ICD-10-CM

## 2022-12-19 DIAGNOSIS — R0789 Other chest pain: Secondary | ICD-10-CM | POA: Diagnosis not present

## 2022-12-19 DIAGNOSIS — E669 Obesity, unspecified: Secondary | ICD-10-CM | POA: Diagnosis present

## 2022-12-19 DIAGNOSIS — I63231 Cerebral infarction due to unspecified occlusion or stenosis of right carotid arteries: Principal | ICD-10-CM

## 2022-12-19 DIAGNOSIS — Z79899 Other long term (current) drug therapy: Secondary | ICD-10-CM

## 2022-12-19 DIAGNOSIS — Z881 Allergy status to other antibiotic agents status: Secondary | ICD-10-CM

## 2022-12-19 DIAGNOSIS — I340 Nonrheumatic mitral (valve) insufficiency: Secondary | ICD-10-CM | POA: Diagnosis present

## 2022-12-19 DIAGNOSIS — Z6835 Body mass index (BMI) 35.0-35.9, adult: Secondary | ICD-10-CM

## 2022-12-19 DIAGNOSIS — I447 Left bundle-branch block, unspecified: Secondary | ICD-10-CM | POA: Diagnosis present

## 2022-12-19 DIAGNOSIS — Z8673 Personal history of transient ischemic attack (TIA), and cerebral infarction without residual deficits: Secondary | ICD-10-CM

## 2022-12-19 DIAGNOSIS — M549 Dorsalgia, unspecified: Secondary | ICD-10-CM | POA: Diagnosis not present

## 2022-12-19 DIAGNOSIS — Z800 Family history of malignant neoplasm of digestive organs: Secondary | ICD-10-CM

## 2022-12-19 DIAGNOSIS — Z9221 Personal history of antineoplastic chemotherapy: Secondary | ICD-10-CM

## 2022-12-19 DIAGNOSIS — R29704 NIHSS score 4: Secondary | ICD-10-CM | POA: Diagnosis present

## 2022-12-19 DIAGNOSIS — Z7984 Long term (current) use of oral hypoglycemic drugs: Secondary | ICD-10-CM

## 2022-12-19 DIAGNOSIS — Z803 Family history of malignant neoplasm of breast: Secondary | ICD-10-CM

## 2022-12-19 LAB — LIPID PANEL
Cholesterol: 259 mg/dL — ABNORMAL HIGH (ref 0–200)
HDL: 49 mg/dL (ref >40)
LDL Cholesterol: 183 mg/dL — ABNORMAL HIGH (ref 0–99)
Total CHOL/HDL Ratio: 5.3 {ratio}
Triglycerides: 136 mg/dL (ref <150)
VLDL: 27 mg/dL (ref 0–40)

## 2022-12-19 LAB — PROTIME-INR
INR: 0.9 (ref 0.8–1.2)
Prothrombin Time: 12.5 s (ref 11.4–15.2)

## 2022-12-19 LAB — COMPREHENSIVE METABOLIC PANEL
ALT: 24 U/L (ref 0–44)
AST: 28 U/L (ref 15–41)
Albumin: 4.1 g/dL (ref 3.5–5.0)
Alkaline Phosphatase: 95 U/L (ref 38–126)
Anion gap: 10 (ref 5–15)
BUN: 15 mg/dL (ref 8–23)
CO2: 25 mmol/L (ref 22–32)
Calcium: 9.3 mg/dL (ref 8.9–10.3)
Chloride: 103 mmol/L (ref 98–111)
Creatinine, Ser: 0.94 mg/dL (ref 0.44–1.00)
GFR, Estimated: >60 mL/min (ref >60)
Glucose, Bld: 142 mg/dL — ABNORMAL HIGH (ref 70–99)
Potassium: 4.2 mmol/L (ref 3.5–5.1)
Sodium: 138 mmol/L (ref 135–145)
Total Bilirubin: 0.7 mg/dL (ref <1.2)
Total Protein: 9 g/dL — ABNORMAL HIGH (ref 6.5–8.1)

## 2022-12-19 LAB — CBC WITH DIFFERENTIAL/PLATELET
Abs Immature Granulocytes: 0.03 10*3/uL (ref 0.00–0.07)
Basophils Absolute: 0 10*3/uL (ref 0.0–0.1)
Basophils Relative: 0 %
Eosinophils Absolute: 0 10*3/uL (ref 0.0–0.5)
Eosinophils Relative: 0 %
HCT: 43.8 % (ref 36.0–46.0)
Hemoglobin: 14.1 g/dL (ref 12.0–15.0)
Immature Granulocytes: 0 %
Lymphocytes Relative: 25 %
Lymphs Abs: 1.9 10*3/uL (ref 0.7–4.0)
MCH: 28.1 pg (ref 26.0–34.0)
MCHC: 32.2 g/dL (ref 30.0–36.0)
MCV: 87.4 fL (ref 80.0–100.0)
Monocytes Absolute: 0.7 10*3/uL (ref 0.1–1.0)
Monocytes Relative: 9 %
Neutro Abs: 5.2 10*3/uL (ref 1.7–7.7)
Neutrophils Relative %: 66 %
Platelets: 231 10*3/uL (ref 150–400)
RBC: 5.01 MIL/uL (ref 3.87–5.11)
RDW: 15.7 % — ABNORMAL HIGH (ref 11.5–15.5)
WBC: 7.9 10*3/uL (ref 4.0–10.5)
nRBC: 0 % (ref 0.0–0.2)

## 2022-12-19 LAB — TROPONIN I (HIGH SENSITIVITY)
Troponin I (High Sensitivity): 10 ng/L (ref <18)
Troponin I (High Sensitivity): 9 ng/L (ref <18)

## 2022-12-19 LAB — APTT: aPTT: 23 s — ABNORMAL LOW (ref 24–36)

## 2022-12-19 LAB — HEMOGLOBIN A1C
Hgb A1c MFr Bld: 6.9 % — ABNORMAL HIGH (ref 4.8–5.6)
Mean Plasma Glucose: 151.33 mg/dL

## 2022-12-19 LAB — CBG MONITORING, ED: Glucose-Capillary: 126 mg/dL — ABNORMAL HIGH (ref 70–99)

## 2022-12-19 MED ORDER — SODIUM CHLORIDE 0.9 % IV SOLN: INTRAVENOUS | Status: DC

## 2022-12-19 MED ORDER — HEPARIN SODIUM (PORCINE) 5000 UNIT/ML IJ SOLN
4000.0000 [IU] | Freq: Once | INTRAMUSCULAR | Status: AC
  Administered 2022-12-19: 4000 [IU] via INTRAVENOUS
  Filled 2022-12-19: qty 1

## 2022-12-19 MED ORDER — IOHEXOL 350 MG/ML SOLN
75.0000 mL | Freq: Once | INTRAVENOUS | Status: AC | PRN
  Administered 2022-12-19: 75 mL via INTRAVENOUS

## 2022-12-19 MED ORDER — ACETAMINOPHEN 500 MG PO TABS
1000.0000 mg | ORAL_TABLET | Freq: Once | ORAL | Status: AC
  Administered 2022-12-19: 1000 mg via ORAL
  Filled 2022-12-19: qty 2

## 2022-12-19 MED ORDER — ASPIRIN 81 MG PO CHEW
324.0000 mg | CHEWABLE_TABLET | Freq: Once | ORAL | Status: AC
Start: 2022-12-19 — End: 2022-12-19
  Administered 2022-12-19: 324 mg via ORAL
  Filled 2022-12-19: qty 4

## 2022-12-19 MED ORDER — ONDANSETRON HCL 4 MG/2ML IJ SOLN
4.0000 mg | Freq: Once | INTRAMUSCULAR | Status: AC
  Administered 2022-12-19: 4 mg via INTRAVENOUS
  Filled 2022-12-19: qty 2

## 2022-12-19 NOTE — ED Triage Notes (Addendum)
Pt reports right leg numbness and weakness since she woke up this am at 7.  Hx of CVA x2.  LSN 9pm last night.  EDP evaluated her in triage. Pt reports fall around 2pm

## 2022-12-19 NOTE — ED Provider Notes (Signed)
[facility] Provider Note   CSN: [id number] Arrival date & time: 12/19/22  1704     History Chief Complaint  Patient presents with   Weakness    HPI Deborah [name] is a 67 y.o. female presenting for left arm and left facial weakness.  Woke up this morning at 7 AM with the symptoms went to bed last night at 9 PM.  Last known well time does 9 PM last night.  Symptoms are persistent throughout the day.  History of CVA though had better strength yesterday per patient.  Patient's recorded medical, surgical, social, medication list and allergies were reviewed in the Snapshot window as part of the initial history.   Review of Systems   Review of Systems  Constitutional:  Negative for chills and fever.  HENT:  Negative for ear pain and sore throat.   Eyes:  Negative for pain and visual disturbance.  Respiratory:  Negative for cough and shortness of breath.   Cardiovascular:  Positive for chest pain. Negative for palpitations.  Gastrointestinal:  Negative for abdominal pain and vomiting.  Genitourinary:  Negative for dysuria and hematuria.  Musculoskeletal:  Negative for arthralgias and back pain.  Skin:  Negative for color change and rash.  Neurological:  Negative for seizures and syncope.  All other systems reviewed and are negative.   Physical Exam Updated Vital Signs BP (!) 148/80   Pulse 68   Temp 98.3 F (36.8 C) (Oral)   Resp 17   SpO2 94%  Physical Exam   ED Course/ Medical Decision Making/ A&P Clinical Course as of 12/19/22 2223  Fri Dec 19, 2022  4098 Code STEMI activated for the EKG changes.  Discussed with cardiology they agree that patient needs an emergent catheterization.  However given the atypical nature of the patient's symptoms, they have requested a troponin prior to transfer for catheterization.  If initial troponin is negative, patient be medically admitted and undergo catheterization asynchronously during  normal schedule.  If troponin is elevated then patient will require more emergent cardiac evaluation tonight. [CC]    Clinical Course User Index [CC] Glyn Ade, MD    Procedures .Critical Care  Performed by: Glyn Ade, MD Authorized by: Glyn Ade, MD   Critical care provider statement:    Critical care time (minutes):  95   Critical care was time spent personally by me on the following activities:  Development of treatment plan with patient or surrogate, discussions with consultants, evaluation of patient's response to treatment, examination of patient, ordering and review of laboratory studies, ordering and review of radiographic studies, ordering and performing treatments and interventions, pulse oximetry, re-evaluation of patient's condition and review of old charts    Medications Ordered in ED Medications  0.9 %  sodium chloride infusion ( Intravenous Stopped 12/19/22 2030)  aspirin chewable tablet 324 mg (324 mg Oral Given 12/19/22 1741)  heparin injection 4,000 Units (4,000 Units Intravenous Given 12/19/22 1742)  iohexol (OMNIPAQUE) 350 MG/ML injection 75 mL (75 mLs Intravenous Contrast Given 12/19/22 1909)  ondansetron (ZOFRAN) injection 4 mg (4 mg Intravenous Given 12/19/22 1950)  acetaminophen (TYLENOL) tablet 1,000 mg (1,000 mg Oral Given 12/19/22 1949)  iohexol (OMNIPAQUE) 350 MG/ML injection 75 mL (75 mLs Intravenous Contrast Given 12/19/22 2205)    Medical Decision Making:   67 year old female coming in with left arm and facial weakness.  Symptoms present for approximately 16 hours at time of arrival therefore patient outside a code stroke.  Patient  with a bed at 9 PM asymptomatic woke up at 7 AM symptomatic.  While evaluating for cerebral etiology of the patient's symptoms, a EKG was performed that demonstrated a new left bundle branch ST elevations and ST depressions.  However she is having no chest pain and only has this vague feeling of malaise  and left arm numbness.  However given the EKG findings I activated as a code STEMI and was immediately connected with Dr. Jacinto Halim of cardiology.  We reviewed the case and the EKG findings.  He recommended getting serial troponins and updating him if they were positive.  If they were negative he stated that patient could be cathed asynchronously.  Both troponins were negative.  I consulted neurology for the patient's original symptoms of left arm weakness.  The weakness appears to be improving by time of arrival in this conversation. Neurology recommended MRI and CTA head and neck.  As MRI is not available at this facility, patient will require transfer to main campus.  Consulted with medicine who is in agreement for admission given the multiple ongoing symptoms and workups.   Disposition:   Based on the above findings, I believe this patient is stable for admission.    Patient/family educated about specific findings on our evaluation and explained exact reasons for admission.  Patient/family educated about clinical situation and time was allowed to answer questions.   Admission team communicated with and agreed with need for admission. Patient admitted. Patient ready to move at this time.     Emergency Department Medication Summary:   Medications  0.9 %  sodium chloride infusion ( Intravenous Stopped 12/19/22 2030)  aspirin chewable tablet 324 mg (324 mg Oral Given 12/19/22 1741)  heparin injection 4,000 Units (4,000 Units Intravenous Given 12/19/22 1742)  iohexol (OMNIPAQUE) 350 MG/ML injection 75 mL (75 mLs Intravenous Contrast Given 12/19/22 1909)  ondansetron (ZOFRAN) injection 4 mg (4 mg Intravenous Given 12/19/22 1950)  acetaminophen (TYLENOL) tablet 1,000 mg (1,000 mg Oral Given 12/19/22 1949)  iohexol (OMNIPAQUE) 350 MG/ML injection 75 mL (75 mLs Intravenous Contrast Given 12/19/22 2205)        Clinical Impression:  1. Weakness      Admit   Final Clinical Impression(s) / ED  Diagnoses Final diagnoses:  Weakness    Rx / DC Orders ED Discharge Orders     None         Glyn Ade, MD 12/19/22 2223
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
## 2022-12-19 NOTE — ED Notes (Signed)
Patient placed on defibrillation pads with Vear Clock at bedside on alert.

## 2022-12-19 NOTE — Plan of Care (Signed)
Plan of Care Note for accepted transfer   Patient name: Deborah [name] ZOX:[id number] DOB: [birth year]  Facility requesting transfer: [facility] Requesting Provider: Dr. 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  LKW 9 PM yesterday.  EKG showing new left bundle branch block and ST elevations.  Patient is not having any chest pain or dyspnea.  Troponin negative x 2.  CT head without contrast: IMPRESSION: 1. No CT evidence for acute intracranial abnormality. 2. Advanced chronic small vessel ischemic changes of the white matter. Small chronic left thalamic infarct.  CTA chest: IMPRESSION: 1. Negative for acute pulmonary embolus. 2. Cardiomegaly. Small right-sided pleural effusion with some fluid along the right pulmonary fissure. 3. Bronchiectasis and bandlike scarring in the left upper lobe. 4. Aortic atherosclerosis.  Patient was given Tylenol, aspirin, IV heparin, and Zofran.  Neurology consulted (Dr. Amada Jupiter) and recommended obtaining brain MRI and CTA head and neck for stroke workup.  Also EDP spoke to Dr. Jacinto Halim regarding EKG changes, cardiology will consult in the morning.  Plan of care: The patient is accepted for admission to Telemetry unit at [facility].  [facility] will assume care on arrival to accepting facility. Until arrival, care as per EDP. However, [facility] available 24/7 for questions and assistance.  Check [email or web address] for on-call coverage.  Nursing staff, please call [facility] Admits & Consults System-Wide number under Amion on patient's arrival so appropriate admitting provider can evaluate the pt.

## 2022-12-19 NOTE — ED Notes (Signed)
Patient was transported to CT on Zoll and portable monitor. No changes in the patients status while in CT.

## 2022-12-19 NOTE — Discharge Instructions (Addendum)
   [facility]  Discharge Instructions   Carotid Surgery  Please refer to the following instructions for your post-procedure care. Your surgeon or physician assistant will discuss any changes with you.  Activity  You are encouraged to walk as much as you can. You can slowly return to normal activities but must avoid strenuous activity and heavy lifting until your doctor tell you it's okay. Avoid activities such as vacuuming or swinging a golf club. You can drive after one week if you are comfortable and you are no longer taking prescription pain medications. It is normal to feel tired for serval weeks after your surgery. It is also normal to have difficulty with sleep habits, eating, and bowel movements after surgery. These will go away with time.  Bathing/Showering  Shower daily after you go home. Do not soak in a bathtub, hot tub, or swim until the incision heals completely.  Incision Care  Shower every day. Clean your incision with mild soap and water. Pat the area dry with a clean towel. You do not need a bandage unless otherwise instructed. Do not apply any ointments or creams to your incision. You may have skin glue on your incision. Do not peel it off. It will come off on its own in about one week. Your incision may feel thickened and raised for several weeks after your surgery. This is normal and the skin will soften over time.   For Men Only: It's okay to shave around the incision but do not shave the incision itself for 2 weeks. It is common to have numbness under your chin that could last for several months.  Diet  Resume your normal diet. There are no special food restrictions following this procedure. A low fat/low cholesterol diet is recommended for all patients with vascular disease. In order to heal from your surgery, it is CRITICAL to get adequate nutrition. Your body requires vitamins, minerals, and protein. Vegetables are the best source of  vitamins and minerals. Vegetables also provide the perfect balance of protein. Processed food has little nutritional value, so try to avoid this.  Medications  Resume taking all of your medications unless your doctor or physician assistant tells you not to. If your incision is causing pain, you may take over-the- counter pain relievers such as acetaminophen (Tylenol). If you were prescribed a stronger pain medication, please be aware these medications can cause nausea and constipation. Prevent nausea by taking the medication with a snack or meal. Avoid constipation by drinking plenty of fluids and eating foods with a high amount of fiber, such as fruits, vegetables, and grains.   Do not take Tylenol if you are taking prescription pain medications.  Follow Up  Our office will schedule a follow up appointment 2-3 weeks following discharge.  Please call us immediately for any of the following conditions  . Increased pain, redness, drainage (pus) from your incision site. . Fever of 101 degrees or higher. . If you should develop stroke (slurred speech, difficulty swallowing, weakness on one side of your body, loss of vision) you should call 911 and go to the nearest emergency room. .  Reduce your risk of vascular disease:  . Stop smoking. If you would like help call [facility] at 1-800-QUIT-NOW (1-800-784-8669) or [place] at 336-586-4000. . Manage your cholesterol . Maintain a desired weight . Control your diabetes . Keep your blood pressure down .  If you have any questions, please call the office at [phone]. 

## 2022-12-19 NOTE — ED Notes (Signed)
[facility] called for transport. 

## 2022-12-19 NOTE — ED Notes (Signed)
[facility] on site to transport patient to [facility]

## 2022-12-19 NOTE — ED Notes (Signed)
Pt placed on zoll pads, continues to be on monitor

## 2022-12-19 NOTE — ED Notes (Signed)
Call to lab to add labs

## 2022-12-20 ENCOUNTER — Observation Stay (HOSPITAL_COMMUNITY): Payer: 59

## 2022-12-20 ENCOUNTER — Encounter (HOSPITAL_COMMUNITY): Payer: Self-pay | Admitting: Internal Medicine

## 2022-12-20 DIAGNOSIS — M549 Dorsalgia, unspecified: Secondary | ICD-10-CM | POA: Diagnosis not present

## 2022-12-20 DIAGNOSIS — Z7982 Long term (current) use of aspirin: Secondary | ICD-10-CM

## 2022-12-20 DIAGNOSIS — R0789 Other chest pain: Secondary | ICD-10-CM | POA: Diagnosis not present

## 2022-12-20 DIAGNOSIS — I6521 Occlusion and stenosis of right carotid artery: Secondary | ICD-10-CM | POA: Diagnosis not present

## 2022-12-20 DIAGNOSIS — Z79899 Other long term (current) drug therapy: Secondary | ICD-10-CM | POA: Diagnosis not present

## 2022-12-20 DIAGNOSIS — Z888 Allergy status to other drugs, medicaments and biological substances status: Secondary | ICD-10-CM | POA: Diagnosis not present

## 2022-12-20 DIAGNOSIS — I340 Nonrheumatic mitral (valve) insufficiency: Secondary | ICD-10-CM | POA: Diagnosis present

## 2022-12-20 DIAGNOSIS — I639 Cerebral infarction, unspecified: Secondary | ICD-10-CM | POA: Diagnosis present

## 2022-12-20 DIAGNOSIS — Z7984 Long term (current) use of oral hypoglycemic drugs: Secondary | ICD-10-CM

## 2022-12-20 DIAGNOSIS — R4189 Other symptoms and signs involving cognitive functions and awareness: Secondary | ICD-10-CM | POA: Diagnosis present

## 2022-12-20 DIAGNOSIS — E669 Obesity, unspecified: Secondary | ICD-10-CM | POA: Diagnosis present

## 2022-12-20 DIAGNOSIS — Z886 Allergy status to analgesic agent status: Secondary | ICD-10-CM | POA: Diagnosis not present

## 2022-12-20 DIAGNOSIS — R41840 Attention and concentration deficit: Secondary | ICD-10-CM | POA: Diagnosis present

## 2022-12-20 DIAGNOSIS — Z881 Allergy status to other antibiotic agents status: Secondary | ICD-10-CM | POA: Diagnosis not present

## 2022-12-20 DIAGNOSIS — R29704 NIHSS score 4: Secondary | ICD-10-CM | POA: Diagnosis present

## 2022-12-20 DIAGNOSIS — I428 Other cardiomyopathies: Secondary | ICD-10-CM

## 2022-12-20 DIAGNOSIS — I110 Hypertensive heart disease with heart failure: Secondary | ICD-10-CM | POA: Diagnosis present

## 2022-12-20 DIAGNOSIS — R4701 Aphasia: Secondary | ICD-10-CM | POA: Diagnosis not present

## 2022-12-20 DIAGNOSIS — R079 Chest pain, unspecified: Secondary | ICD-10-CM | POA: Diagnosis not present

## 2022-12-20 DIAGNOSIS — I69398 Other sequelae of cerebral infarction: Secondary | ICD-10-CM | POA: Diagnosis not present

## 2022-12-20 DIAGNOSIS — Z859 Personal history of malignant neoplasm, unspecified: Secondary | ICD-10-CM | POA: Diagnosis not present

## 2022-12-20 DIAGNOSIS — I6389 Other cerebral infarction: Secondary | ICD-10-CM | POA: Diagnosis not present

## 2022-12-20 DIAGNOSIS — Z7902 Long term (current) use of antithrombotics/antiplatelets: Secondary | ICD-10-CM

## 2022-12-20 DIAGNOSIS — I447 Left bundle-branch block, unspecified: Secondary | ICD-10-CM | POA: Diagnosis present

## 2022-12-20 DIAGNOSIS — I63231 Cerebral infarction due to unspecified occlusion or stenosis of right carotid arteries: Secondary | ICD-10-CM | POA: Diagnosis present

## 2022-12-20 DIAGNOSIS — I5022 Chronic systolic (congestive) heart failure: Secondary | ICD-10-CM | POA: Diagnosis present

## 2022-12-20 DIAGNOSIS — G8194 Hemiplegia, unspecified affecting left nondominant side: Secondary | ICD-10-CM | POA: Diagnosis present

## 2022-12-20 DIAGNOSIS — E785 Hyperlipidemia, unspecified: Secondary | ICD-10-CM | POA: Diagnosis present

## 2022-12-20 DIAGNOSIS — E1165 Type 2 diabetes mellitus with hyperglycemia: Secondary | ICD-10-CM

## 2022-12-20 DIAGNOSIS — K219 Gastro-esophageal reflux disease without esophagitis: Secondary | ICD-10-CM | POA: Diagnosis present

## 2022-12-20 DIAGNOSIS — R29810 Facial weakness: Secondary | ICD-10-CM | POA: Diagnosis present

## 2022-12-20 DIAGNOSIS — R531 Weakness: Secondary | ICD-10-CM | POA: Diagnosis present

## 2022-12-20 LAB — GLUCOSE, CAPILLARY
Glucose-Capillary: 230 mg/dL — ABNORMAL HIGH (ref 70–99)
Glucose-Capillary: 101 mg/dL — ABNORMAL HIGH (ref 70–99)
Glucose-Capillary: 195 mg/dL — ABNORMAL HIGH (ref 70–99)

## 2022-12-20 LAB — LIPID PANEL
Cholesterol: 206 mg/dL — ABNORMAL HIGH (ref 0–200)
HDL: 37 mg/dL — ABNORMAL LOW (ref >40)
LDL Cholesterol: 152 mg/dL — ABNORMAL HIGH (ref 0–99)
Total CHOL/HDL Ratio: 5.6 {ratio}
Triglycerides: 87 mg/dL (ref <150)
VLDL: 17 mg/dL (ref 0–40)

## 2022-12-20 LAB — HIV ANTIBODY (ROUTINE TESTING W REFLEX): HIV Screen 4th Generation wRfx: NONREACTIVE

## 2022-12-20 MED ORDER — STROKE: EARLY STAGES OF RECOVERY BOOK
Freq: Once | Status: AC
  Administered 2022-12-20: 1
  Filled 2022-12-20: qty 1

## 2022-12-20 MED ORDER — EZETIMIBE 10 MG PO TABS
10.0000 mg | ORAL_TABLET | Freq: Every day | ORAL | Status: DC
  Administered 2022-12-20 – 2022-12-28 (×8): 10 mg via ORAL
  Filled 2022-12-20 (×9): qty 1

## 2022-12-20 MED ORDER — ASPIRIN 81 MG PO CHEW: 324.0000 mg | CHEWABLE_TABLET | Freq: Once | ORAL | Status: DC

## 2022-12-20 MED ORDER — LIDOCAINE 5 % EX PTCH
1.0000 | MEDICATED_PATCH | CUTANEOUS | Status: AC
  Administered 2022-12-20: 1 via TRANSDERMAL
  Filled 2022-12-20: qty 1

## 2022-12-20 MED ORDER — ASPIRIN 81 MG PO TBEC
81.0000 mg | DELAYED_RELEASE_TABLET | Freq: Every day | ORAL | Status: DC
  Administered 2022-12-20 – 2022-12-22 (×3): 81 mg via ORAL
  Filled 2022-12-20 (×3): qty 1

## 2022-12-20 MED ORDER — INSULIN ASPART 100 UNIT/ML IJ SOLN
0.0000 [IU] | Freq: Three times a day (TID) | INTRAMUSCULAR | Status: DC
Start: 2022-12-20 — End: 2022-12-28
  Administered 2022-12-20: 5 [IU] via SUBCUTANEOUS
  Administered 2022-12-23 (×2): 3 [IU] via SUBCUTANEOUS
  Administered 2022-12-24: 2 [IU] via SUBCUTANEOUS
  Administered 2022-12-27: 3 [IU] via SUBCUTANEOUS

## 2022-12-20 MED ORDER — STROKE: EARLY STAGES OF RECOVERY BOOK: Freq: Once | Status: DC

## 2022-12-20 MED ORDER — ROSUVASTATIN CALCIUM 20 MG PO TABS
40.0000 mg | ORAL_TABLET | Freq: Every day | ORAL | Status: DC
  Administered 2022-12-20: 40 mg via ORAL
  Filled 2022-12-20: qty 2

## 2022-12-20 MED ORDER — ATORVASTATIN CALCIUM 80 MG PO TABS
80.0000 mg | ORAL_TABLET | Freq: Every day | ORAL | Status: DC
  Administered 2022-12-21 – 2022-12-28 (×7): 80 mg via ORAL
  Filled 2022-12-20 (×8): qty 1

## 2022-12-20 MED ORDER — ACETAMINOPHEN 500 MG PO TABS: 1000.0000 mg | ORAL_TABLET | Freq: Once | ORAL | Status: DC

## 2022-12-20 MED ORDER — ENOXAPARIN SODIUM 40 MG/0.4ML IJ SOSY
40.0000 mg | PREFILLED_SYRINGE | INTRAMUSCULAR | Status: DC
  Administered 2022-12-20 – 2022-12-22 (×3): 40 mg via SUBCUTANEOUS
  Filled 2022-12-20 (×3): qty 0.4

## 2022-12-20 MED ORDER — STROKE: EARLY STAGES OF RECOVERY BOOK
Freq: Once | Status: AC
  Filled 2022-12-20: qty 1

## 2022-12-20 MED ORDER — ACETAMINOPHEN 160 MG/5ML PO SOLN: 650.0000 mg | ORAL | Status: DC | PRN

## 2022-12-20 MED ORDER — CLOPIDOGREL BISULFATE 75 MG PO TABS
75.0000 mg | ORAL_TABLET | Freq: Every day | ORAL | Status: DC
  Administered 2022-12-20: 75 mg via ORAL
  Filled 2022-12-20: qty 1

## 2022-12-20 MED ORDER — BISACODYL 10 MG RE SUPP
10.0000 mg | Freq: Every day | RECTAL | Status: DC | PRN
  Administered 2022-12-25: 10 mg via RECTAL
  Filled 2022-12-20: qty 1

## 2022-12-20 MED ORDER — INSULIN ASPART 100 UNIT/ML IJ SOLN: 0.0000 [IU] | Freq: Every day | INTRAMUSCULAR | Status: DC

## 2022-12-20 MED ORDER — ACETAMINOPHEN 325 MG PO TABS
650.0000 mg | ORAL_TABLET | ORAL | Status: DC | PRN
  Administered 2022-12-20 – 2022-12-23 (×5): 650 mg via ORAL
  Filled 2022-12-20 (×5): qty 2

## 2022-12-20 MED ORDER — TRAMADOL HCL 50 MG PO TABS
50.0000 mg | ORAL_TABLET | Freq: Once | ORAL | Status: AC
  Administered 2022-12-20: 50 mg via ORAL
  Filled 2022-12-20: qty 1

## 2022-12-20 MED ORDER — ACETAMINOPHEN 650 MG RE SUPP: 650.0000 mg | RECTAL | Status: DC | PRN

## 2022-12-20 NOTE — Assessment & Plan Note (Signed)
Hold metformin Mod scale SSI AC/HS for the moment

## 2022-12-20 NOTE — Evaluation (Signed)
Physical Therapy Evaluation Patient Details Name: Deborah [name] MRN: [id number] DOB: 02-14-1955 Today's Date: 12/20/2022  History of Present Illness  Pt is 67 yo presenting to [facility] with reports of L sided numbness that was present on awakening on 11/22. 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  Clinical Impression  Pt is presenting below baseline level of functioning. Currently pt requires Min A for bed mobility, min A for sit to stand from EOB with RW and Min A for navigation during gait and to maintain balance to prevent falls. Pt is motivated and has good family support. Due to pt PLOF, home set up and available assistance at home recommending skilled physical therapy services > 3 hours/day on discharge from acute care hospital setting in order to address strength, balance, functional mobility to decrease risk for falls, injury and re-hospitalization.         If plan is discharge home, recommend the following: A little help with walking and/or transfers;Assistance with cooking/housework;Assist for transportation;Supervision due to cognitive status     Equipment Recommendations Other (comment) (defer to post acute)  Recommendations for Other Services  Rehab consult    Functional Status Assessment Patient has had a recent decline in their functional status and demonstrates the ability to make significant improvements in function in a reasonable and predictable amount of time.     Precautions / Restrictions Precautions Precautions: Fall Restrictions Weight Bearing Restrictions: No      Mobility  Bed Mobility Overal bed mobility: Needs Assistance Bed Mobility: Sit to Supine     Supine to sit:  (with increased time and increased effort; with cues for initiation, sequencing, and technique) Sit to supine: Min assist   General bed mobility comments: increased time     Transfers Overall transfer level: Needs assistance Equipment used: Rolling walker (2 wheels) Transfers: Sit to/from Stand Sit to Stand: Min assist (to power up; with cues for hand placement/technique)   Step pivot transfers:  (with use of RW and cues for attention to L side, initiation, safety, sequencing, and navigating in room)       General transfer comment: Min A with verbal cues for safe hand placement.    Ambulation/Gait Ambulation/Gait assistance: Min assist Gait Distance (Feet): 60 Feet Assistive device: Rolling walker (2 wheels) Gait Pattern/deviations: Step-through pattern, Decreased stance time - left, Decreased step length - left, Decreased step length - right, Drifts right/left Gait velocity: decreased Gait velocity interpretation: <1.8 ft/sec, indicate of risk for recurrent falls   General Gait Details: Pt drifts L to L hand side of RW requires verbal cues to scan area and Assist with navigating AD to avoid hitting objects on the L    Modified Rankin (Stroke Patients Only) Modified Rankin (Stroke Patients Only) Pre-Morbid Rankin Score: No symptoms Modified Rankin: Moderate disability     Balance Overall balance assessment: Needs assistance Sitting-balance support: Single extremity supported, Feet supported, Bilateral upper extremity supported, No upper extremity supported Sitting balance-Leahy Scale: Fair     Standing balance support: Bilateral upper extremity supported, During functional activity, Reliant on assistive device for balance Standing balance-Leahy Scale: Poor           Home Living Family/patient expects to be discharged to:: Private residence Living Arrangements: Children;Other (Comment) (grandchildren; pt's children are all adults and some grandchilren are adults and some are minors) Available Help at Discharge: Family;Available 24 hours/day;Friend(s);Neighbor Type of Home: [facility]  Access: Level entry       Home Layout: One  level Home Equipment: Hand held shower head Additional Comments: 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    Prior Function Prior Level of Function : Independent/Modified Independent             Mobility Comments: Independent without an AD ADLs Comments: Independent with ADLs, IADLs, and drives     Extremity/Trunk Assessment   Upper Extremity Assessment Upper Extremity Assessment: Defer to OT evaluation RUE Deficits / Details: generalized weakness; decreased fine motor coordination RUE Sensation: WNL RUE Coordination: decreased fine motor LUE Deficits / Details: generalized weakness; decreased AROM shoulder shoulder flexion; PROM shoulder flexion WFL but with pt reporting soreness; decreased propreoception; decreased fine and gross motor coordinaiton; L side neglect LUE Sensation: decreased light touch;decreased proprioception LUE Coordination: decreased fine motor;decreased gross motor    Lower Extremity Assessment Lower Extremity Assessment: LLE deficits/detail LLE Deficits / Details: Weakness 4-/5 LLE Sensation: decreased light touch    Cervical / Trunk Assessment Cervical / Trunk Assessment: Normal  Communication   Communication Communication: No apparent difficulties  Cognition Arousal: Alert Behavior During Therapy: WFL for tasks assessed/performed Overall Cognitive Status: Impaired/Different from baseline Area of Impairment: Attention, Memory, Following commands, Safety/judgement, Awareness, Problem solving       Following Commands: Follows one step commands consistently, Follows one step commands with increased time, Follows multi-step commands inconsistently, Follows multi-step commands with increased time Safety/Judgement: Decreased awareness of safety, Decreased awareness of deficits   Problem Solving: Slow processing, Decreased initiation, Difficulty sequencing, Requires verbal cues General Comments: AAOx4 and pleasant throughout session.                Assessment/Plan    PT Assessment Patient needs continued PT services  PT Problem List Decreased strength;Decreased mobility;Decreased safety awareness;Decreased activity tolerance;Decreased balance       PT Treatment Interventions DME instruction;Therapeutic exercise;Gait training;Balance training;Neuromuscular re-education;Functional mobility training;Therapeutic activities;Patient/family education    PT Goals (Current goals can be found in the Care Plan section)  Acute Rehab PT Goals Patient Stated Goal: to get stronger and go home PT Goal Formulation: With patient Time For Goal Achievement: 01/03/23 Potential to Achieve Goals: Good    Frequency Min 1X/week        AM-PAC PT "6 Clicks" Mobility  Outcome Measure Help needed turning from your back to your side while in a flat bed without using bedrails?: A Little Help needed moving from lying on your back to sitting on the side of a flat bed without using bedrails?: A Little Help needed moving to and from a bed to a chair (including a wheelchair)?: A Little Help needed standing up from a chair using your arms (e.g., wheelchair or bedside chair)?: A Little Help needed to walk in hospital room?: A Little Help needed climbing 3-5 steps with a railing? : A Lot 6 Click Score: 17    End of Session Equipment Utilized During Treatment: Gait belt Activity Tolerance: Patient tolerated treatment well Patient left: in bed;with call bell/phone within reach;with bed alarm set Nurse Communication: Mobility status PT Visit Diagnosis: Other abnormalities of gait and mobility (R26.89)    Time: 4098-1191 PT Time Calculation (min) (ACUTE ONLY): 13 min   Charges:   PT Evaluation $PT Eval Low Complexity: 1 Low   PT General Charges $$ ACUTE PT VISIT: 1 Visit       Harrel Carina, DPT, CLT  Acute Rehabilitation Services Office: [phone] (Secure chat preferred)  Claudia Desanctis 12/20/2022, 3:42 PM

## 2022-12-20 NOTE — Evaluation (Signed)
Occupational Therapy Evaluation Patient Details Name: Deborah [name] MRN: [id number] DOB: 1955/02/05 Today's Date: 12/20/2022   History of Present Illness Pt is 67 yo presenting to [facility] with reports of L sided numbness that was present on awakening on 11/22. 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   Clinical Impression   At baseline, pt is Independent with all ADLs, IADLs, functional mobility without an AD and drives. 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 Pt now presents with L side neglect, visual deficits, B UE weakness, decreased B UE fine motor coordination, decreased L UE gross motor coordination, impaired L side sensation, decreased cognition, decreased balance during functional tasks, and decreased safety and independence with functional tasks. Pt currently requiring Supervision to Mod assist with cues for UB ADLs, Mod to Max assist with cues for LB ADLs, and Min to Mod assist with cues for functional transfers/mobility with a RW. Pt participated well in session and is highly motivated to return to baseline PLOF and has a good support system. Pt will benefit from acute skilled OT services to address deficits outlined below and increase safety and independence with functional tasks. Post acute discharge, pt will benefit from intensive inpatient skilled rehab services > 3 hours per day to maximize rehab potential.       If plan is discharge home, recommend the following: A little help with walking and/or transfers;A lot of help with bathing/dressing/bathroom;Assistance with cooking/housework;Assistance with feeding;Direct supervision/assist for medications management;Direct supervision/assist for financial management;Assist for transportation;Help with stairs or ramp for entrance;Supervision due to cognitive status    Functional Status  Assessment  Patient has had a recent decline in their functional status and demonstrates the ability to make significant improvements in function in a reasonable and predictable amount of time.  Equipment Recommendations  Other (comment) (defer to next level of care)    Recommendations for Other Services Rehab consult     Precautions / Restrictions Precautions Precautions: Fall Restrictions Weight Bearing Restrictions: No      Mobility Bed Mobility Overal bed mobility: Needs Assistance Bed Mobility: Supine to Sit, Sit to Supine     Supine to sit: Min assist, HOB elevated, Used rails (with increased time and increased effort; with cues for initiation, sequencing, and technique) Sit to supine: Min assist (with increased time and increased effort; with cues for initiation, sequencing, and technique)        Transfers Overall transfer level: Needs assistance Equipment used: Rolling walker (2 wheels) Transfers: Sit to/from Stand, Bed to chair/wheelchair/BSC Sit to Stand: Mod assist (to power up; with cues for hand placement/technique)     Step pivot transfers: Min assist (with use of RW and cues for attention to L side, initiation, safety, sequencing, and navigating in room)            Balance Overall balance assessment: Needs assistance Sitting-balance support: Single extremity supported, Feet supported, Bilateral upper extremity supported, No upper extremity supported Sitting balance-Leahy Scale: Fair (Initially Poor upon first sitting at EOB then progressing to Fair)   Postural control: Left lateral lean (mild, able to self correct with cues) Standing balance support: Bilateral upper extremity supported, During functional activity, Reliant on assistive device for balance Standing balance-Leahy Scale: Poor  ADL either performed or assessed with clinical judgement   ADL Overall ADL's : Needs assistance/impaired Eating/Feeding:  Supervision/ safety;Set up;Cueing for compensatory techinques;Sitting Eating/Feeding Details (indicate cue type and reason): Cues for locating items on Left side of tray. Cues to use L UE to assist with opening packages. Grooming: Moderate assistance;Sitting;Cueing for sequencing;Cueing for compensatory techniques Grooming Details (indicate cue type and reason): Cues for locating items on Left side. Cues for use of L UE during task. Cues for initiation and for maintaining attention to task. Upper Body Bathing: Moderate assistance;Cueing for sequencing;Cueing for compensatory techniques;Sitting Upper Body Bathing Details (indicate cue type and reason): Cues for locating items on Left side. Cues for use of L UE during task. Cues for initiation and for maintaining attention to task. Lower Body Bathing: Moderate assistance;Maximal assistance;Sit to/from stand;Sitting/lateral leans;Cueing for safety;Cueing for compensatory techniques;Cueing for sequencing Lower Body Bathing Details (indicate cue type and reason): Cues for locating items on Left side. Cues for use of L UE during task. Cues for initiation and for maintaining attention to task. Upper Body Dressing : Minimal assistance;Moderate assistance;Cueing for sequencing;Cueing for compensatory techniques;Sitting Upper Body Dressing Details (indicate cue type and reason): Cues for locating items on Left side. Cues for use of L UE during task. Cues for initiation. Lower Body Dressing: Maximal assistance;Sit to/from stand;Bed level;Cueing for sequencing;Cueing for safety;Cueing for compensatory techniques Lower Body Dressing Details (indicate cue type and reason): Cues for locating items on Left side. Cues for use of L UE during task. Cues for initiation. Toilet Transfer: Minimal assistance;Cueing for safety;Cueing for sequencing;BSC/3in1;Rolling walker (2 wheels) (simulated) Toilet Transfer Details (indicate cue type and reason): step-pivot transfer; Cues  for awareness of L side. Cues for initiation. Toileting- Clothing Manipulation and Hygiene: Moderate assistance;Maximal assistance;Cueing for safety;Cueing for sequencing;Cueing for compensatory techniques;Sitting/lateral lean;Sit to/from stand Toileting - Clothing Manipulation Details (indicate cue type and reason): Cues for awareness of L side. Cues for initiation.     Functional mobility during ADLs: Minimal assistance;Rolling walker (2 wheels);Cueing for safety;Cueing for sequencing (cues for attention to L side; cues for navigating around obsticles in the environment) General ADL Comments: Pt demonstrating excellent effort throughout session.     Vision Baseline Vision/History: 0 No visual deficits (Pt reports she has been referred to get glasses for distance vision but she has not yet gone to get eye exam due to insurance issues.) Ability to See in Adequate Light: 1 Impaired Patient Visual Report: No change from baseline (per pt report) Vision Assessment?: Yes;Vision impaired- to be further tested in functional context Eye Alignment: Within Functional Limits Ocular Range of Motion: Within Functional Limits Alignment/Gaze Preference: Chin down Tracking/Visual Pursuits: Requires cues, head turns, or add eye shifts to track;Decreased smoothness of horizontal tracking;Decreased smoothness of vertical tracking Saccades: Additional head turns occurred during testing;Other (comment) (Undershoots when reaching with Left UE and WFL when reaching with Right UE) Convergence: Impaired (comment) (delayed) Visual Fields: Right visual field deficit;Left visual field deficit (worse on Left) Depth Perception: Undershoots (Undershoots when reaching with Left UE and WFL when reaching with Right UE) Additional Comments: Pt unaware of visual deficits. Pt requiring cues to locate items of tray and in environment during functional mobility/transfers.     Perception         Praxis Praxis: Impaired Praxis  Impairment Details: Initiation, Motor planning, Organization Praxis-Other Comments: Requires increased time for motor planning and cues for initiation.   Pertinent Vitals/Pain Pain Assessment Pain Assessment: Faces Faces Pain Scale: Hurts even more (0 at  rest; 6 in L LE with movement) Pain Location: L LE Pain Descriptors / Indicators: Aching, Discomfort, Grimacing, Guarding Pain Intervention(s): Limited activity within patient's tolerance, Monitored during session, Repositioned     Extremity/Trunk Assessment Upper Extremity Assessment Upper Extremity Assessment: Right hand dominant;LUE deficits/detail;RUE deficits/detail;Generalized weakness RUE Deficits / Details: generalized weakness; decreased fine motor coordination RUE Sensation: WNL RUE Coordination: decreased fine motor LUE Deficits / Details: generalized weakness; decreased AROM shoulder shoulder flexion; PROM shoulder flexion WFL but with pt reporting soreness; decreased propreoception; decreased fine and gross motor coordinaiton; L side neglect LUE Sensation: decreased light touch;decreased proprioception LUE Coordination: decreased fine motor;decreased gross motor   Lower Extremity Assessment Lower Extremity Assessment: Defer to PT evaluation   Cervical / Trunk Assessment Cervical / Trunk Assessment: Normal   Communication Communication Communication: No apparent difficulties   Cognition Arousal: Alert Behavior During Therapy: WFL for tasks assessed/performed Overall Cognitive Status: Impaired/Different from baseline Area of Impairment: Attention, Memory, Following commands, Safety/judgement, Awareness, Problem solving                   Current Attention Level: Sustained Memory: Decreased short-term memory Following Commands: Follows one step commands consistently, Follows one step commands with increased time, Follows multi-step commands inconsistently, Follows multi-step commands with increased  time Safety/Judgement: Decreased awareness of safety, Decreased awareness of deficits Awareness: Emergent Problem Solving: Slow processing, Decreased initiation, Difficulty sequencing, Requires verbal cues General Comments: AAOx4 and pleasant throughout session.     General Comments       Exercises     Shoulder Instructions      Home Living Family/patient expects to be discharged to:: Private residence Living Arrangements: Children;Other (Comment) (grandchildren; pt's children are all adults and some grandchilren are adults and some are minors) Available Help at Discharge: Family;Available 24 hours/day;Friend(s);Neighbor Type of Home: House Home Access: Level entry     Home Layout: One level     Bathroom Shower/Tub: Chief Strategy Officer: Handicapped height Bathroom Accessibility: Yes How Accessible: Accessible via walker Home Equipment: Hand held shower head   Additional Comments: 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      Prior Functioning/Environment Prior Level of Function : Independent/Modified Independent             Mobility Comments: Independent without an AD ADLs Comments: Independent with ADLs, IADLs, and drives        OT Problem List: Decreased strength;Decreased range of motion;Decreased activity tolerance;Impaired balance (sitting and/or standing);Impaired vision/perception;Decreased coordination;Decreased cognition;Decreased safety awareness;Decreased knowledge of use of DME or AE;Impaired sensation;Impaired UE functional use      OT Treatment/Interventions: Self-care/ADL training;Therapeutic exercise;DME and/or AE instruction;Therapeutic activities;Cognitive remediation/compensation;Visual/perceptual remediation/compensation;Patient/family education;Balance training    OT Goals(Current goals can be found in the care plan section) Acute Rehab OT Goals Patient Stated Goal: to recover fully, be independent, and continue to  care for her children and grandchildren OT Goal Formulation: With patient Time For Goal Achievement: 01/03/23 Potential to Achieve Goals: Good ADL Goals Pt Will Perform Grooming: with supervision;standing (with pt demonstrating use of B UE and attention to Left side during tasks) Pt Will Perform Upper Body Bathing: with contact guard assist;sitting (with pt demonstrating use of B UE and attention to Left side during task) Pt Will Perform Lower Body Dressing: with min assist;sitting/lateral leans;sit to/from stand (with pt demonstrating use of B UE and attention to Left side during task) Pt Will Transfer to Toilet: with supervision;ambulating;regular height toilet;grab bars (with least restrictive AD and  demonstrating awareness of L side during transfer) Pt Will Perform Toileting - Clothing Manipulation and hygiene: with min assist;sitting/lateral leans;sit to/from stand Additional ADL Goal #1: Patient will demonstrate ability to locate and retrieve items needed for grooming tasks in her room with Supervision while using least restrictive AD.  OT Frequency: Min 1X/week    Co-evaluation              AM-PAC OT "6 Clicks" Daily Activity     Outcome Measure Help from another person eating meals?: A Little Help from another person taking care of personal grooming?: A Lot Help from another person toileting, which includes using toliet, bedpan, or urinal?: A Lot Help from another person bathing (including washing, rinsing, drying)?: A Lot Help from another person to put on and taking off regular upper body clothing?: A Little Help from another person to put on and taking off regular lower body clothing?: A Lot 6 Click Score: 14   End of Session Equipment Utilized During Treatment: Rolling walker (2 wheels) Nurse Communication: Mobility status;Other (comment) (L side neglect; pt requires cues)  Activity Tolerance: Patient tolerated treatment well Patient left: in bed;with call bell/phone  within reach;with bed alarm set  OT Visit Diagnosis: Unsteadiness on feet (R26.81);Other abnormalities of gait and mobility (R26.89);Muscle weakness (generalized) (M62.81);Ataxia, unspecified (R27.0);Other symptoms and signs involving cognitive function                Time: 3086-5784 OT Time Calculation (min): 50 min Charges:  OT General Charges $OT Visit: 1 Visit OT Evaluation $OT Eval Moderate Complexity: 1 Mod OT Treatments $Self Care/Home Management : 8-22 mins $Therapeutic Activity: 8-22 mins  Kajuan Guyton "Orson Eva., OTR/L, MA Acute Rehab [phone]   Lendon Colonel 12/20/2022, 2:18 PM

## 2022-12-20 NOTE — Consult Note (Signed)
Hospital Consult    Reason for Consult: Symptomatic right ICA stenosis Requesting Physician: Hospital medicine MRN #:  [id number]  History of Present Illness: This is a 67 y.o. female who presented yesterday evening with new onset left-sided numbness and tingling with associated weakness.  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  On exam, Deborah [name] was doing better.  She noted that the weakness she initially experienced in her left hand and foot have improved albeit there is still some amount of deficit.  She has also had improvement in some of the numbness she has appreciated.  Deborah [name] has a history of stroke, however per her, the last one was several years ago.  At 63, she remains very active.  She is the primary caregiver the 8 children 4 of which were her daughter's who passed away.  She has 2 adopted children, and 2 of her other daughters children also live with her. She is very motivated to giving them a loving home, and attends therapy as a family every Tuesday.  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   Diabetes mellitus (HCC)    A1C 6.5% on 03/20/20   GERD (gastroesophageal reflux disease)    Hypertension    Stroke [facility])     Past Surgical History:  Procedure Laterality Date   BREAST SURGERY      Allergies  Allergen Reactions   Levofloxacin Hives   Lisinopril Swelling    Swelling of face Had facial swelling 10/06/13 which improved with OTC Benadryl.  Recurred 10/10/13, took OTC Benadryl, and was further evaluated at PCP office 10/11/13 for continued swelling of lips and face. She was treated with Kenalog in office and Benadryl and Medrol dosepak as outpatient.    Aleve  [Naproxen]    Capsaicin-Menthol Hives   Ibuprofen Hives   Shellfish Allergy Hives    Prior to Admission medications   Medication Sig Start Date End Date Taking? Authorizing Provider  acetaminophen (TYLENOL) 325 MG tablet Take 2 tablets (650 mg total) by mouth every 4 (four) hours as needed for mild pain (or temp > 37.5 C (99.5 F)). 03/28/20   Olivencia-Simmons, Deforest Hoyles, NP  aspirin EC 81 MG EC tablet Take 1 tablet (81 mg total) by mouth daily. Swallow whole. 03/28/20   Olivencia-Simmons, Deforest Hoyles, NP  atorvastatin (LIPITOR) 40 MG tablet Take 1 tablet (40 mg total) by mouth daily. 03/28/20   Olivencia-Simmons, Deforest Hoyles, NP  bisacodyl (DULCOLAX) 10 MG suppository Place 1 suppository (10 mg total) rectally daily as needed for moderate constipation. 03/28/20   Olivencia-Simmons, Deforest Hoyles, NP  chlorthalidone (HYGROTON) 25 MG tablet Take 25 mg by mouth daily. 11/11/19   [provider]  clopidogrel (PLAVIX) 75 MG tablet Take 1 tablet (75 mg total) by mouth daily. 03/28/20   Olivencia-Simmons, Deforest Hoyles, NP  enoxaparin (LOVENOX) 40 MG/0.4ML injection Inject 0.4 mLs (40 mg total) into the skin daily. 03/28/20 04/27/20  Olivencia-Simmons, Deforest Hoyles, NP  ezetimibe (ZETIA) 10 MG tablet Take 10 mg by mouth daily. 11/11/19   [provider]  ferrous sulfate 325 (65 FE) MG tablet Take 325 mg by mouth every other day. 11/11/19   [provider]  gabapentin (NEURONTIN) 600 MG tablet Take 0.5 tablets (300 mg total) by  mouth 2 (two) times daily. 03/28/20 04/27/20  Olivencia-Simmons, Deforest Hoyles, NP  metFORMIN (GLUCOPHAGE) 500 MG tablet Take 500 mg by mouth daily with breakfast. 02/01/19   [provider]  metoprolol succinate (TOPROL-XL) 25 MG 24 hr tablet Take 1 tablet (25 mg total) by mouth daily. 03/28/20 04/27/20  Olivencia-Simmons, Deforest Hoyles, NP  pantoprazole (PROTONIX) 40 MG tablet Take 40 mg by mouth daily.    [provider]    Social History   Socioeconomic History   Marital status:  Single    Spouse name: Not on file   Number of children: Not on file   Years of education: Not on file   Highest education level: Not on file  Occupational History   Not on file  Tobacco Use   Smoking status: Never   Smokeless tobacco: Never  Vaping Use   Vaping status: Never Used  Substance and Sexual Activity   Alcohol use: No   Drug use: No   Sexual activity: Not on file  Other Topics Concern   Not on file  Social History Narrative   Not on file   Social Determinants of Health   Financial Resource Strain: Low Risk  (12/20/2022)   Overall Financial Resource Strain (CARDIA)    Difficulty of Paying Living Expenses: Not very hard  Food Insecurity: No Food Insecurity (12/20/2022)   Hunger Vital Sign    Worried About Running Out of Food in the Last Year: Never true    Ran Out of Food in the Last Year: Never true  Transportation Needs: No Transportation Needs (12/20/2022)   PRAPARE - Administrator, Civil Service (Medical): No    Lack of Transportation (Non-Medical): No  Physical Activity: Not on file  Stress: No Stress Concern Present (12/20/2022)   [employer] of Occupational Health - Occupational Stress Questionnaire    Feeling of Stress : Only a little  Social Connections: Moderately Integrated (12/20/2022)   Social Connection and Isolation Panel [NHANES]    Frequency of Communication with Friends and Family: More than three times a week    Frequency of Social Gatherings with Friends and Family: More than three times a week    Attends Religious Services: 1 to 4 times per year    Active Member of [employer] or Organizations: No    Attends Banker Meetings: Never    Marital Status: Living with partner  Intimate Partner Violence: Not At Risk (12/20/2022)   Humiliation, Afraid, Rape, and Kick questionnaire    Fear of Current or Ex-Partner: No    Emotionally Abused: No    Physically Abused: No    Sexually Abused: No   Family History  Problem  Relation Age of Onset   Breast cancer Mother    Pancreatic cancer Mother    Heart disease Mother    Heart disease Father    Breast cancer Sister     ROS: Otherwise negative unless mentioned in HPI  Physical Examination  Vitals:   12/20/22 0650 12/20/22 0755  BP: (!) 145/82 127/81  Pulse:  61  Resp:  18  Temp:  97.8 F (36.6 C)  SpO2:  98%   Body mass index is 35.45 kg/m.  General:  WDWN in NAD Gait: Not observed HENT: WNL, normocephalic Pulmonary: normal non-labored breathing, without Rales, rhonchi,  wheezing Cardiac: regular, without  Murmurs, rubs or gallops; without carotid bruits Abdomen: soft, NT/ND, no masses Skin: without rashes Vascular Exam/Pulses: 2+ radial bilaterally Extremities: without ischemic changes, without Gangrene ,  without cellulitis; without open wounds;  Musculoskeletal: no muscle wasting or atrophy  Neurologic: A&O X 3; a small amount of weakness in the left side as compared to the right.  Speech normal, fluent.  States that she continues to have some sensory deficits on the left including the arm and leg. Psychiatric:  The pt has Normal affect. Lymph:  Unremarkable  CBC    Component Value Date/Time   WBC 7.9 12/19/2022 1715   RBC 5.01 12/19/2022 1715   HGB 14.1 12/19/2022 1715   HCT 43.8 12/19/2022 1715   PLT 231 12/19/2022 1715   MCV 87.4 12/19/2022 1715   MCH 28.1 12/19/2022 1715   MCHC 32.2 12/19/2022 1715   RDW 15.7 (H) 12/19/2022 1715   LYMPHSABS 1.9 12/19/2022 1715   MONOABS 0.7 12/19/2022 1715   EOSABS 0.0 12/19/2022 1715   BASOSABS 0.0 12/19/2022 1715    BMET    Component Value Date/Time   NA 138 12/19/2022 1715   K 4.2 12/19/2022 1715   CL 103 12/19/2022 1715   CO2 25 12/19/2022 1715   GLUCOSE 142 (H) 12/19/2022 1715   BUN 15 12/19/2022 1715   CREATININE 0.94 12/19/2022 1715   CALCIUM 9.3 12/19/2022 1715   GFRNONAA >60 12/19/2022 1715   GFRAA >60 03/16/2019 1523    COAGS: Lab Results  Component Value Date    INR 0.9 12/19/2022   INR 1.0 03/20/2020      ASSESSMENT/PLAN: This is a 67 y.o. female with symptomatic right ICA stenosis.    Chantavia and I had a long discussion regarding the above.  She would benefit from right sided cerebrovascular revascularization.  She was offered both standard carotid endarterectomy versus transcarotid artery revascularization (TCAR).  We discussed that both carry a risk of stroke, however without surgery, the risk of stroke is 26% over the next 2 years.  After discussing the risks and benefits of both, Deborah [name] elected to proceed with TCAR.  My plan is to perform her surgery on Tuesday.  Please continue aspirin, Plavix, statin medications.    Victorino Sparrow MD [facility] [phone] 12/20/2022  10:48 AM
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
## 2022-12-20 NOTE — Consult Note (Signed)
NEUROLOGY CONSULT NOTE   Date of service: December 20, 2022 Patient Name: Deborah [name] MRN:  [id number] DOB:  [birth year] Chief Complaint: "Left-sided numbness" Requesting Provider: Lonia Blood, MD  History of Present Illness  Deborah [name] is a 67 y.o. 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   Diabetes mellitus (HCC)    A1C 6.5% on 03/20/20   GERD (gastroesophageal reflux disease)    Hypertension    Stroke [facility])     Past Surgical History:  Procedure Laterality Date   BREAST SURGERY      Family History: Family History  Problem Relation Age of Onset   Breast cancer Mother    Pancreatic cancer Mother    Heart disease Mother    Heart disease Father    Breast cancer Sister     Social History  reports that she has never smoked. She has never used smokeless tobacco. She reports that she does not drink alcohol and does not use drugs.  Allergies  Allergen Reactions   Levofloxacin Hives   Lisinopril Swelling    Swelling of face Had facial swelling 10/06/13 which improved with OTC Benadryl.  Recurred 10/10/13, took OTC Benadryl, and was further evaluated at PCP office 10/11/13 for continued swelling of lips and face. She was treated with Kenalog in office and Benadryl and Medrol dosepak as outpatient.    Aleve [Naproxen]    Capsaicin-Menthol Hives   Ibuprofen Hives   Shellfish Allergy Hives    Medications   Current Facility-Administered Medications:    [START ON 12/21/2022]  stroke:  early stages of recovery book, , Does not apply, Once, Julian Reil, Jared M, DO   0.9 %  sodium chloride infusion, , Intravenous, Continuous, Countryman, Chase, MD, Stopped at 12/19/22 2030   acetaminophen (TYLENOL) tablet 650 mg, 650 mg, Oral, Q4H PRN **OR** acetaminophen (TYLENOL) 160 MG/5ML solution 650 mg, 650 mg, Per Tube, Q4H PRN **OR** acetaminophen (TYLENOL) suppository 650 mg, 650 mg, Rectal, Q4H PRN, Julian Reil, Jared M, DO   aspirin EC tablet 81 mg, 81 mg, Oral, Daily, Julian Reil, Jared M, DO   clopidogrel (PLAVIX) tablet 75 mg, 75 mg, Oral, Daily, Julian Reil, Jared M, DO   enoxaparin (LOVENOX) injection 40 mg, 40 mg, Subcutaneous, Q24H, Gardner, Jared M, DO   insulin aspart (novoLOG) injection 0-15 Units, 0-15 Units, Subcutaneous, TID WC, Julian Reil, Jared M, DO   insulin aspart (novoLOG) injection 0-5 Units, 0-5 Units, Subcutaneous, QHS, Gardner, Jared M, DO   rosuvastatin (CRESTOR) tablet 40 mg, 40 mg, Oral, Daily, Hillary Bow, DO  Vitals   Vitals:   12/19/22 2215 12/19/22 2300 12/20/22 0351 12/20/22 0400  BP: (!) 149/83 123/89 (!) 145/78   Pulse: 74 69 63   Resp: 19 12 20    Temp:  98.1 F (36.7 C) 97.8 F (36.6 C)   TempSrc:  Oral Oral   SpO2: 98% 98% 99%   Weight:  85.1 kg  85.1 kg  Height:    5\' 1"  (1.549 m)    Body  mass index is 35.45 kg/m.  Physical Exam   Constitutional: Appears well-developed and well-nourished.  Neurologic Examination    Neuro: Mental Status: Patient is awake, alert, oriented to person, place, month, year, and situation. Patient is able to give a clear and coherent history. No signs of aphasia or neglect Cranial Nerves: II: Visual Fields are full. Pupils are equal, round, and reactive to light.   III,IV, VI: EOMI without ptosis or diploplia.  V: Facial sensation is symmetric to temperature VII: Facial movement with very subtle lag on the left side of her face VIII: hearing is intact to voice X: Uvula elevates symmetrically XII: tongue is  midline without atrophy or fasciculations.  Motor: Tone is normal. Bulk is normal. 5/5 strength was present on the right, she has 4/5 strength of the left arm and leg Sensory: Sensation is diminished in the small area of the left thigh Cerebellar: No clear ataxia finger-nose-finger on the right    Labs/Imaging/Neurodiagnostic studies   CBC:  Recent Labs  Lab Jan 11, 2023 1715  WBC 7.9  NEUTROABS 5.2  HGB 14.1  HCT 43.8  MCV 87.4  PLT 231   Basic Metabolic Panel:  Lab Results  Component Value Date   NA 138 01-11-2023   K 4.2 January 11, 2023   CO2 25 01-11-23   GLUCOSE 142 (H) Jan 11, 2023   BUN 15 2023-01-11   CREATININE 0.94 January 11, 2023   CALCIUM 9.3 Jan 11, 2023   GFRNONAA >60 01-11-23   GFRAA >60 03/16/2019   Lipid Panel:  Lab Results  Component Value Date   LDLCALC 183 (H) 01-11-23   HgbA1c:  Lab Results  Component Value Date   HGBA1C 6.9 (H) 01-11-2023   Urine Drug Screen:     Component Value Date/Time   LABOPIA NONE DETECTED 03/20/2020 0102   COCAINSCRNUR NONE DETECTED 03/20/2020 0102   LABBENZ NONE DETECTED 03/20/2020 0102   AMPHETMU NONE DETECTED 03/20/2020 0102   THCU NONE DETECTED 03/20/2020 0102   LABBARB NONE DETECTED 03/20/2020 0102    Alcohol Level     Component Value Date/Time   ETH <10 03/20/2020 0102   INR  Lab Results  Component Value Date   INR 0.9 11-Jan-2023   APTT  Lab Results  Component Value Date   APTT 23 (L) 01-11-2023   AED levels: No results found for: "PHENYTOIN", "ZONISAMIDE", "LAMOTRIGINE", "LEVETIRACETA"   MRI Brain(Personally reviewed): Extensive right hemispheric strokes, predominantly in a watershed distribution but also with other nonwatershed strokes.   ASSESSMENT   Deborah [name] is a 67 y.o. 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  She presents today with embolic appearing strokes in the right hemisphere, though also looks like a watershed  distribution as well.  I wonder if she had transient occlusion of her internal carotid artery that subsequently moved distally.  She will need further workup of embolic phenomena, but I would also consider her carotid stenosis as a possible etiology.  She did have a previous positive lupus anticoagulant, but this was felt to be a false positive.  RECOMMENDATIONS  - HgbA1c, fasting lipid panel - Frequent neuro checks - Echocardiogram - Prophylactic therapy-Antiplatelet med: Aspirin - dose 81mg  and plavix 75mg  daily  - Risk factor modification - Telemetry monitoring - PT consult, OT consult, Speech consult -Vascular surgery consultation - Stroke team to follow  ______________________________________________________________________    Signed, Ritta Slot, MD [facility]

## 2022-12-20 NOTE — Progress Notes (Signed)
   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  Goals of Care:   Code Status: Full Code   DVT prophylaxis: enoxaparin (LOVENOX) injection 40 mg Start: 12/20/22 1000   Interim Hx: The patient was examined and interviewed by one of my partners earlier today.  Assessment & Plan:  Acute ischemic anterior circulation right brain CVA's -embolic versus watershed -Resultant  -MRI brain noted acute infarcts involving the right caudate, lentiform nucleus, right frontal, and right parietal cortex and white matter with no hemorrhage or mass effect as well as evidence of slow flow/occlusion of the extracranial left vertebral artery -CTa head and neck redemonstrated occlusion of the left vertebral artery just distal to its origin with unchanged 50% stenosis of the proximal right ICA at the bifurcation with no other intracranial large vessel occlusion -TTE pending -medical treatment: Aspirin plus Plavix -LDL 152 -A1c 6.9 -PT/OT/SLP -Allow for permissive hypertension -Vascular surgery evaluation of right carotid artery as clinically it is felt this is most likely significantly greater than 50% stenosis and the etiology of her strokes  Remote history of breast cancer Has been in remission for 11 years  Controlled DM2 A1c 6.9 -reasonably controlled at present  HTN Practice permissive hypertension for now  HLD LDL poorly controlled reportedly on Crestor -change to high-dose Lipitor   Family Communication: No family present at time of visit Disposition: Anticipate need for rehab   Objective: Blood pressure 127/81, pulse 61, temperature 97.8 F (36.6 C), temperature source  Oral, resp. rate 18, height 5\' 1"  (1.549 m), weight 85.1 kg, SpO2 98%.  Intake/Output Summary (Last 24 hours) at 12/20/2022 1022 Last data filed at 12/19/2022 2210 Gross per 24 hour  Intake 49.36 ml  Output --  Net 49.36 ml   Filed Weights   12/19/22 2300 12/20/22 0400  Weight: 85.1 kg 85.1 kg    Examination: The patient was examined and interviewed by one of my partners earlier today.  She  CBC: Recent Labs  Lab 12/19/22 1715  WBC 7.9  NEUTROABS 5.2  HGB 14.1  HCT 43.8  MCV 87.4  PLT 231   Basic Metabolic Panel: Recent Labs  Lab 12/19/22 1715  NA 138  K 4.2  CL 103  CO2 25  GLUCOSE 142*  BUN 15  CREATININE 0.94  CALCIUM 9.3   GFR: Estimated Creatinine Clearance: 59.1 mL/min (by C-G formula based on SCr of 0.94 mg/dL).   Scheduled Meds:  [START ON 12/21/2022]  stroke: early stages of recovery book   Does not apply Once   aspirin EC  81 mg Oral Daily   clopidogrel  75 mg Oral Daily   enoxaparin (LOVENOX) injection  40 mg Subcutaneous Q24H   insulin aspart  0-15 Units Subcutaneous TID WC   insulin aspart  0-5 Units Subcutaneous QHS   rosuvastatin  40 mg Oral Daily   Continuous Infusions:  sodium chloride Stopped (12/19/22 2030)     LOS: 0 days   Lonia Blood, MD [facility] Office  [phone] Pager - Text Page per Loretha Stapler  If 7PM-7AM, please contact night-coverage per Amion 12/20/2022, 10:22 AM

## 2022-12-20 NOTE — Assessment & Plan Note (Addendum)
Acute R sided stroke of anterior circulation.  Possibilities include embolic from the 50% stenosis of R ICA at origin.  Embolic from aortic atherosclerotic dz, or cardio embolic. Stroke pathway Tele monitor 2d echo PT/OT/SLP Cont statin ASA 81 + Plavix 75 Neuro consult Sounds like neuro going to talk with stroke team about getting vasc surg to evaluate the R ICA stenosis to see if that's the culprit. I think she probably could also do with some longer term outpt cardiac monitoring to make sure we aren't missing an undiagnosed PAF (no known h/o a.fib but pt does have HFrEF in setting of NICM and severely dilated L atrium in setting of mitral regurg). Defer to stroke team if she needs formal cards consult / TEE / ILR placement. Looks like she was on eliquis for a while following prior recurrent stroke in 2022 for a positive lupus anticoagulant test; however, looks like heme/onc at [facility] thought this might just be a false positive and ultimately they / neurology at [facility] ended up stopping the eliquis it appears

## 2022-12-20 NOTE — Assessment & Plan Note (Signed)
Remission for 11 years after treatment. No evidence of recurrence or suspicious lymph nodes on todays CT scans including CT C/A/P

## 2022-12-20 NOTE — Assessment & Plan Note (Addendum)
As of Aug 2023 echo: EF 40-45%, mod MR. Med rec pending Holding home BP meds in setting of acute ischemic stroke Continue statin Tele monitor

## 2022-12-20 NOTE — Progress Notes (Signed)
Inpatient Rehab Admissions Coordinator:   Per therapy recommendation,  patient was screened for CIR candidacy by Megan Salon, MS, CCC-SLP. At this time, Pt. Appears to be a a potential candidate for CIR. I will place   order for rehab consult per protocol for full assessment. Please contact me any with questions.  Megan Salon, MS, CCC-SLP Rehab Admissions Coordinator  [phone] (celll) [phone] (office)

## 2022-12-21 ENCOUNTER — Other Ambulatory Visit (HOSPITAL_COMMUNITY): Payer: 59

## 2022-12-21 DIAGNOSIS — I6389 Other cerebral infarction: Secondary | ICD-10-CM | POA: Diagnosis not present

## 2022-12-21 DIAGNOSIS — I639 Cerebral infarction, unspecified: Secondary | ICD-10-CM | POA: Diagnosis not present

## 2022-12-21 LAB — ECHOCARDIOGRAM COMPLETE
Area-P 1/2: 4.36 cm2
Height: 61 in
MV M vel: 5.05 m/s
MV Peak grad: 102 mm[Hg]
Radius: 0.5 cm
S' Lateral: 4.1 cm
Weight: 3001.78 [oz_av]

## 2022-12-21 LAB — RAPID URINE DRUG SCREEN, HOSP PERFORMED
Amphetamines: NOT DETECTED
Barbiturates: NOT DETECTED
Benzodiazepines: NOT DETECTED
Cocaine: NOT DETECTED
Opiates: NOT DETECTED
Tetrahydrocannabinol: NOT DETECTED

## 2022-12-21 LAB — GLUCOSE, CAPILLARY
Glucose-Capillary: 98 mg/dL (ref 70–99)
Glucose-Capillary: 114 mg/dL — ABNORMAL HIGH (ref 70–99)
Glucose-Capillary: 101 mg/dL — ABNORMAL HIGH (ref 70–99)
Glucose-Capillary: 116 mg/dL — ABNORMAL HIGH (ref 70–99)

## 2022-12-21 MED ORDER — CLOPIDOGREL BISULFATE 75 MG PO TABS
75.0000 mg | ORAL_TABLET | Freq: Every day | ORAL | Status: DC
  Administered 2022-12-22: 75 mg via ORAL
  Filled 2022-12-21: qty 1

## 2022-12-21 MED ORDER — TRAMADOL HCL 50 MG PO TABS
50.0000 mg | ORAL_TABLET | Freq: Four times a day (QID) | ORAL | Status: DC | PRN
  Administered 2022-12-21 – 2022-12-28 (×5): 50 mg via ORAL
  Filled 2022-12-21 (×5): qty 1

## 2022-12-21 MED ORDER — CLOPIDOGREL BISULFATE 75 MG PO TABS
300.0000 mg | ORAL_TABLET | Freq: Once | ORAL | Status: AC
  Administered 2022-12-21: 300 mg via ORAL
  Filled 2022-12-21: qty 4

## 2022-12-21 NOTE — Evaluation (Signed)
Speech Language Pathology Evaluation Patient Details Name: Deborah [name] MRN: [id number] DOB: 12/04/1955 Today's Date: 12/21/2022 Time: 4098-1191 SLP Time Calculation (min) (ACUTE ONLY): 21 min  Problem List:  Patient Active Problem List   Diagnosis Date Noted   NICM (nonischemic cardiomyopathy) (HCC) 12/20/2022   History of cancer 12/20/2022   CVA (cerebral vascular accident) (HCC) 12/20/2022   Acute ischemic stroke (HCC) 12/19/2022   Occlusion and stenosis of vertebral artery 03/28/2020   Spinal cord ischemia (HCC)    Controlled type 2 diabetes mellitus with hyperglycemia (HCC)    Essential hypertension    History of CVA (cerebrovascular accident)    Bradycardia    Vertebral artery dissection (HCC) 03/20/2020   Fever 11/27/2012   Dehydration 11/27/2012   Lower urinary tract infectious disease 11/27/2012   Past Medical History:  Past Medical History:  Diagnosis Date   Cancer (HCC) 2013   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   Diabetes mellitus (HCC)    A1C 6.5% on 03/20/20   GERD (gastroesophageal reflux disease)    Hypertension    Stroke [facility])    Past Surgical History:  Past Surgical History:  Procedure Laterality Date   BREAST SURGERY     HPI:  Deborah [name] is a 67 yo female presenting to ED 11/23 with L sided numbness. MRI Brain with acute infarcts involving the R caudate, lentiform nucleus as well as R frontal and parietal cortex. CTA with R ICA stenosis. Previously seen by SLP 02/2020 and scored 25/30 on the SLUMS. PMH includes T2DM, HTN, breast cancer in remission, prior CVA, GERD   Assessment / Plan / Recommendation Clinical Impression  Pt reports being independent with all finances and medications at home, where she lives with her eight grandchildren (ages 6-28). She scored 15/30 on the SLUMS, which is below the score of 27 or  above that is considered WFL and significantly below previous SLP evaluation, during which she scored 25/30. Pt presents with deficits related to sustained attention, awareness, memory (retrieval > storage), and problem solving. Pt able to name 13 animals during a one-minute divergent naming activity, although note she was frequently repetitive and named one animal up to four times. She has very limited awareness of her deficits and into the assistance she may require due to her CLOF. She had significant difficutly with the clock drawing task, adding the numbers in the correct order but with deficits related to planning and setting the time digitally after drawing the hands seemingly randomly. Additionally, she presents with a flat affect and poor eye contact. SLP provided education and recommendations for ongoing intensive SLP f/u, in which pt states she does not wish to participate. Will continue attempts to f/u to target these acute deficits.    SLP Assessment  SLP Recommendation/Assessment: Patient needs continued Speech Lanaguage Pathology Services SLP Visit Diagnosis: Cognitive communication deficit (R41.841)    Recommendations for follow up therapy are one component of a multi-disciplinary discharge planning process, led by the attending physician.  Recommendations may be updated based on patient status, additional functional criteria and insurance authorization.    Follow Up Recommendations  Acute inpatient rehab (3hours/day)    Assistance Recommended at Discharge  Frequent or constant Supervision/Assistance  Functional Status Assessment Patient has had a recent decline in their functional status and demonstrates the ability to make significant improvements in function in a reasonable and predictable  amount of time.  Frequency and Duration min 2x/week  2 weeks      SLP Evaluation Cognition  Overall Cognitive Status: Impaired/Different from baseline Arousal/Alertness:  Awake/alert Orientation Level: Oriented X4 Attention: Sustained Sustained Attention: Impaired Sustained Attention Impairment: Verbal basic Memory: Impaired Memory Impairment: Retrieval deficit Awareness: Impaired Awareness Impairment: Intellectual impairment Problem Solving: Impaired Problem Solving Impairment: Verbal basic       Comprehension  Auditory Comprehension Overall Auditory Comprehension: Appears within functional limits for tasks assessed    Expression Expression Primary Mode of Expression: Verbal Verbal Expression Overall Verbal Expression: Impaired Initiation: No impairment Pragmatics: Impairment Impairments: Abnormal affect;Eye contact;Monotone Written Expression Dominant Hand: Right   Oral / Motor  Oral Motor/Sensory Function Overall Oral Motor/Sensory Function: Within functional limits Motor Speech Overall Motor Speech: Appears within functional limits for tasks assessed            Deborah [name], M.A., CF-SLP Speech Language Pathology, Acute Rehabilitation Services  Secure Chat preferred [phone]  12/21/2022, 3:11 PM
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
## 2022-12-21 NOTE — Progress Notes (Signed)
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  Goals of Care:   Code Status: Full Code   DVT prophylaxis: enoxaparin (LOVENOX) injection 40 mg Start: 12/20/22 1000  Interim Hx: No acute events reported overnight.  Afebrile.  Vital signs stable.  Feels stable overall.  Reports that she understands her upcoming procedure and agrees to proceed.  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 just distal to its origin with unchanged 50% stenosis of the proximal right ICA at the bifurcation with no other intracranial large vessel occlusion -TTE notes EF 45-50% with global hypokinesis and mild concentric left ventricular hypertrophy and normal RV with no obvious intracardiac source of embolization -medical treatment: Aspirin plus Plavix -LDL 152 -A1c 6.9 -PT/OT/SLP -Vascular surgery evaluation of right carotid artery as clinically it is felt this is most likely significantly greater than 50% stenosis and the etiology of her strokes  Right carotid artery stenosis CTA head and neck this admission reported 50% right ICA stenosis but neurology feels this is more consistent with 65-70% to their interpretation, and clinically her situation is concerning for a carotid source of her  CVAs - Vascular Surgery following and patient to undergo TCAR on Tuesday  Remote history of breast cancer Has been in remission for 11 years  Controlled DM2 A1c 6.9 -CBG well-controlled at present  HTN Blood pressure goal 130-160  HLD LDL poorly controlled reportedly on Crestor - changed to high-dose Lipitor this admission -continue Zetia   Family Communication: No family present at time of visit Disposition: Anticipate possible need for rehab postprocedure   Objective: Blood pressure (!) 145/77, pulse 69, temperature 97.7 F (36.5 C), temperature source Oral, resp. rate 20, height 5\' 1"  (1.549 m), weight 85.1 kg, SpO2 95%.  Intake/Output Summary (Last 24 hours) at 12/21/2022 6010 Last data filed at 12/20/2022 1113 Gross per 24 hour  Intake --  Output 400 ml  Net -400 ml   Filed Weights   12/19/22 2300 12/20/22 0400  Weight: 85.1 kg 85.1 kg    Examination: General: No acute respiratory distress Lungs: Clear to auscultation bilaterally -no wheezing Cardiovascular: Regular rate and rhythm without murmur gallop or rub normal S1 and S2 Abdomen: Nontender, nondistended, soft, bowel sounds positive, no rebound, no ascites, no appreciable mass Extremities: No significant cyanosis, clubbing, or edema bilateral lower extremities   CBC: Recent Labs  Lab 12/19/22 1715  WBC 7.9  NEUTROABS 5.2  HGB 14.1  HCT 43.8  MCV 87.4  PLT 231   Basic Metabolic Panel: Recent Labs  Lab 12/19/22 1715  NA 138  K 4.2  CL 103  CO2 25  GLUCOSE 142*  BUN 15  CREATININE 0.94  CALCIUM 9.3   GFR: Estimated Creatinine Clearance: 59.1 mL/min (by C-G formula based on SCr of 0.94 mg/dL).   Scheduled Meds:  aspirin  EC  81 mg Oral Daily   atorvastatin  80 mg Oral Daily   clopidogrel  300 mg Oral Once   [START ON 12/22/2022] clopidogrel  75 mg Oral Daily   enoxaparin (LOVENOX) injection  40 mg Subcutaneous Q24H   ezetimibe  10 mg Oral Daily   insulin aspart  0-15 Units  Subcutaneous TID WC   insulin aspart  0-5 Units Subcutaneous QHS   lidocaine  1 patch Transdermal Q24H     LOS: 1 day   Lonia Blood, MD [facility] Office  [phone] Pager - Text Page per Amion  If 7PM-7AM, please contact night-coverage per Amion 12/21/2022, 9:22 AM

## 2022-12-22 DIAGNOSIS — R079 Chest pain, unspecified: Secondary | ICD-10-CM | POA: Diagnosis not present

## 2022-12-22 DIAGNOSIS — I6521 Occlusion and stenosis of right carotid artery: Secondary | ICD-10-CM | POA: Diagnosis not present

## 2022-12-22 DIAGNOSIS — Z7902 Long term (current) use of antithrombotics/antiplatelets: Secondary | ICD-10-CM | POA: Diagnosis not present

## 2022-12-22 DIAGNOSIS — Z7982 Long term (current) use of aspirin: Secondary | ICD-10-CM | POA: Diagnosis not present

## 2022-12-22 DIAGNOSIS — I639 Cerebral infarction, unspecified: Secondary | ICD-10-CM | POA: Diagnosis not present

## 2022-12-22 LAB — COMPREHENSIVE METABOLIC PANEL
ALT: 24 U/L (ref 0–44)
AST: 25 U/L (ref 15–41)
Albumin: 3 g/dL — ABNORMAL LOW (ref 3.5–5.0)
Alkaline Phosphatase: 74 U/L (ref 38–126)
Anion gap: 8 (ref 5–15)
BUN: 12 mg/dL (ref 8–23)
CO2: 23 mmol/L (ref 22–32)
Calcium: 8.7 mg/dL — ABNORMAL LOW (ref 8.9–10.3)
Chloride: 102 mmol/L (ref 98–111)
Creatinine, Ser: 0.77 mg/dL (ref 0.44–1.00)
GFR, Estimated: >60 mL/min (ref >60)
Glucose, Bld: 107 mg/dL — ABNORMAL HIGH (ref 70–99)
Potassium: 3.6 mmol/L (ref 3.5–5.1)
Sodium: 133 mmol/L — ABNORMAL LOW (ref 135–145)
Total Bilirubin: 0.8 mg/dL (ref <1.2)
Total Protein: 6.9 g/dL (ref 6.5–8.1)

## 2022-12-22 LAB — CBC
HCT: 36.8 % (ref 36.0–46.0)
Hemoglobin: 12.1 g/dL (ref 12.0–15.0)
MCH: 28.7 pg (ref 26.0–34.0)
MCHC: 32.9 g/dL (ref 30.0–36.0)
MCV: 87.2 fL (ref 80.0–100.0)
Platelets: 195 10*3/uL (ref 150–400)
RBC: 4.22 MIL/uL (ref 3.87–5.11)
RDW: 15.5 % (ref 11.5–15.5)
WBC: 6.3 10*3/uL (ref 4.0–10.5)
nRBC: 0 % (ref 0.0–0.2)

## 2022-12-22 LAB — TYPE AND SCREEN
ABO/RH(D): O POS
Antibody Screen: NEGATIVE

## 2022-12-22 LAB — GLUCOSE, CAPILLARY
Glucose-Capillary: 91 mg/dL (ref 70–99)
Glucose-Capillary: 94 mg/dL (ref 70–99)
Glucose-Capillary: 117 mg/dL — ABNORMAL HIGH (ref 70–99)
Glucose-Capillary: 85 mg/dL (ref 70–99)
Glucose-Capillary: 138 mg/dL — ABNORMAL HIGH (ref 70–99)

## 2022-12-22 LAB — TROPONIN I (HIGH SENSITIVITY)
Troponin I (High Sensitivity): 10 ng/L (ref <18)
Troponin I (High Sensitivity): 10 ng/L (ref <18)

## 2022-12-22 LAB — SURGICAL PCR SCREEN
MRSA, PCR: NEGATIVE
Staphylococcus aureus: NEGATIVE

## 2022-12-22 LAB — MAGNESIUM: Magnesium: 1.8 mg/dL (ref 1.7–2.4)

## 2022-12-22 LAB — ABO/RH: ABO/RH(D): O POS

## 2022-12-22 MED ORDER — ALUM & MAG HYDROXIDE-SIMETH 200-200-20 MG/5ML PO SUSP
30.0000 mL | Freq: Once | ORAL | Status: AC
Start: 2022-12-22 — End: 2022-12-22
  Administered 2022-12-22: 30 mL via ORAL
  Filled 2022-12-22: qty 30

## 2022-12-22 MED ORDER — CEFAZOLIN SODIUM-DEXTROSE 2-4 GM/100ML-% IV SOLN
2.0000 g | INTRAVENOUS | Status: AC
  Administered 2022-12-23: 2 g via INTRAVENOUS
  Filled 2022-12-22: qty 100

## 2022-12-22 MED ORDER — ONDANSETRON HCL 4 MG/2ML IJ SOLN: 4.0000 mg | Freq: Four times a day (QID) | INTRAMUSCULAR | Status: DC | PRN

## 2022-12-22 MED ORDER — HYOSCYAMINE SULFATE 0.125 MG SL SUBL
0.2500 mg | SUBLINGUAL_TABLET | Freq: Once | SUBLINGUAL | Status: AC
  Administered 2022-12-22: 0.25 mg via SUBLINGUAL
  Filled 2022-12-22: qty 2

## 2022-12-22 NOTE — Progress Notes (Signed)
Inpatient Rehab Admissions Coordinator:     I met with Pt. To discuss potential CIR admit. She is interested and states  her grandchildren are always home with her and can assist at d/c. I will call her granddaughter to confirm. TCAR planned for tomorrow. I will follow up with her and likely send case to insurance once that is completed.  Megan Salon, MS, CCC-SLP Rehab Admissions Coordinator  [phone] (celll) [phone] (office)

## 2022-12-22 NOTE — Plan of Care (Signed)
Patient has been up to chair for sometime today. Patient is aware of surgery tomorrow. She grateful and excited. Will continue to monitor patient.

## 2022-12-22 NOTE — Anesthesia Preprocedure Evaluation (Signed)
Anesthesia Evaluation  Patient identified by MRN, date of birth, ID band Patient awake    Reviewed: Allergy & Precautions, H&P , NPO status , Patient's Chart, lab work & pertinent test results  Airway Mallampati: II  TM Distance: >3 FB Neck ROM: Full    Dental no notable dental hx.    Pulmonary neg pulmonary ROS   Pulmonary exam normal breath sounds clear to auscultation       Cardiovascular hypertension, Normal cardiovascular exam Rhythm:Regular Rate:Normal  Carotid stenosis   TTE:  1. Left ventricular ejection fraction, by estimation, is 45 to 50%. The  left ventricle has mildly decreased function. The left ventricle  demonstrates global hypokinesis. There is mild concentric left ventricular  hypertrophy. Left ventricular diastolic  parameters are indeterminate.   2. Right ventricular systolic function is normal. The right ventricular  size is normal.   3. The mitral valve is normal in structure. Moderate mitral valve  regurgitation. No evidence of mitral stenosis.   4. Tricuspid valve regurgitation is moderate.   5. The aortic valve is normal in structure. Aortic valve regurgitation is  not visualized. No aortic stenosis is present.   6. The inferior vena cava is normal in size with greater than 50%     Neuro/Psych CVA  negative psych ROS   GI/Hepatic Neg liver ROS,GERD  ,,  Endo/Other  diabetes, Type 2    Renal/GU negative Renal ROS  negative genitourinary   Musculoskeletal negative musculoskeletal ROS (+)    Abdominal   Peds negative pediatric ROS (+)  Hematology negative hematology ROS (+)   Anesthesia Other Findings   Reproductive/Obstetrics negative OB ROS                             Anesthesia Physical Anesthesia Plan  ASA: 3  Anesthesia Plan: General   Post-op Pain Management:    Induction: Intravenous  PONV Risk Score and Plan: Ondansetron, Dexamethasone,  Midazolam and Treatment may vary due to age or medical condition  Airway Management Planned: Oral ETT  Additional Equipment: Arterial line  Intra-op Plan:   Post-operative Plan: Extubation in OR  Informed Consent: I have reviewed the patients History and Physical, chart, labs and discussed the procedure including the risks, benefits and alternatives for the proposed anesthesia with the patient or authorized representative who has indicated his/her understanding and acceptance.     Dental advisory given  Plan Discussed with: CRNA  Anesthesia Plan Comments:         Anesthesia Quick Evaluation

## 2022-12-22 NOTE — Progress Notes (Addendum)
  Progress Note    12/22/2022 7:52 AM Hospital Day 2  Subjective:  says she is having some chest pain.  RN reports primary team has been notified.  EKG obtained and troponins ordered.   Afebrile   Vitals:   12/22/22 0647 12/22/22 0743  BP: (!) 151/85 (!) 155/96  Pulse:  70  Resp:  19  Temp:  98.1 F (36.7 C)  SpO2: 95%     Physical Exam: General:  no distress Lungs:  non labored   CBC    Component Value Date/Time   WBC 6.3 12/22/2022 0351   RBC 4.22 12/22/2022 0351   HGB 12.1 12/22/2022 0351   HCT 36.8 12/22/2022 0351   PLT 195 12/22/2022 0351   MCV 87.2 12/22/2022 0351   MCH 28.7 12/22/2022 0351   MCHC 32.9 12/22/2022 0351   RDW 15.5 12/22/2022 0351   LYMPHSABS 1.9 12/19/2022 1715   MONOABS 0.7 12/19/2022 1715   EOSABS 0.0 12/19/2022 1715   BASOSABS 0.0 12/19/2022 1715    BMET    Component Value Date/Time   NA 133 (L) 12/22/2022 0351   K 3.6 12/22/2022 0351   CL 102 12/22/2022 0351   CO2 23 12/22/2022 0351   GLUCOSE 107 (H) 12/22/2022 0351   BUN 12 12/22/2022 0351   CREATININE 0.77 12/22/2022 0351   CALCIUM 8.7 (L) 12/22/2022 0351   GFRNONAA >60 12/22/2022 0351   GFRAA >60 03/16/2019 1523    INR    Component Value Date/Time   INR 0.9 12/19/2022 1715     Intake/Output Summary (Last 24 hours) at 12/22/2022 0752 Last data filed at 12/21/2022 1614 Gross per 24 hour  Intake --  Output 300 ml  Net -300 ml     Assessment/Plan:  67 y.o. female with symptomatic right ICA stenosis  Hospital Day 2  -pt scheduled for right TCAR tomorrow.  Pt is currently having chest pain and work up in progress.  Pending that workup, will plan TCAR accordingly.  Discussed with pt pending outcome, surgery may be postponed.   -continue asa/statin/plavix   Doreatha Massed, PA-C [facility] [phone] 12/22/2022 7:52 AM   VASCULAR STAFF ADDENDUM: I have independently interviewed and examined the patient. I agree with the above.  Doing  well this afternoon. Workup negative. Discuss with McClung. Plan for surgery tomorrow.  Was aable to discuss surgery with her and her sister again today.  After discussing the risk and benefits, Angelia elected to proceed.    Victorino Sparrow MD [facility] Phone Number: [phone] 12/22/2022 6:25 PM

## 2022-12-22 NOTE — Consult Note (Signed)
Physical Medicine and Rehabilitation Consult Reason for Consult: Left-sided weakness and numbness Referring Physician: Sharon Seller   HPI: Karma Fletes is a 67 y.o. female with a history of prior stroke, breast cancer, diabetes, hypertension who presented on 12/19/2022 to [facility] with left-sided numbness.  CT of the head demonstrated no acute abnormality.  CTA of the head and neck revealed the right ICA stenosis of 65 to 70% and left vertebral artery occlusion.  Patient's MRI revealed a right caudate head, right ACA and right MCA/ACA watershed infarcts.  Neurology felt these were likely secondary to right ICA stenosis.  Patient was on aspirin and Plavix prior to admission which appear to have been continued for now.  Vascular surgery was consulted for symptomatic right ICA stenosis and right TCAR is scheduled for tomorrow tentatively although the patient did have some chest pain this morning.  Patient was seen by physical therapy today and transferred sit to stand with min assist and the patient walked 65 feet rolling walker at min to mod assist level.  He struggled with running into objects on the left and going outside the left edge of his walker.  Patient lives at home with his children in a 1 level house with level entry.  Patient was independent and driving prior to this hospitalization.   Review of Systems  Constitutional:  Negative for fever.  HENT:  Negative for hearing loss.   Eyes: Negative.   Respiratory:  Negative for cough.   Cardiovascular:  Positive for chest pain.  Gastrointestinal: Negative.   Genitourinary: Negative.   Musculoskeletal:  Negative for myalgias.  Skin:  Negative for rash.  Neurological:  Positive for dizziness, sensory change, focal weakness and weakness.  Psychiatric/Behavioral: Negative.     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   Diabetes mellitus (HCC)    A1C 6.5% on 03/20/20   GERD (gastroesophageal reflux disease)    Hypertension    Stroke [facility])    Past Surgical History:  Procedure Laterality Date   BREAST SURGERY     Family History  Problem Relation Age of Onset   Breast cancer Mother    Pancreatic cancer Mother    Heart disease Mother    Heart disease Father    Breast cancer Sister    Social History:  reports that she has never smoked. She has never used smokeless tobacco. She reports that she does not drink alcohol and does not use drugs. Allergies:  Allergies  Allergen Reactions   Levofloxacin Hives   Lisinopril Swelling    Swelling of face  Had facial swelling 10/06/13 which improved with OTC Benadryl.  Recurred 10/10/13, took OTC Benadryl, and was further evaluated at PCP office 10/11/13 for continued swelling of lips and face. She was treated with Kenalog in office and Benadryl and Medrol dosepak as outpatient.   Aleve [Naproxen]    Capsaicin-Menthol Hives   Ibuprofen Hives   Shellfish Allergy Hives   Medications Prior to Admission  Medication Sig Dispense Refill   acetaminophen (TYLENOL) 325 MG tablet Take 2 tablets (650 mg total) by mouth every 4 (four) hours as needed for mild pain (or temp > 37.5 C (99.5 F)). 30 tablet 0   aspirin EC 81 MG EC tablet Take 1 tablet (81 mg total)  by mouth daily. Swallow whole. 30 tablet 11   atorvastatin (LIPITOR) 40 MG tablet Take 1 tablet (40 mg total) by mouth daily. 30 tablet 3   chlorthalidone (HYGROTON) 25 MG tablet Take 25 mg by mouth daily.     Cholecalciferol 125 MCG (5000 UT) capsule Take 5,000 Units by mouth daily.     clopidogrel (PLAVIX) 75 MG tablet Take 1 tablet (75 mg total) by mouth daily. 15 tablet 0   cyanocobalamin (VITAMIN B12) 500 MCG tablet Take 500 mcg by mouth daily.     ezetimibe (ZETIA) 10 MG tablet Take 10 mg by mouth daily.     ferrous  sulfate 325 (65 FE) MG tablet Take 325 mg by mouth every other day.     gabapentin (NEURONTIN) 600 MG tablet Take 0.5 tablets (300 mg total) by mouth 2 (two) times daily. 30 tablet 0   losartan (COZAAR) 100 MG tablet Take 1 tablet by mouth daily.     metFORMIN (GLUCOPHAGE) 500 MG tablet Take 500 mg by mouth 2 (two) times daily with a meal.     enoxaparin (LOVENOX) 40 MG/0.4ML injection Inject 0.4 mLs (40 mg total) into the skin daily. 12 mL 0   metoprolol succinate (TOPROL-XL) 25 MG 24 hr tablet Take 1 tablet (25 mg total) by mouth daily. 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  Lives With: Other (Comment) (grandchildren)  Functional History: Prior Function Prior Level of Function : Independent/Modified Independent Mobility Comments: Independent without an AD ADLs Comments: Independent with ADLs, IADLs, and drives Functional Status:  Mobility: Bed Mobility Overal bed mobility: Needs Assistance Bed Mobility: Supine to Sit Supine to sit: Supervision Sit to supine: Min assist General bed mobility comments: Assist for safety. Incr time to perform Transfers Overall transfer level: Needs assistance Equipment used: Rolling walker (2 wheels) Transfers: Sit to/from Stand Sit to Stand: Min assist Bed to/from chair/wheelchair/BSC transfer type:: Step pivot Step pivot transfers:  (with use of RW and cues for attention to L side,  initiation, safety, sequencing, and navigating in room) General transfer comment: Assist to power up and stabilize. Verbal cues for hand placement Ambulation/Gait Ambulation/Gait assistance: Min assist, Mod assist Gait Distance (Feet): 65 Feet Assistive device: Rolling walker (2 wheels) Gait Pattern/deviations: Step-through pattern, Decreased step length - left, Decreased dorsiflexion - left, Drifts right/left General Gait Details: Assist for balance and support. Mod assist to manage walker with pt running into objects on left and on turns getting feet outside walker. Several instances of left foot hitting leg of walker. Gait velocity: decreased Gait velocity interpretation: <1.8 ft/sec, indicate of risk for recurrent falls    ADL: ADL Overall ADL's : Needs assistance/impaired Eating/Feeding: Supervision/ safety, Set up, Cueing for compensatory techinques, Sitting Eating/Feeding Details (indicate cue type and reason): Cues for locating items on Left side of tray. Cues to use L UE to assist with opening packages. Grooming: Moderate assistance, Sitting, Cueing for sequencing, Cueing for compensatory techniques Grooming Details (indicate cue type and reason): Cues for locating items on Left side. Cues  for use of L UE during task. Cues for initiation and for maintaining attention to task. Upper Body Bathing: Moderate assistance, Cueing for sequencing, Cueing for compensatory techniques, Sitting Upper Body Bathing Details (indicate cue type and reason): Cues for locating items on Left side. Cues for use of L UE during task. Cues for initiation and for maintaining attention to task. Lower Body Bathing: Moderate assistance, Maximal assistance, Sit to/from stand, Sitting/lateral leans, Cueing for safety, Cueing for compensatory techniques, Cueing for sequencing Lower Body Bathing Details (indicate cue type and reason): Cues for locating items on Left side. Cues for use of L UE during task. Cues for  initiation and for maintaining attention to task. Upper Body Dressing : Minimal assistance, Moderate assistance, Cueing for sequencing, Cueing for compensatory techniques, Sitting Upper Body Dressing Details (indicate cue type and reason): Cues for locating items on Left side. Cues for use of L UE during task. Cues for initiation. Lower Body Dressing: Maximal assistance, Sit to/from stand, Bed level, Cueing for sequencing, Cueing for safety, Cueing for compensatory techniques Lower Body Dressing Details (indicate cue type and reason): Cues for locating items on Left side. Cues for use of L UE during task. Cues for initiation. Toilet Transfer: Minimal assistance, Cueing for safety, Cueing for sequencing, BSC/3in1, Rolling walker (2 wheels) (simulated) Toilet Transfer Details (indicate cue type and reason): step-pivot transfer; Cues for awareness of L side. Cues for initiation. Toileting- Clothing Manipulation and Hygiene: Moderate assistance, Maximal assistance, Cueing for safety, Cueing for sequencing, Cueing for compensatory techniques, Sitting/lateral lean, Sit to/from stand Toileting - Clothing Manipulation Details (indicate cue type and reason): Cues for awareness of L side. Cues for initiation. Functional mobility during ADLs: Minimal assistance, Rolling walker (2 wheels), Cueing for safety, Cueing for sequencing (cues for attention to L side; cues for navigating around obsticles in the environment) General ADL Comments: Pt demonstrating excellent effort throughout session.  Cognition: Cognition Overall Cognitive Status: Impaired/Different from baseline Arousal/Alertness: Awake/alert Orientation Level: Oriented X4 Attention: Sustained Sustained Attention: Impaired Sustained Attention Impairment: Verbal basic Memory: Impaired Memory Impairment: Retrieval deficit Awareness: Impaired Awareness Impairment: Intellectual impairment Problem Solving: Impaired Problem Solving Impairment: Verbal  basic Cognition Arousal: Alert Behavior During Therapy: WFL for tasks assessed/performed Overall Cognitive Status: Impaired/Different from baseline Area of Impairment: Attention, Memory, Following commands, Safety/judgement, Awareness, Problem solving Current Attention Level: Sustained Memory: Decreased short-term memory Following Commands: Follows one step commands consistently, Follows one step commands with increased time, Follows multi-step commands inconsistently, Follows multi-step commands with increased time Safety/Judgement: Decreased awareness of safety, Decreased awareness of deficits Awareness: Emergent Problem Solving: Slow processing, Decreased initiation, Difficulty sequencing, Requires verbal cues General Comments: AAOx4 and pleasant throughout session.  Blood pressure 128/84, pulse 70, temperature 98.5 F (36.9 C), temperature source Oral, resp. rate 19, height 5\' 1"  (1.549 m), weight 85.1 kg, SpO2 95%. Physical Exam Constitutional:      General: She is not in acute distress.    Appearance: She is obese. She is not ill-appearing.  HENT:     Head: Normocephalic.     Right Ear: External ear normal.     Left Ear: External ear normal.     Nose: Nose normal.     Mouth/Throat:     Pharynx: Oropharynx is clear.  Eyes:     Extraocular Movements: Extraocular movements intact.     Pupils: Pupils are equal, round, and reactive to light.  Cardiovascular:     Rate and Rhythm: Normal rate.  Pulmonary:     Effort:  Pulmonary effort is normal.  Abdominal:     Palpations: Abdomen is soft.  Musculoskeletal:        General: No swelling or tenderness. Normal range of motion.     Cervical back: Normal range of motion.  Skin:    General: Skin is warm and dry.  Neurological:     Mental Status: She is alert.     Comments: Alert and oriented x 3. Normal insight and awareness. Intact Memory. Normal language and speech. Cranial nerve exam unremarkable without any obvious field cuts,  perhaps mild left central 7. Mild left inattention MMT: LUE 4/5 prox to distal. LLE 3-/5 HF, 3/5 KE and 4/5 ADF/PF. RUE and RLE 4+ to 5/5. Senses pain and LT equally in all 4's. No abnl resting tone. DTR's  trace on right. .    Psychiatric:        Mood and Affect: Mood normal.        Behavior: Behavior normal.     Results for orders placed or performed during the hospital encounter of 12/19/22 (from the past 24 hour(s))  Glucose, capillary     Status: Abnormal   Collection Time: 12/21/22 11:59 AM  Result Value Ref Range   Glucose-Capillary 114 (H) 70 - 99 mg/dL  Glucose, capillary     Status: Abnormal   Collection Time: 12/21/22  4:16 PM  Result Value Ref Range   Glucose-Capillary 101 (H) 70 - 99 mg/dL  Rapid urine drug screen (hospital performed)     Status: None   Collection Time: 12/21/22  4:30 PM  Result Value Ref Range   Opiates NONE DETECTED NONE DETECTED   Cocaine NONE DETECTED NONE DETECTED   Benzodiazepines NONE DETECTED NONE DETECTED   Amphetamines NONE DETECTED NONE DETECTED   Tetrahydrocannabinol NONE DETECTED NONE DETECTED   Barbiturates NONE DETECTED NONE DETECTED  Glucose, capillary     Status: Abnormal   Collection Time: 12/21/22  9:25 PM  Result Value Ref Range   Glucose-Capillary 116 (H) 70 - 99 mg/dL  Comprehensive metabolic panel     Status: Abnormal   Collection Time: 12/22/22  3:51 AM  Result Value Ref Range   Sodium 133 (L) 135 - 145 mmol/L   Potassium 3.6 3.5 - 5.1 mmol/L   Chloride 102 98 - 111 mmol/L   CO2 23 22 - 32 mmol/L   Glucose, Bld 107 (H) 70 - 99 mg/dL   BUN 12 8 - 23 mg/dL   Creatinine, Ser 7.42 0.44 - 1.00 mg/dL   Calcium 8.7 (L) 8.9 - 10.3 mg/dL   Total Protein 6.9 6.5 - 8.1 g/dL   Albumin 3.0 (L) 3.5 - 5.0 g/dL   AST 25 15 - 41 U/L   ALT 24 0 - 44 U/L   Alkaline Phosphatase 74 38 - 126 U/L   Total Bilirubin 0.8 <1.2 mg/dL   GFR, Estimated >59 >56 mL/min   Anion gap 8 5 - 15  CBC     Status: None   Collection Time: 12/22/22  3:51  AM  Result Value Ref Range   WBC 6.3 4.0 - 10.5 K/uL   RBC 4.22 3.87 - 5.11 MIL/uL   Hemoglobin 12.1 12.0 - 15.0 g/dL   HCT 38.7 56.4 - 33.2 %   MCV 87.2 80.0 - 100.0 fL   MCH 28.7 26.0 - 34.0 pg   MCHC 32.9 30.0 - 36.0 g/dL   RDW 95.1 88.4 - 16.6 %   Platelets 195 150 - 400 K/uL  nRBC 0.0 0.0 - 0.2 %  Magnesium     Status: None   Collection Time: 12/22/22  3:51 AM  Result Value Ref Range   Magnesium 1.8 1.7 - 2.4 mg/dL  Troponin I (High Sensitivity)     Status: None   Collection Time: 12/22/22  7:29 AM  Result Value Ref Range   Troponin I (High Sensitivity) 10 <18 ng/L  Glucose, capillary     Status: None   Collection Time: 12/22/22  7:41 AM  Result Value Ref Range   Glucose-Capillary 94 70 - 99 mg/dL  Troponin I (High Sensitivity)     Status: None   Collection Time: 12/22/22 10:11 AM  Result Value Ref Range   Troponin I (High Sensitivity) 10 <18 ng/L  Glucose, capillary     Status: Abnormal   Collection Time: 12/22/22 11:43 AM  Result Value Ref Range   Glucose-Capillary 117 (H) 70 - 99 mg/dL   ECHOCARDIOGRAM COMPLETE  Result Date: 12/21/2022    ECHOCARDIOGRAM REPORT   Patient Name:   SONG STEADY Date of Exam: 12/21/2022 Medical Rec #:  [id number]      Height:       61.0 in Accession #:    [id number]     Weight:       187.6 lb Date of Birth:  03/30/1955       BSA:          1.838 m Patient Age:    67 years       BP:           145/77 mmHg Patient Gender: F              HR:           56 bpm. Exam Location:  Inpatient Procedure: 2D Echo, Color Doppler and Cardiac Doppler Indications:    Stroke  History:        Patient has prior history of Echocardiogram examinations, most                 recent 03/20/2020. Cardiomyopathy, Stroke; Risk Factors:Diabetes                 and Hypertension.  Sonographer:    Milbert Coulter Referring Phys: [id number] JARED M GARDNER IMPRESSIONS  1. Left ventricular ejection fraction, by estimation, is 45 to 50%. The left ventricle has mildly decreased function. The  left ventricle demonstrates global hypokinesis. There is mild concentric left ventricular hypertrophy. Left ventricular diastolic parameters are indeterminate.  2. Right ventricular systolic function is normal. The right ventricular size is normal.  3. The mitral valve is normal in structure. Moderate mitral valve regurgitation. No evidence of mitral stenosis.  4. Tricuspid valve regurgitation is moderate.  5. The aortic valve is normal in structure. Aortic valve regurgitation is not visualized. No aortic stenosis is present.  6. The inferior vena cava is normal in size with greater than 50% respiratory variability, suggesting right atrial pressure of 3 mmHg. FINDINGS  Left Ventricle: Left ventricular ejection fraction, by estimation, is 45 to 50%. The left ventricle has mildly decreased function. The left ventricle demonstrates global hypokinesis. The left ventricular internal cavity size was normal in size. There is  mild concentric left ventricular hypertrophy. Left ventricular diastolic parameters are indeterminate. Right Ventricle: The right ventricular size is normal. No increase in right ventricular wall thickness. Right ventricular systolic function is normal. Left Atrium: Left atrial size was normal in size. Right Atrium: Right atrial size was normal in size.  Pericardium: There is no evidence of pericardial effusion. Mitral Valve: The mitral valve is normal in structure. Moderate mitral valve regurgitation. No evidence of mitral valve stenosis. Tricuspid Valve: The tricuspid valve is normal in structure. Tricuspid valve regurgitation is moderate . No evidence of tricuspid stenosis. Aortic Valve: The aortic valve is normal in structure. Aortic valve regurgitation is not visualized. No aortic stenosis is present. Pulmonic Valve: The pulmonic valve was normal in structure. Pulmonic valve regurgitation is trivial. No evidence of pulmonic stenosis. Aorta: The aortic root is normal in size and structure. Venous:  The inferior vena cava is normal in size with greater than 50% respiratory variability, suggesting right atrial pressure of 3 mmHg. IAS/Shunts: No atrial level shunt detected by color flow Doppler.  LEFT VENTRICLE PLAX 2D LVIDd:         5.30 cm   Diastology LVIDs:         4.10 cm   LV e' medial:    6.42 cm/s LV PW:         1.20 cm   LV E/e' medial:  13.1 LV IVS:        1.10 cm   LV e' lateral:   10.00 cm/s LVOT diam:     1.85 cm   LV E/e' lateral: 8.4 LV SV:         43 LV SV Index:   23 LVOT Area:     2.69 cm  RIGHT VENTRICLE RV Basal diam:  3.60 cm RV Mid diam:    2.90 cm RV S prime:     14.30 cm/s TAPSE (M-mode): 2.0 cm LEFT ATRIUM             Index        RIGHT ATRIUM           Index LA diam:        4.60 cm 2.50 cm/m   RA Area:     14.20 cm LA Vol (A2C):   77.8 ml 42.33 ml/m  RA Volume:   33.30 ml  18.12 ml/m LA Vol (A4C):   55.3 ml 30.09 ml/m LA Biplane Vol: 67.7 ml 36.83 ml/m  AORTIC VALVE LVOT Vmax:   92.70 cm/s LVOT Vmean:  54.300 cm/s LVOT VTI:    0.159 m  AORTA Ao Root diam: 2.90 cm Ao Asc diam:  3.00 cm MITRAL VALVE                  TRICUSPID VALVE MV Area (PHT): 4.36 cm       TR Peak grad:   36.5 mmHg MV Decel Time: 174 msec       TR Vmax:        302.00 cm/s MR Peak grad:    102.0 mmHg MR Mean grad:    67.0 mmHg    SHUNTS MR Vmax:         505.00 cm/s  Systemic VTI:  0.16 m MR Vmean:        391.0 cm/s   Systemic Diam: 1.85 cm MR PISA:         1.57 cm MR PISA Eff ROA: 10 mm MR PISA Radius:  0.50 cm MV E velocity: 84.20 cm/s MV A velocity: 45.30 cm/s MV E/A ratio:  1.86 Kardie Tobb DO Electronically signed by Thomasene Ripple DO Signature Date/Time: 12/21/2022/3:33:58 PM    Final     Assessment/Plan: Diagnosis: 67 year old female status post right brain infarcts (caudate head and right ACA/MCA watershed areas) due to right ICA  stenosis.  Patient tentatively scheduled for TCAR tomorrow Does the need for close, 24 hr/day medical supervision in concert with the patient's rehab needs make it  unreasonable for this patient to be served in a less intensive setting? Yes and Potentially Co-Morbidities requiring supervision/potential complications:  -Diabetes -Hypertension Due to bladder management, bowel management, safety, skin/wound care, disease management, medication administration, pain management, and patient education, does the patient require 24 hr/day rehab nursing? Yes Does the patient require coordinated care of a physician, rehab nurse, therapy disciplines of PT, OT to address physical and functional deficits in the context of the above medical diagnosis(es)? Yes Addressing deficits in the following areas: balance, endurance, locomotion, strength, transferring, bowel/bladder control, bathing, dressing, feeding, grooming, toileting, and psychosocial support Can the patient actively participate in an intensive therapy program of at least 3 hrs of therapy per day at least 5 days per week? Yes The potential for patient to make measurable gains while on inpatient rehab is excellent Anticipated functional outcomes upon discharge from inpatient rehab are modified independent  with PT, modified independent with OT, n/a with SLP. Estimated rehab length of stay to reach the above functional goals is: 7 days Anticipated discharge destination: Home Overall Rehab/Functional Prognosis: excellent  POST ACUTE RECOMMENDATIONS: This patient's condition is appropriate for continued rehabilitative care in the following setting: CIR Patient has agreed to participate in recommended program. Yes Note that insurance prior authorization may be required for reimbursement for recommended care.  Comment: Will await TCAR tomorrow and see how she responds afterwards. I project a 7 day ELOS with mod I goals. Rehab Admissions Coordinator to follow up.      I have personally performed a face to face diagnostic evaluation of this patient. Additionally, I have examined the patient's medical record including  any pertinent labs and radiographic images. If the physician assistant has documented in this note, I have reviewed and edited or otherwise concur with the physician assistant's documentation.  Thanks,  Ranelle Oyster, MD 12/22/2022

## 2022-12-22 NOTE — Progress Notes (Signed)
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  Goals of Care:   Code Status: Full Code   DVT prophylaxis: enoxaparin (LOVENOX) injection 40 mg Start: 12/20/22 1000  Interim Hx: Patient reported the acute onset of chest pain this morning which she rated a 7/10 and described as pressure.  She was resting in bed at the time.  EKG reveals a LBBB which was also noted 11/23, but was not present on prior EKG Feb 2022 (though findings suggestive of LVH were noted on EKG in 2022).  Initial troponin reassuring at 10.  At the time of my visit her pain has resolved.  She confirms to me that prior to this admission she was ambulating normally and exerting herself with no substernal chest pressure or dyspnea on exertion whatsoever.  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 just distal to its origin with unchanged 50% stenosis of the proximal right ICA at the bifurcation with no other intracranial large vessel occlusion -TTE notes EF 45-50% with global hypokinesis and mild concentric left ventricular hypertrophy and normal RV with no obvious intracardiac source of embolization -medical treatment: Aspirin plus Plavix -LDL 152 -A1c 6.9 -PT/OT/SLP -Vascular surgery evaluation of right  carotid artery as clinically it is felt this is most likely significantly greater than 50% stenosis and the etiology of her strokes  Right carotid artery stenosis CTA head and neck this admission reported 50% right ICA stenosis but neurology feels this is more consistent with 65-70% to their interpretation, and clinically her situation is concerning for a carotid source of her CVAs - Vascular Surgery following and patient to undergo TCAR on Tuesday  Chest pain with LBBB Troponin very reassuring at 10 x 2 - EKG not helpful due to LBBB, with comment that EKG approximately 2 years ago did display findings consistent with LVH suggesting that LBBB on her current EKG is simply due to progression of this LVH - no focal WMA on TTE -no history of angina prior to this admission, and patient's description of pain is not consistent with [place] PE - I do not feel that any further restratification is required and the patient is stable for her planned surgery tomorrow  Remote history of breast cancer Has been in remission for 11 years  Controlled DM2 A1c 6.9 -CBG well-controlled  HTN Blood pressure goal 130-160 -stable presently within this range  HLD LDL poorly controlled reportedly on Crestor - changed to high-dose Lipitor this admission -continue Zetia   Family Communication: No family present at time of visit Disposition: Anticipate possible need for rehab postprocedure   Objective: Blood pressure (!) 155/96, pulse 70, temperature 98.1 F (36.7 C), temperature source Oral, resp. rate 19, height 5\' 1"  (1.549 m), weight 85.1 kg, SpO2 95%.  Intake/Output Summary (Last 24 hours) at 12/22/2022 0955 Last data filed at  12/21/2022 1614 Gross per 24 hour  Intake --  Output 300 ml  Net -300 ml   Filed Weights   12/19/22 2300 12/20/22 0400  Weight: 85.1 kg 85.1 kg    Examination: General: No acute respiratory distress Lungs: Clear to auscultation bilaterally without wheezing or focal  crackles Cardiovascular: Regular rate and rhythm without murmur gallop or rub  Abdomen: Nontender, nondistended, soft, bowel sounds positive, no rebound, no ascites, no appreciable mass Extremities: No significant cyanosis, clubbing, or edema bilateral lower extremities   CBC: Recent Labs  Lab 12/19/22 1715 12/22/22 0351  WBC 7.9 6.3  NEUTROABS 5.2  --   HGB 14.1 12.1  HCT 43.8 36.8  MCV 87.4 87.2  PLT 231 195   Basic Metabolic Panel: Recent Labs  Lab 12/19/22 1715 12/22/22 0351  NA 138 133*  K 4.2 3.6  CL 103 102  CO2 25 23  GLUCOSE 142* 107*  BUN 15 12  CREATININE 0.94 0.77  CALCIUM 9.3 8.7*  MG  --  1.8   GFR: Estimated Creatinine Clearance: 69.4 mL/min (by C-G formula based on SCr of 0.77 mg/dL).   Scheduled Meds:  aspirin EC  81 mg Oral Daily   atorvastatin  80 mg Oral Daily   clopidogrel  75 mg Oral Daily   enoxaparin (LOVENOX) injection  40 mg Subcutaneous Q24H   ezetimibe  10 mg Oral Daily   insulin aspart  0-15 Units Subcutaneous TID WC   insulin aspart  0-5 Units Subcutaneous QHS     LOS: 2 days   Lonia Blood, MD [facility] Office  [phone] Pager - Text Page per Loretha Stapler  If 7PM-7AM, please contact night-coverage per Amion 12/22/2022, 9:55 AM

## 2022-12-22 NOTE — Progress Notes (Signed)
Physical Therapy Treatment Patient Details Name: Deborah [name] MRN: [id number] DOB: Nov 28, 1955 Today's Date: 12/22/2022   History of Present Illness Pt is 67 yo presenting to [facility] with reports of L sided numbness that was present on awakening on 11/22. 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    PT Comments  Pt continues to demonstrate mobility deficits related to balance, strength, and cognition. Pt with decr awareness of deficits. Pt very motivated to work toward independence. Patient will benefit from intensive inpatient follow up therapy, >3 hours/day.    If plan is discharge home, recommend the following: A little help with walking and/or transfers;Assistance with cooking/housework;Assist for transportation;Supervision due to cognitive status   Can travel by private vehicle        Equipment Recommendations  Other (comment) (To be determined)    Recommendations for Other Services       Precautions / Restrictions Precautions Precautions: Fall Restrictions Weight Bearing Restrictions: No     Mobility  Bed Mobility Overal bed mobility: Needs Assistance Bed Mobility: Supine to Sit     Supine to sit: Supervision     General bed mobility comments: Assist for safety. Incr time to perform    Transfers Overall transfer level: Needs assistance Equipment used: Rolling walker (2 wheels) Transfers: Sit to/from Stand Sit to Stand: Min assist           General transfer comment: Assist to power up and stabilize. Verbal cues for hand placement    Ambulation/Gait Ambulation/Gait assistance: Min assist, Mod assist Gait Distance (Feet): 65 Feet Assistive device: Rolling walker (2 wheels) Gait Pattern/deviations: Step-through pattern, Decreased step length - left, Decreased dorsiflexion - left, Drifts right/left Gait velocity: decreased Gait velocity  interpretation: <1.8 ft/sec, indicate of risk for recurrent falls   General Gait Details: Assist for balance and support. Mod assist to manage walker with pt running into objects on left and on turns getting feet outside walker. Several instances of left foot hitting leg of walker.   Stairs             Wheelchair Mobility     Tilt Bed    Modified Rankin (Stroke Patients Only)       Balance Overall balance assessment: Needs assistance Sitting-balance support: No upper extremity supported, Feet supported Sitting balance-Leahy Scale: Fair     Standing balance support: Bilateral upper extremity supported Standing balance-Leahy Scale: Poor Standing balance comment: walker and CGA for static standing                            Cognition Arousal: Alert Behavior During Therapy: WFL for tasks assessed/performed Overall Cognitive Status: Impaired/Different from baseline Area of Impairment: Attention, Memory, Following commands, Safety/judgement, Awareness, Problem solving                   Current Attention Level: Sustained Memory: Decreased short-term memory Following Commands: Follows one step commands consistently, Follows one step commands with increased time, Follows multi-step commands inconsistently, Follows multi-step commands with increased time Safety/Judgement: Decreased awareness of safety, Decreased awareness of deficits Awareness: Emergent Problem Solving: Slow processing, Decreased initiation, Difficulty sequencing, Requires verbal cues          Exercises      General Comments        Pertinent Vitals/Pain Pain Assessment Pain Assessment: No/denies pain    Home Living  Prior Function            PT Goals (current goals can now be found in the care plan section) Acute Rehab PT Goals Patient Stated Goal: go home Progress towards PT goals: Progressing toward goals    Frequency    Min  1X/week      PT Plan      Co-evaluation              AM-PAC PT "6 Clicks" Mobility   Outcome Measure  Help needed turning from your back to your side while in a flat bed without using bedrails?: A Little Help needed moving from lying on your back to sitting on the side of a flat bed without using bedrails?: A Little Help needed moving to and from a bed to a chair (including a wheelchair)?: A Little Help needed standing up from a chair using your arms (e.g., wheelchair or bedside chair)?: A Little Help needed to walk in hospital room?: A Little Help needed climbing 3-5 steps with a railing? : Total 6 Click Score: 16    End of Session Equipment Utilized During Treatment: Gait belt Activity Tolerance: Patient tolerated treatment well Patient left: in chair;with call bell/phone within reach;with chair alarm set Nurse Communication: Mobility status PT Visit Diagnosis: Other abnormalities of gait and mobility (R26.89);Hemiplegia and hemiparesis Hemiplegia - Right/Left: Left Hemiplegia - dominant/non-dominant: Non-dominant Hemiplegia - caused by: Cerebral infarction     Time: 1040-1100 PT Time Calculation (min) (ACUTE ONLY): 20 min  Charges:    $Gait Training: 8-22 mins PT General Charges $$ ACUTE PT VISIT: 1 Visit                     [facility] PT Acute Rehabilitation Services Office [phone]    Angelina Ok [facility] 12/22/2022, 11:30 AM

## 2022-12-22 NOTE — Progress Notes (Signed)
Occupational Therapy Treatment Patient Details Name: Deborah [name] MRN: [id number] DOB: February 26, 1955 Today's Date: 12/22/2022   History of present illness Pt is 67 yo presenting to [facility] with reports of L sided numbness that was present on awakening on 11/22. 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   OT comments  Focus session on challenging attention to L side of body and environment as well as optimizing insight into deficits. Pt needing mod cues for scanning to avoid bumping into obstacles during in-room mobility as well as cues/education for RW placement and safe use during ADL. Pt needing increased time to locate items on L during Adl at sink. Upon reflection, poor insight into how current deficits affect safety during ADL. Recommending intensive multidisciplinary rehabilitation >3 hours/day to optimize safety and independence in ADL.        If plan is discharge home, recommend the following:  A little help with walking and/or transfers;A lot of help with bathing/dressing/bathroom;Assistance with cooking/housework;Assistance with feeding;Direct supervision/assist for medications management;Direct supervision/assist for financial management;Assist for transportation;Help with stairs or ramp for entrance;Supervision due to cognitive status   Equipment Recommendations  Other (comment)    Recommendations for Other Services Rehab consult    Precautions / Restrictions Precautions Precautions: Fall Restrictions Weight Bearing Restrictions: No       Mobility Bed Mobility Overal bed mobility: Needs Assistance Bed Mobility: Sit to Supine       Sit to supine: Min assist   General bed mobility comments: assist for safety; pt attempting to roll into bed and poor awareness of location of bed rails with OT intervening to avoid putting head on bedrail opposite side of bed  entering on    Transfers Overall transfer level: Needs assistance Equipment used: Rolling walker (2 wheels) Transfers: Sit to/from Stand Sit to Stand: Min assist           General transfer comment: to power up. Cues for hand placement up from chair; good use of rail up from toilet. Cues for reaching back on all descent as well as to avoid a premature sit     Balance Overall balance assessment: Needs assistance Sitting-balance support: No upper extremity supported, Feet supported Sitting balance-Leahy Scale: Fair     Standing balance support: Bilateral upper extremity supported Standing balance-Leahy Scale: Poor                             ADL either performed or assessed with clinical judgement   ADL Overall ADL's : Needs assistance/impaired     Grooming: Minimal assistance;Oral care;Wash/dry face;Wash/dry hands;Standing;Cueing for compensatory techniques Grooming Details (indicate cue type and reason): able to follow a 3 step command, but increased time for locating items on L and with needing additional cues for problem solving to best use environment. Additionally noted poor safety and RW placement with approaching sink. Almost walking past sink on L needing direct cues to locate sink in room.                 Toilet Transfer: Minimal assistance;Cueing for safety;Cueing for sequencing;BSC/3in1;Rolling walker (2 wheels) Toilet Transfer Details (indicate cue type and reason): poor safety and RW management when entering restroom Toileting- Clothing Manipulation and Hygiene: Minimal assistance;Cueing for compensatory techniques Toileting - Clothing Manipulation Details (indicate cue type and reason): cues for attention to L side of pants and for use of  L hand to facilitate pulling up/down underpants     Functional mobility during ADLs: Minimal assistance;Rolling walker (2 wheels);Cueing for safety (cues for attention to L side)      Extremity/Trunk  Assessment Upper Extremity Assessment Upper Extremity Assessment: LUE deficits/detail;RUE deficits/detail RUE Deficits / Details: generalized weakness; decreased fine motor coordination LUE Deficits / Details: L inattention/neglect with decreased functional use during ADL. decreased proprioception with overshooting noted during functional use LUE Coordination: decreased fine motor;decreased gross motor   Lower Extremity Assessment Lower Extremity Assessment: Defer to PT evaluation        Vision   Vision Assessment?: Yes;Vision impaired- to be further tested in functional context Eye Alignment: Within Functional Limits Ocular Range of Motion: Within Functional Limits Depth Perception: Overshoots;Undershoots (overshot toothpaste on toothbrush at midline but undershooting reaching for water on L) Additional Comments: able to locate items with increased time while at sink without cues, but bumping into items on L consistently during functional mobility. poor awareness and insight into deficits   Perception Perception Perception: Impaired Preception Impairment Details: Inattention/Neglect (L)   Praxis Praxis Praxis: Impaired Praxis Impairment Details: Initiation;Motor planning;Organization    Cognition Arousal: Alert Behavior During Therapy: WFL for tasks assessed/performed Overall Cognitive Status: Impaired/Different from baseline Area of Impairment: Attention, Memory, Following commands, Safety/judgement, Awareness, Problem solving                   Current Attention Level: Sustained (decr attention to R) Memory: Decreased short-term memory Following Commands: Follows one step commands consistently, Follows one step commands with increased time, Follows multi-step commands inconsistently, Follows multi-step commands with increased time Safety/Judgement: Decreased awareness of safety, Decreased awareness of deficits Awareness: Emergent Problem Solving: Slow processing,  Decreased initiation, Difficulty sequencing, Requires verbal cues General Comments: AAOx4 and pleasant throughout session. Needing mod cues for attention to L side during mobility in room; observed to bump into several obstacles on L. was abl;e to locate grooming items at sink, but poor overall awareness of the L (cleaned only R side of sink despite L being more messy); able to use BUE for bil tasks, but did not initiate pulling down/up pants with LUE without heavy cueing. When reflecting on session to discuss areas for improvement, pt resistant to acknolwedging that deficits are present        Exercises      Shoulder Instructions       General Comments      Pertinent Vitals/ Pain       Pain Assessment Pain Assessment: No/denies pain  Home Living                                          Prior Functioning/Environment              Frequency  Min 1X/week        Progress Toward Goals  OT Goals(current goals can now be found in the care plan section)  Progress towards OT goals: Progressing toward goals  Acute Rehab OT Goals Patient Stated Goal: get home OT Goal Formulation: With patient Time For Goal Achievement: 01/03/23 Potential to Achieve Goals: Good ADL Goals Pt Will Perform Grooming: with supervision;standing (with pt demonstrating use of B UE and attention to Left side during tasks) Pt Will Perform Upper Body Bathing: with contact guard assist;sitting (with pt demonstrating use of B UE and attention to Left side during task) Pt  Will Perform Lower Body Dressing: with min assist;sitting/lateral leans;sit to/from stand (with pt demonstrating use of B UE and attention to Left side during task) Pt Will Transfer to Toilet: with supervision;ambulating;regular height toilet;grab bars (with least restrictive AD and demonstrating awareness of L side during transfer) Pt Will Perform Toileting - Clothing Manipulation and hygiene: with min  assist;sitting/lateral leans;sit to/from stand Additional ADL Goal #1: Patient will demonstrate ability to locate and retrieve items needed for grooming tasks in her room with Supervision while using least restrictive AD.  Plan      Co-evaluation                 AM-PAC OT "6 Clicks" Daily Activity     Outcome Measure   Help from another person eating meals?: A Little Help from another person taking care of personal grooming?: A Lot Help from another person toileting, which includes using toliet, bedpan, or urinal?: A Lot Help from another person bathing (including washing, rinsing, drying)?: A Lot Help from another person to put on and taking off regular upper body clothing?: A Little Help from another person to put on and taking off regular lower body clothing?: A Lot 6 Click Score: 14    End of Session Equipment Utilized During Treatment: Rolling walker (2 wheels)  OT Visit Diagnosis: Unsteadiness on feet (R26.81);Other abnormalities of gait and mobility (R26.89);Muscle weakness (generalized) (M62.81);Ataxia, unspecified (R27.0);Other symptoms and signs involving cognitive function   Activity Tolerance Patient tolerated treatment well   Patient Left in bed;with call bell/phone within reach;with bed alarm set   Nurse Communication Mobility status        Time: 6962-9528 OT Time Calculation (min): 22 min  Charges: OT General Charges $OT Visit: 1 Visit OT Treatments $Self Care/Home Management : 8-22 mins  Myrla Halsted, OTD, OTR/L [facility] Acute Rehabilitation Office: [phone]   Myrla Halsted 12/22/2022, 2:18 PM

## 2022-12-23 ENCOUNTER — Inpatient Hospital Stay (HOSPITAL_COMMUNITY): Payer: 59 | Admitting: Certified Registered Nurse Anesthetist

## 2022-12-23 ENCOUNTER — Other Ambulatory Visit: Payer: Self-pay

## 2022-12-23 ENCOUNTER — Encounter (HOSPITAL_COMMUNITY)
Admission: EM | Disposition: A | Discharge status: Home / Self Care | Payer: Self-pay | Source: Home / Self Care | Attending: Internal Medicine

## 2022-12-23 ENCOUNTER — Inpatient Hospital Stay (HOSPITAL_COMMUNITY): Payer: 59

## 2022-12-23 ENCOUNTER — Encounter (HOSPITAL_COMMUNITY): Payer: Self-pay | Admitting: Internal Medicine

## 2022-12-23 DIAGNOSIS — I69398 Other sequelae of cerebral infarction: Secondary | ICD-10-CM | POA: Diagnosis not present

## 2022-12-23 DIAGNOSIS — I6521 Occlusion and stenosis of right carotid artery: Secondary | ICD-10-CM

## 2022-12-23 DIAGNOSIS — I639 Cerebral infarction, unspecified: Secondary | ICD-10-CM | POA: Diagnosis not present

## 2022-12-23 HISTORY — PX: TRANSCAROTID ARTERY REVASCULARIZATIONÂ: SHX6778

## 2022-12-23 LAB — BASIC METABOLIC PANEL
Anion gap: 8 (ref 5–15)
BUN: 15 mg/dL (ref 8–23)
CO2: 25 mmol/L (ref 22–32)
Calcium: 9.2 mg/dL (ref 8.9–10.3)
Chloride: 103 mmol/L (ref 98–111)
Creatinine, Ser: 0.84 mg/dL (ref 0.44–1.00)
GFR, Estimated: >60 mL/min (ref >60)
Glucose, Bld: 104 mg/dL — ABNORMAL HIGH (ref 70–99)
Potassium: 3.9 mmol/L (ref 3.5–5.1)
Sodium: 136 mmol/L (ref 135–145)

## 2022-12-23 LAB — GLUCOSE, CAPILLARY
Glucose-Capillary: 105 mg/dL — ABNORMAL HIGH (ref 70–99)
Glucose-Capillary: 128 mg/dL — ABNORMAL HIGH (ref 70–99)
Glucose-Capillary: 177 mg/dL — ABNORMAL HIGH (ref 70–99)
Glucose-Capillary: 165 mg/dL — ABNORMAL HIGH (ref 70–99)
Glucose-Capillary: 161 mg/dL — ABNORMAL HIGH (ref 70–99)

## 2022-12-23 LAB — CBC
HCT: 37.4 % (ref 36.0–46.0)
Hemoglobin: 12.3 g/dL (ref 12.0–15.0)
MCH: 28.6 pg (ref 26.0–34.0)
MCHC: 32.9 g/dL (ref 30.0–36.0)
MCV: 87 fL (ref 80.0–100.0)
Platelets: 202 10*3/uL (ref 150–400)
RBC: 4.3 MIL/uL (ref 3.87–5.11)
RDW: 15.2 % (ref 11.5–15.5)
WBC: 5.6 10*3/uL (ref 4.0–10.5)
nRBC: 0 % (ref 0.0–0.2)

## 2022-12-23 LAB — POCT ACTIVATED CLOTTING TIME
Activated Clotting Time: 285 s
Activated Clotting Time: 135 s

## 2022-12-23 SURGERY — TRANSCAROTID ARTERY REVASCULARIZATION (TCAR)
Anesthesia: General | Site: Neck | Laterality: Right

## 2022-12-23 MED ORDER — SODIUM CHLORIDE 0.9 % IV SOLN: INTRAVENOUS | Status: DC | PRN

## 2022-12-23 MED ORDER — ALUM & MAG HYDROXIDE-SIMETH 200-200-20 MG/5ML PO SUSP: 15.0000 mL | ORAL | Status: DC | PRN

## 2022-12-23 MED ORDER — POTASSIUM CHLORIDE CRYS ER 20 MEQ PO TBCR: 20.0000 meq | EXTENDED_RELEASE_TABLET | Freq: Every day | ORAL | Status: DC | PRN

## 2022-12-23 MED ORDER — LABETALOL HCL 5 MG/ML IV SOLN: 10.0000 mg | INTRAVENOUS | Status: DC | PRN

## 2022-12-23 MED ORDER — MAGNESIUM SULFATE 2 GM/50ML IV SOLN: 2.0000 g | Freq: Every day | INTRAVENOUS | Status: DC | PRN

## 2022-12-23 MED ORDER — PROTAMINE SULFATE 10 MG/ML IV SOLN
INTRAVENOUS | Status: AC
  Filled 2022-12-23: qty 5

## 2022-12-23 MED ORDER — 0.9 % SODIUM CHLORIDE (POUR BTL) OPTIME
TOPICAL | Status: DC | PRN
  Administered 2022-12-23: 1000 mL

## 2022-12-23 MED ORDER — ROCURONIUM BROMIDE 10 MG/ML (PF) SYRINGE
PREFILLED_SYRINGE | INTRAVENOUS | Status: AC
  Filled 2022-12-23: qty 10

## 2022-12-23 MED ORDER — CHLORHEXIDINE GLUCONATE 0.12 % MT SOLN
OROMUCOSAL | Status: AC
  Administered 2022-12-23: 15 mL via OROMUCOSAL
  Filled 2022-12-23: qty 15

## 2022-12-23 MED ORDER — PROPOFOL 500 MG/50ML IV EMUL
INTRAVENOUS | Status: DC | PRN
  Administered 2022-12-23: 50 ug/kg/min via INTRAVENOUS

## 2022-12-23 MED ORDER — LIDOCAINE 2% (20 MG/ML) 5 ML SYRINGE
INTRAMUSCULAR | Status: DC | PRN
  Administered 2022-12-23: 100 mg via INTRAVENOUS

## 2022-12-23 MED ORDER — PANTOPRAZOLE SODIUM 40 MG PO TBEC
40.0000 mg | DELAYED_RELEASE_TABLET | Freq: Every day | ORAL | Status: DC
  Administered 2022-12-23 – 2022-12-28 (×6): 40 mg via ORAL
  Filled 2022-12-23 (×6): qty 1

## 2022-12-23 MED ORDER — PHENOL 1.4 % MT LIQD
1.0000 | OROMUCOSAL | Status: DC | PRN
  Filled 2022-12-23: qty 177

## 2022-12-23 MED ORDER — PHENYLEPHRINE HCL-NACL 20-0.9 MG/250ML-% IV SOLN
INTRAVENOUS | Status: DC | PRN
  Administered 2022-12-23: 50 ug/min via INTRAVENOUS
  Administered 2022-12-23: 160 ug via INTRAVENOUS
  Administered 2022-12-23 (×2): 80 ug via INTRAVENOUS
  Administered 2022-12-23 (×2): 160 ug via INTRAVENOUS

## 2022-12-23 MED ORDER — DEXAMETHASONE SODIUM PHOSPHATE 10 MG/ML IJ SOLN
INTRAMUSCULAR | Status: DC | PRN
  Administered 2022-12-23: 10 mg via INTRAVENOUS

## 2022-12-23 MED ORDER — ONDANSETRON HCL 4 MG/2ML IJ SOLN
INTRAMUSCULAR | Status: DC | PRN
  Administered 2022-12-23: 4 mg via INTRAVENOUS

## 2022-12-23 MED ORDER — DOCUSATE SODIUM 100 MG PO CAPS
100.0000 mg | ORAL_CAPSULE | Freq: Every day | ORAL | Status: DC
  Administered 2022-12-24 – 2022-12-28 (×5): 100 mg via ORAL
  Filled 2022-12-23 (×5): qty 1

## 2022-12-23 MED ORDER — HEPARIN 6000 UNIT IRRIGATION SOLUTION
Status: AC
  Filled 2022-12-23: qty 500

## 2022-12-23 MED ORDER — ENOXAPARIN SODIUM 40 MG/0.4ML IJ SOSY
40.0000 mg | PREFILLED_SYRINGE | INTRAMUSCULAR | Status: DC
  Administered 2022-12-24 – 2022-12-28 (×5): 40 mg via SUBCUTANEOUS
  Filled 2022-12-23 (×5): qty 0.4

## 2022-12-23 MED ORDER — METOPROLOL TARTRATE 5 MG/5ML IV SOLN: 2.0000 mg | INTRAVENOUS | Status: DC | PRN

## 2022-12-23 MED ORDER — LIDOCAINE HCL (PF) 1 % IJ SOLN
INTRAMUSCULAR | Status: AC
  Filled 2022-12-23: qty 30

## 2022-12-23 MED ORDER — CLOPIDOGREL BISULFATE 75 MG PO TABS
75.0000 mg | ORAL_TABLET | Freq: Every day | ORAL | Status: DC
  Administered 2022-12-24 – 2022-12-28 (×5): 75 mg via ORAL
  Filled 2022-12-23 (×5): qty 1

## 2022-12-23 MED ORDER — CEFAZOLIN SODIUM-DEXTROSE 2-4 GM/100ML-% IV SOLN
2.0000 g | Freq: Three times a day (TID) | INTRAVENOUS | Status: AC
  Administered 2022-12-23 (×2): 2 g via INTRAVENOUS
  Filled 2022-12-23 (×2): qty 100

## 2022-12-23 MED ORDER — FENTANYL CITRATE (PF) 250 MCG/5ML IJ SOLN
INTRAMUSCULAR | Status: DC | PRN
  Administered 2022-12-23: 150 ug via INTRAVENOUS

## 2022-12-23 MED ORDER — FENTANYL CITRATE (PF) 250 MCG/5ML IJ SOLN
INTRAMUSCULAR | Status: AC
  Filled 2022-12-23: qty 5

## 2022-12-23 MED ORDER — GLYCOPYRROLATE PF 0.2 MG/ML IJ SOSY
PREFILLED_SYRINGE | INTRAMUSCULAR | Status: DC | PRN
  Administered 2022-12-23: .2 mg via INTRAVENOUS

## 2022-12-23 MED ORDER — ROCURONIUM BROMIDE 10 MG/ML (PF) SYRINGE
PREFILLED_SYRINGE | INTRAVENOUS | Status: DC | PRN
  Administered 2022-12-23: 70 mg via INTRAVENOUS
  Administered 2022-12-23: 10 mg via INTRAVENOUS

## 2022-12-23 MED ORDER — NEOSTIGMINE METHYLSULFATE 3 MG/3ML IV SOSY
PREFILLED_SYRINGE | INTRAVENOUS | Status: AC
  Filled 2022-12-23: qty 3

## 2022-12-23 MED ORDER — OXYCODONE HCL 5 MG PO TABS: 5.0000 mg | ORAL_TABLET | Freq: Once | ORAL | Status: DC | PRN

## 2022-12-23 MED ORDER — HEMOSTATIC AGENTS (NO CHARGE) OPTIME
TOPICAL | Status: DC | PRN
  Administered 2022-12-23: 2 via TOPICAL

## 2022-12-23 MED ORDER — ASPIRIN 81 MG PO TBEC
81.0000 mg | DELAYED_RELEASE_TABLET | Freq: Every day | ORAL | Status: DC
  Administered 2022-12-24 – 2022-12-28 (×5): 81 mg via ORAL
  Filled 2022-12-23 (×5): qty 1

## 2022-12-23 MED ORDER — PHENYLEPHRINE 80 MCG/ML (10ML) SYRINGE FOR IV PUSH (FOR BLOOD PRESSURE SUPPORT)
PREFILLED_SYRINGE | INTRAVENOUS | Status: AC
  Filled 2022-12-23: qty 10

## 2022-12-23 MED ORDER — SUGAMMADEX SODIUM 200 MG/2ML IV SOLN
INTRAVENOUS | Status: DC | PRN
  Administered 2022-12-23: 170.2 mg via INTRAVENOUS

## 2022-12-23 MED ORDER — CHLORHEXIDINE GLUCONATE 0.12 % MT SOLN: 15.0000 mL | Freq: Once | OROMUCOSAL | Status: AC

## 2022-12-23 MED ORDER — ORAL CARE MOUTH RINSE: 15.0000 mL | Freq: Once | OROMUCOSAL | Status: AC

## 2022-12-23 MED ORDER — CLEVIDIPINE BUTYRATE 0.5 MG/ML IV EMUL
INTRAVENOUS | Status: AC
  Filled 2022-12-23: qty 50

## 2022-12-23 MED ORDER — HEPARIN SODIUM (PORCINE) 1000 UNIT/ML IJ SOLN
INTRAMUSCULAR | Status: DC | PRN
  Administered 2022-12-23: 8000 [IU] via INTRAVENOUS

## 2022-12-23 MED ORDER — EPHEDRINE SULFATE-NACL 50-0.9 MG/10ML-% IV SOSY
PREFILLED_SYRINGE | INTRAVENOUS | Status: DC | PRN
Start: 2022-12-23 — End: 2022-12-23
  Administered 2022-12-23 (×2): 10 mg via INTRAVENOUS

## 2022-12-23 MED ORDER — LACTATED RINGERS IV SOLN: INTRAVENOUS | Status: DC

## 2022-12-23 MED ORDER — DROPERIDOL 2.5 MG/ML IJ SOLN: 0.6250 mg | Freq: Once | INTRAMUSCULAR | Status: DC | PRN

## 2022-12-23 MED ORDER — PROTAMINE SULFATE 10 MG/ML IV SOLN
INTRAVENOUS | Status: DC | PRN
  Administered 2022-12-23 (×5): 10 mg via INTRAVENOUS

## 2022-12-23 MED ORDER — SODIUM CHLORIDE 0.9 % IV SOLN: INTRAVENOUS | Status: DC

## 2022-12-23 MED ORDER — PROPOFOL 10 MG/ML IV BOLUS
INTRAVENOUS | Status: DC | PRN
  Administered 2022-12-23: 150 mg via INTRAVENOUS

## 2022-12-23 MED ORDER — LIDOCAINE 2% (20 MG/ML) 5 ML SYRINGE
INTRAMUSCULAR | Status: AC
  Filled 2022-12-23: qty 5

## 2022-12-23 MED ORDER — IODIXANOL 320 MG/ML IV SOLN
INTRAVENOUS | Status: DC | PRN
  Administered 2022-12-23: 25 mL

## 2022-12-23 MED ORDER — SODIUM CHLORIDE 0.9 % IV SOLN
500.0000 mL | Freq: Once | INTRAVENOUS | Status: AC | PRN
  Administered 2022-12-23: 500 mL via INTRAVENOUS

## 2022-12-23 MED ORDER — PHENYLEPHRINE HCL-NACL 20-0.9 MG/250ML-% IV SOLN
INTRAVENOUS | Status: AC
  Filled 2022-12-23: qty 250

## 2022-12-23 MED ORDER — PROPOFOL 10 MG/ML IV BOLUS
INTRAVENOUS | Status: AC
  Filled 2022-12-23: qty 20

## 2022-12-23 MED ORDER — DEXAMETHASONE SODIUM PHOSPHATE 10 MG/ML IJ SOLN
INTRAMUSCULAR | Status: AC
  Filled 2022-12-23: qty 1

## 2022-12-23 MED ORDER — HEPARIN SODIUM (PORCINE) 1000 UNIT/ML IJ SOLN
INTRAMUSCULAR | Status: AC
  Filled 2022-12-23: qty 10

## 2022-12-23 MED ORDER — EPHEDRINE 5 MG/ML INJ
INTRAVENOUS | Status: AC
  Filled 2022-12-23: qty 5

## 2022-12-23 MED ORDER — HYDRALAZINE HCL 20 MG/ML IJ SOLN: 5.0000 mg | INTRAMUSCULAR | Status: DC | PRN

## 2022-12-23 MED ORDER — FENTANYL CITRATE (PF) 100 MCG/2ML IJ SOLN: 25.0000 ug | INTRAMUSCULAR | Status: DC | PRN

## 2022-12-23 MED ORDER — INSULIN ASPART 100 UNIT/ML IJ SOLN: 0.0000 [IU] | INTRAMUSCULAR | Status: DC | PRN

## 2022-12-23 MED ORDER — ACETAMINOPHEN 10 MG/ML IV SOLN: 1000.0000 mg | Freq: Once | INTRAVENOUS | Status: DC | PRN

## 2022-12-23 MED ORDER — OXYCODONE HCL 5 MG/5ML PO SOLN: 5.0000 mg | Freq: Once | ORAL | Status: DC | PRN

## 2022-12-23 MED ORDER — HEPARIN 6000 UNIT IRRIGATION SOLUTION
Status: DC | PRN
  Administered 2022-12-23: 1

## 2022-12-23 SURGICAL SUPPLY — 48 items
APPLIER CLIP 9.375 MED OPEN (MISCELLANEOUS) ×1
APPLIER CLIP 9.375 SM OPEN (CLIP) ×1
BAG BANDED W/RUBBER/TAPE 36X54 (MISCELLANEOUS) ×1 IMPLANT
BAG COUNTER SPONGE SURGICOUNT (BAG) ×1 IMPLANT
CANISTER SUCT 3000ML PPV (MISCELLANEOUS) ×1 IMPLANT
CATH BALLN ENROUTE 6X35 (CATHETERS) IMPLANT
CATH ROBINSON RED A/P 18FR (CATHETERS) IMPLANT
CLIP APPLIE 9.375 MED OPEN (MISCELLANEOUS) ×1 IMPLANT
CLIP APPLIE 9.375 SM OPEN (CLIP) ×1 IMPLANT
CLIP TI MEDIUM 6 (CLIP) ×1 IMPLANT
CLIP TI WIDE RED SMALL 6 (CLIP) ×1 IMPLANT
COVER DOME SNAP 22 D (MISCELLANEOUS) ×1 IMPLANT
COVER PROBE W GEL 5X96 (DRAPES) ×1 IMPLANT
DERMABOND ADVANCED .7 DNX12 (GAUZE/BANDAGES/DRESSINGS) ×1 IMPLANT
DRAPE FEMORAL ANGIO 80X135IN (DRAPES) ×1 IMPLANT
ELECT REM PT RETURN 9FT ADLT (ELECTROSURGICAL) ×1
ELECTRODE REM PT RTRN 9FT ADLT (ELECTROSURGICAL) ×1 IMPLANT
GOWN STRL REUS W/ TWL LRG LVL3 (GOWN DISPOSABLE) ×2 IMPLANT
GOWN STRL REUS W/TWL 2XL LVL3 (GOWN DISPOSABLE) ×2 IMPLANT
GUIDEWIRE ENROUTE 0.014 (WIRE) ×1 IMPLANT
HEMOSTAT SNOW SURGICEL 2X4 (HEMOSTASIS) IMPLANT
KIT BASIN OR (CUSTOM PROCEDURE TRAY) ×1 IMPLANT
KIT ENCORE 26 ADVANTAGE (KITS) ×1 IMPLANT
KIT INTRODUCER GALT 7 (INTRODUCER) ×1 IMPLANT
KIT TURNOVER KIT B (KITS) ×1 IMPLANT
NDL HYPO 25GX1X1/2 BEV (NEEDLE) IMPLANT
NEEDLE HYPO 25GX1X1/2 BEV (NEEDLE) IMPLANT
PACK CAROTID (CUSTOM PROCEDURE TRAY) ×1 IMPLANT
POSITIONER HEAD DONUT 9IN (MISCELLANEOUS) ×1 IMPLANT
PROTECTION STATION PRESSURIZED (MISCELLANEOUS) ×1
SET MICROPUNCTURE 5F STIFF (MISCELLANEOUS) ×1 IMPLANT
STATION PROTECTION PRESSURIZED (MISCELLANEOUS) ×1 IMPLANT
STENT TRANSCAROTID SYS 10X40 (Permanent Stent) IMPLANT
SUT MNCRL AB 4-0 PS2 18 (SUTURE) ×1 IMPLANT
SUT PROLENE 5 0 C 1 24 (SUTURE) ×1 IMPLANT
SUT PROLENE 6 0 BV (SUTURE) IMPLANT
SUT SILK 2 0 PERMA HAND 18 BK (SUTURE) IMPLANT
SUT SILK 2 0 SH (SUTURE) ×1 IMPLANT
SUT SILK 3-0 18XBRD TIE 12 (SUTURE) IMPLANT
SUT VIC AB 3-0 SH 27X BRD (SUTURE) ×1 IMPLANT
SYR 10ML LL (SYRINGE) ×3 IMPLANT
SYR 20ML LL LF (SYRINGE) ×1 IMPLANT
SYR CONTROL 10ML LL (SYRINGE) IMPLANT
SYSTEM TRANSCAROTID NEUROPRTCT (MISCELLANEOUS) ×1 IMPLANT
TOWEL GREEN STERILE (TOWEL DISPOSABLE) ×1 IMPLANT
TRANSCAROTID NEUROPROTECT SYS (MISCELLANEOUS) ×1
WATER STERILE IRR 1000ML POUR (IV SOLUTION) ×1 IMPLANT
WIRE BENTSON .035X145CM (WIRE) ×1 IMPLANT

## 2022-12-23 NOTE — Progress Notes (Signed)
SLP Cancellation Note  Patient Details Name: Opie Liao MRN: [id number] DOB: 07-04-1955   Cancelled treatment:       Reason Eval/Treat Not Completed: Patient at procedure or test/unavailable. Will continue to f/u.   Gwynneth Aliment, M.A., CF-SLP Speech Language Pathology, Acute Rehabilitation Services  Secure Chat preferred [phone]  12/23/2022, 9:19 AM

## 2022-12-23 NOTE — Anesthesia Procedure Notes (Signed)

## 2022-12-23 NOTE — Progress Notes (Signed)
PHARMACIST LIPID MONITORING   Deborah [name] is a 67 y.o. female admitted on 12/19/2022 with symptomatic ICA stenosis s/p TCAR.  Pharmacy has been consulted to optimize lipid-lowering therapy with the indication of secondary prevention for clinical ASCVD.  Recent Labs:  Lipid Panel (last 6 months):   Lab Results  Component Value Date   CHOL 206 (H) 12/20/2022   TRIG 87 12/20/2022   HDL 37 (L) 12/20/2022   CHOLHDL 5.6 12/20/2022   VLDL 17 12/20/2022   LDLCALC 152 (H) 12/20/2022    Hepatic function panel (last 6 months):   Lab Results  Component Value Date   AST 25 12/22/2022   ALT 24 12/22/2022   ALKPHOS 74 12/22/2022   BILITOT 0.8 12/22/2022    SCr (since admission):   Serum creatinine: 0.84 mg/dL [birth year]/08/[birth year] 7846 Estimated creatinine clearance: 66.1 mL/min  Current therapy and lipid therapy tolerance Current lipid-lowering therapy: atorvastatin 80mg , zetia Documented or reported allergies or intolerances to lipid-lowering therapies (if applicable): none   Plan:    1.Statin intensity (high intensity recommended for all patients regardless of the LDL):  No statin changes. The patient is already on a high intensity statin.  2.Add ezetimibe (if any one of the following):   Already on Zetia  3.Refer to lipid clinic:   Yes  4.Follow-up with:  Lipid clinic referral.  5.Follow-up labs after discharge:  No changes in lipid therapy, repeat a lipid panel in one year.     Harland German, PharmD Clinical Pharmacist **Pharmacist phone directory can now be found on [email or web address] ([email or web address]).  Listed under [facility].

## 2022-12-23 NOTE — Anesthesia Procedure Notes (Signed)
Arterial Line Insertion Start/End11/26/2024 6:55 AM, 12/23/2022 7:00 AM Performed by: Cy Blamer, CRNA, CRNA  Patient location: Pre-op. Preanesthetic checklist: patient identified, IV checked, site marked, risks and benefits discussed, surgical consent, monitors and equipment checked, pre-op evaluation, timeout performed and anesthesia consent Lidocaine 1% used for infiltration Right, radial was placed Catheter size: 20 G Hand hygiene performed  and maximum sterile barriers used  Allen's test indicative of satisfactory collateral circulation Attempts: 1 Procedure performed without using ultrasound guided technique. Following insertion, Biopatch and dressing applied. Post procedure assessment: normal and unchanged  Patient tolerated the procedure well with no immediate complications.

## 2022-12-23 NOTE — Progress Notes (Signed)
Inpatient Rehab Admissions Coordinator:    CIR following. She had TCAR today. May submit case to insurance for authorization after she works with therapy tomorrow. I am trying to reach Pt.'s granddaughter to confirm support but her number is in the chart and pt. Does not know her number. She states that granddaughter will likely visit this evening and she will give her my card and have her call me.   Megan Salon, MS, CCC-SLP Rehab Admissions Coordinator  [phone] (celll) [phone] (office)

## 2022-12-23 NOTE — Op Note (Addendum)
NAME: Deborah [name]    MRN: [id number] DOB: 02-Jun-1955    DATE OF OPERATION: 12/23/2022  PREOP DIAGNOSIS:    Right sided symptomatic ICA stenosis  POSTOP DIAGNOSIS:    Same  PROCEDURE:    Right-sided Transcarotid artery revascularization - 10x77mm stent  SURGEON: Victorino Sparrow  ASSIST: Kayren Eaves, PA  ANESTHESIA: General   EBL: 15ml  INDICATIONS:    [name] is a 67 y.o. female who presented with left-sided deficits, right-sided stroke.  Fortunately the majority of her symptoms have resolved, she continues to have some weakness and sensory deficit on the left side.  CT demonstrated right internal carotid artery with greater than 50% stenosis.  This was believed to be the symptomatic lesion.  After discussing risk benefits of cerebrovascular revascularization to prevent future stroke, [name] elected to proceed.  We discussed both carotid endarterectomy versus TCAR.  She elected to proceed with TCAR.  FINDINGS:   70% right ICA stenosis  TECHNIQUE:   The patient was brought to the operating room, where support lines were placed and general anesthesia was secured. The right neck and left groin were prepped and the patient was sterilely draped. A transverse 2-4 cm incision was made between the sternal and clavicular heads of the sternocleidomastoid muscle, below the omohyoid. Following longitudinal division of the carotid sheath the jugular vein was partially dissected and retracted medially. Once 3 cm of common carotid artery (CCA) were isolated, umbilical tape was placed around the proximal 1/3 of the CCA under direct vision. A 5.0 polypropylene suture was pre-placed in the anterior wall of the CCA, in a "U stitch" configuration, close to the clavicle to facilitate hemostasis upon removal of the arterial sheath at completion of the TCAR procedure.  The contralateral (left) common femoral vein (CFV) was accessed under ultrasound guidance, using standard Seldinger and  micropuncture access technique. Permanent recorded image(s) was/were saved in the patient's medical record. The Venous Return Sheath was advanced into the CFV over the 0.035" wire provided. Blood was aspirated from the flow line followed by flushing of the Venous Sheath with heparinized saline. The Venous Sheath was secured to the patient's skin with suture to maintain optimal position in the vessel.  Heparin was given to obtain a therapeutic activated clotting time >250 seconds prior to arterial access. A 4-French non-stiffened ENHANCE Transcarotid / Peripheral Access set was used, puncturing the artery with the 21G needle through the pre-placed "U" stitch while holding gentle traction on the umbilical tape to stabilize and centralize the CCA within the incision. Careful attention was paid to the change in CCA shape when using the umbilical tape to control or lift the artery. The micropuncture wire was then advanced 3-4 cm into the CCA and, the 21G needle was removed. The micropuncture sheath was advanced 2-3 cm into the CCA and the wire and dilator were removed. Pulsatile backflow indicated correct positioning. The provided 0.035" J-tipped guidewire was inserted as close as possible to the bifurcation without engaging the lesion. After micropuncture sheath removal, the Transcarotid Arterial Sheath was advanced to the 2.5cm marker and the 0.035" wire and dilator were then removed. Arterial Sheath position was assessed under fluoroscopy in two projections to ensure that the sheath tip was oriented coaxially in the CCA. The Arterial Sheath was sutured to the patient with gentle forward tension. Blood was slowly aspirated followed by flushing with heparinized saline. No ingress of air bubbles through the passive hemostatic valve was observed. The stopcocks were closed. Traction applied  to the CCA previously to facilitate access was gently released.  The Flow Controller was connected to the Transcarotid Arterial  Sheath, prepared by passively allowing a column of arterial blood to fill the line and connected to the Venous Return Sheath. CCA inflow was occluded proximal to the arteriotomy with a vascular clamp to achieve active flow reversal. To confirm flow reversal, a saline bolus was delivered into the venous flow line on both "High" and "Low" flow settings of the Flow Controller. Angiograms were performed with slow injections of a small amount of contrast filling just past the lesion to minimize antegrade transmission of micro-bubbles.  Prior to lesion manipulation, heart rate (70bpm) and systolic BP (140-114mmHg) were managed upwards to optimize flow reversal and procedural neuroprotection. The lesion was crossed with an 0.014" ENROUTE guidewire and pre-dilation of the lesion was performed with a 6mm x 30mm rapid exchange 0.014" compatible balloon catheter to 8 atmospheres for 10 seconds. Stenting was performed with an 10mm x 40mm ENROUTE Transcarotid stent, sized appropriately to the right CCA. AP angiogram (gentle contrast injections) were performed to confirm stent placement and arterial wall stent apposition.  There was a small amount of vasospasm appreciated distally.  This was not intervened upon as vasospasm was less than 50% stenotic.  At [facility] case completion, antegrade flow was restored by releasing the clamp on the CCA then closing the NPS stopcocks to the flow lines. The Transcarotid Arterial Sheath was removed and the pre-closure suture was tied. Heparin reversal was employed.  The Venous Return Sheath was removed and hemostasis was achieved with brief manual compression.  The patient tolerated the procedure well and was extubated on the table. The patient was moving all four extremities to command prior to transfer to the recovery room.   Victorino Sparrow, MD [facility] DATE OF DICTATION:   12/23/2022

## 2022-12-23 NOTE — Plan of Care (Signed)
  Problem: Education: Goal: Knowledge of General Education information will improve Description: Including pain rating scale, medication(s)/side effects and non-pharmacologic comfort measures Outcome: Progressing   Problem: Health Behavior/Discharge Planning: Goal: Ability to manage health-related needs will improve Outcome: Progressing   Problem: Clinical Measurements: Goal: Ability to maintain clinical measurements within normal limits will improve Outcome: Progressing Goal: Will remain free from infection Outcome: Progressing Goal: Diagnostic test results will improve Outcome: Progressing Goal: Respiratory complications will improve Outcome: Progressing Goal: Cardiovascular complication will be avoided Outcome: Progressing   Problem: Activity: Goal: Risk for activity intolerance will decrease Outcome: Progressing   Problem: Nutrition: Goal: Adequate nutrition will be maintained Outcome: Progressing   Problem: Coping: Goal: Level of anxiety will decrease Outcome: Progressing   Problem: Elimination: Goal: Will not experience complications related to bowel motility Outcome: Progressing Goal: Will not experience complications related to urinary retention Outcome: Progressing   Problem: Pain Management: Goal: General experience of comfort will improve Outcome: Progressing   Problem: Safety: Goal: Ability to remain free from injury will improve Outcome: Progressing   Problem: Skin Integrity: Goal: Risk for impaired skin integrity will decrease Outcome: Progressing   Problem: Education: Goal: Ability to describe self-care measures that may prevent or decrease complications (Diabetes Survival Skills Education) will improve Outcome: Progressing Goal: Individualized Educational Video(s) Outcome: Progressing   Problem: Coping: Goal: Ability to adjust to condition or change in health will improve Outcome: Progressing   Problem: Fluid Volume: Goal: Ability to  maintain a balanced intake and output will improve Outcome: Progressing   Problem: Health Behavior/Discharge Planning: Goal: Ability to identify and utilize available resources and services will improve Outcome: Progressing Goal: Ability to manage health-related needs will improve Outcome: Progressing   Problem: Metabolic: Goal: Ability to maintain appropriate glucose levels will improve Outcome: Progressing   Problem: Nutritional: Goal: Maintenance of adequate nutrition will improve Outcome: Progressing Goal: Progress toward achieving an optimal weight will improve Outcome: Progressing   Problem: Skin Integrity: Goal: Risk for impaired skin integrity will decrease Outcome: Progressing   Problem: Tissue Perfusion: Goal: Adequacy of tissue perfusion will improve Outcome: Progressing   Problem: Education: Goal: Knowledge of disease or condition will improve Outcome: Progressing Goal: Knowledge of secondary prevention will improve (MUST DOCUMENT ALL) Outcome: Progressing Goal: Knowledge of patient specific risk factors will improve Loraine Leriche N/A or DELETE if not current risk factor) Outcome: Progressing   Problem: Ischemic Stroke/TIA Tissue Perfusion: Goal: Complications of ischemic stroke/TIA will be minimized Outcome: Progressing   Problem: Coping: Goal: Will verbalize positive feelings about self Outcome: Progressing Goal: Will identify appropriate support needs Outcome: Progressing   Problem: Health Behavior/Discharge Planning: Goal: Ability to manage health-related needs will improve Outcome: Progressing Goal: Goals will be collaboratively established with patient/family Outcome: Progressing   Problem: Self-Care: Goal: Ability to participate in self-care as condition permits will improve Outcome: Progressing Goal: Verbalization of feelings and concerns over difficulty with self-care will improve Outcome: Progressing Goal: Ability to communicate needs accurately  will improve Outcome: Progressing   Problem: Nutrition: Goal: Risk of aspiration will decrease Outcome: Progressing Goal: Dietary intake will improve Outcome: Progressing   Problem: Education: Goal: Knowledge of discharge needs will improve Outcome: Progressing   Problem: Clinical Measurements: Goal: Postoperative complications will be avoided or minimized Outcome: Progressing   Problem: Respiratory: Goal: Ability to achieve and maintain a regular respiratory rate will improve Outcome: Progressing   Problem: Skin Integrity: Goal: Demonstration of wound healing without infection will improve Outcome: Progressing

## 2022-12-23 NOTE — Transfer of Care (Signed)
Immediate Anesthesia Transfer of Care Note  Patient: Deborah [name]  Procedure(s) Performed: TRANSCAROTID ARTERY REVASCULARIZATION (Right: Neck)  Patient Location: PACU  Anesthesia Type:General  Level of Consciousness: awake, alert , and oriented  Airway & Oxygen Therapy: Patient Spontanous Breathing and Patient connected to nasal cannula oxygen  Post-op Assessment: Report given to RN, Post -op Vital signs reviewed and stable, Patient moving all extremities X 4, and Patient able to stick tongue midline  Post vital signs: Reviewed and stable  Last Vitals:  Vitals Value Taken Time  BP 140/60 12/23/22 0932  Temp 98.6   Pulse 71 12/23/22 0939  Resp 15 12/23/22 0939  SpO2 97 % 12/23/22 0939  Vitals shown include unfiled device data.  Last Pain:  Vitals:   12/23/22 0723  TempSrc: Oral  PainSc:          Complications: No notable events documented.

## 2022-12-23 NOTE — Progress Notes (Signed)
    Called by RN with report of HR decrease below 50 bpm  A line has a wip I moved the BP cuff to the right leg BP  128/61   No intervention was warranted.  She is alert and oriented.

## 2022-12-23 NOTE — Progress Notes (Signed)
Speech Language Pathology Treatment: Cognitive-Linquistic  Patient Details Name: Deborah [name] MRN: [id number] DOB: 24-Jul-1955 Today's Date: 12/23/2022 Time: 4010-2725 SLP Time Calculation (min) (ACUTE ONLY): 16 min  Assessment / Plan / Recommendation Clinical Impression  Pt eager to participate this date in treatment activity targeting cognition. Facilitated medication management activity with pt identifying errors in all opportunities given Mod verbal cueing. She required redirection to target and task multiple times throughout the session and appeared to frequently confuse targets. Recommend ongoing intensive SLP f/u given pt's PLOF. Will continue to f/u acutely as well.    HPI HPI: Deborah [name] is a 67 yo female presenting to ED 11/23 with L sided numbness. MRI Brain with acute infarcts involving the R caudate, lentiform nucleus as well as R frontal and parietal cortex. CTA with R ICA stenosis. Previously seen by SLP 02/2020 and scored 25/30 on the SLUMS. PMH includes T2DM, HTN, breast cancer in remission, prior CVA, GERD      SLP Plan  Continue with current plan of care      Recommendations for follow up therapy are one component of a multi-disciplinary discharge planning process, led by the attending physician.  Recommendations may be updated based on patient status, additional functional criteria and insurance authorization.    Recommendations                     Oral care BID   Frequent or constant Supervision/Assistance Cognitive communication deficit (D66.440)     Continue with current plan of care     Gwynneth Aliment, M.A., CF-SLP Speech Language Pathology, Acute Rehabilitation Services  Secure Chat preferred [phone]   12/23/2022, 3:42 PM

## 2022-12-23 NOTE — Progress Notes (Addendum)
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  Goals of Care:   Code Status: Full Code   DVT prophylaxis: enoxaparin (LOVENOX) injection 40 mg Start: 12/24/22 1000 SCD's Start: 12/23/22 1043  Interim Hx: To the OR for TCAR today.  No acute events recorded overnight.  Afebrile.  Vital signs stable.  The patient is seen in her room postoperatively.  She remains mildly groggy from anesthesia but is interactive and moving all 4 extremities spontaneously.  She denies any new complaints.  She denies shortness of breath or chest pain.  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 just distal to its origin with unchanged 50% stenosis of the proximal right ICA at the bifurcation with no other intracranial large vessel occlusion -TTE notes EF 45-50% with global hypokinesis and mild concentric left ventricular hypertrophy and normal RV with no obvious intracardiac source of embolization -medical treatment: Aspirin plus Plavix -LDL 152 -A1c 6.9 -PT/OT/SLP -Vascular surgery evaluation of right carotid artery as clinically it is felt this is most likely significantly greater than 50% stenosis and the etiology of her strokes  Right carotid artery stenosis CTA head and neck this  admission reported 50% right ICA stenosis but Neurology felt this was more consistent with 65-70% to their interpretation, and clinically her situation was concerning for a carotid source of her CVAs - Vascular Surgery performed a L TCAR 11/26  Chest pain with LBBB Troponin very reassuring at 10 x 2 - EKG not helpful due to LBBB, with comment that EKG approximately 2 years ago did display findings consistent with LVH suggesting that LBBB on her current EKG is simply due to progression of this LVH - no focal WMA on TTE -no history of angina prior to this admission, and patient's description of pain is not consistent with [place] PE - I do not feel that any further risk stratification is required as an inpatient   Remote history of breast cancer Has been in remission for 11 years  Controlled DM2 A1c 6.9 -CBG well-controlled  HTN Blood pressure goal 130-160 -stable presently within this range  HLD LDL poorly controlled reportedly on Crestor - changed to high-dose Lipitor this admission -continue Zetia   Family Communication: Spoke with multiple grandchildren in room at time of visit Disposition: Awaiting CIR bed placement   Objective: Blood pressure 126/63, pulse (!) 43, temperature 98.1 F (36.7 C), resp. rate 18, height 5\' 1"  (1.549 m), weight 85.1 kg, SpO2 98%.  Intake/Output Summary (Last 24 hours) at 12/23/2022 1644 Last data filed at 12/23/2022 0912 Gross per 24 hour  Intake 400 ml  Output 610 ml  Net -210 ml   Filed Weights   12/19/22 2300 12/20/22 0400  Weight: 85.1 kg 85.1 kg    Examination: General: No acute respiratory distress Lungs: Clear to auscultation bilaterally without wheezing  or focal crackles Cardiovascular: Regular rate and rhythm without murmur Abdomen: Nontender, nondistended, soft, bowel sounds positive, no rebound, no ascites, no appreciable mass Extremities: No significant cyanosis, clubbing, or edema bilateral lower extremities   CBC: Recent Labs   Lab 12/19/22 1715 12/22/22 0351 12/23/22 0421  WBC 7.9 6.3 5.6  NEUTROABS 5.2  --   --   HGB 14.1 12.1 12.3  HCT 43.8 36.8 37.4  MCV 87.4 87.2 87.0  PLT 231 195 202   Basic Metabolic Panel: Recent Labs  Lab 12/19/22 1715 12/22/22 0351 12/23/22 0421  NA 138 133* 136  K 4.2 3.6 3.9  CL 103 102 103  CO2 25 23 25   GLUCOSE 142* 107* 104*  BUN 15 12 15   CREATININE 0.94 0.77 0.84  CALCIUM 9.3 8.7* 9.2  MG  --  1.8  --    GFR: Estimated Creatinine Clearance: 66.1 mL/min (by C-G formula based on SCr of 0.84 mg/dL).   Scheduled Meds:  [START ON 12/24/2022] aspirin EC  81 mg Oral Daily   atorvastatin  80 mg Oral Daily   [START ON 12/24/2022] clopidogrel  75 mg Oral Daily   [START ON 12/24/2022] docusate sodium  100 mg Oral Daily   [START ON 12/24/2022] enoxaparin (LOVENOX) injection  40 mg Subcutaneous Q24H   ezetimibe  10 mg Oral Daily   insulin aspart  0-15 Units Subcutaneous TID WC   insulin aspart  0-5 Units Subcutaneous QHS   pantoprazole  40 mg Oral Daily     LOS: 3 days   Lonia Blood, MD [facility] Office  [phone] Pager - Text Page per Loretha Stapler  If 7PM-7AM, please contact night-coverage per Amion 12/23/2022, 4:44 PM

## 2022-12-23 NOTE — Progress Notes (Signed)
  Progress Note    12/23/2022 7:17 AM   Subjective:  Nervous, otherwise feels fine. Tired   Vitals:   12/22/22 2328 12/23/22 0441  BP: 131/83 130/77  Pulse: (!) 55 61  Resp: 17 15  Temp: 98.2 F (36.8 C) 98.5 F (36.9 C)  SpO2: 95% 96%    Physical Exam: General:  no distress Lungs:  non labored   CBC    Component Value Date/Time   WBC 5.6 12/23/2022 0421   RBC 4.30 12/23/2022 0421   HGB 12.3 12/23/2022 0421   HCT 37.4 12/23/2022 0421   PLT 202 12/23/2022 0421   MCV 87.0 12/23/2022 0421   MCH 28.6 12/23/2022 0421   MCHC 32.9 12/23/2022 0421   RDW 15.2 12/23/2022 0421   LYMPHSABS 1.9 12/19/2022 1715   MONOABS 0.7 12/19/2022 1715   EOSABS 0.0 12/19/2022 1715   BASOSABS 0.0 12/19/2022 1715    BMET    Component Value Date/Time   NA 136 12/23/2022 0421   K 3.9 12/23/2022 0421   CL 103 12/23/2022 0421   CO2 25 12/23/2022 0421   GLUCOSE 104 (H) 12/23/2022 0421   BUN 15 12/23/2022 0421   CREATININE 0.84 12/23/2022 0421   CALCIUM 9.2 12/23/2022 0421   GFRNONAA >60 12/23/2022 0421   GFRAA >60 03/16/2019 1523    INR    Component Value Date/Time   INR 0.9 12/19/2022 1715     Intake/Output Summary (Last 24 hours) at 12/23/2022 0717 Last data filed at 12/22/2022 2140 Gross per 24 hour  Intake --  Output 600 ml  Net -600 ml     Assessment/Plan:  67 y.o. female with symptomatic right ICA stenosis  Hospital Day 2  Pt scheduled for right TCAR  Was able to discuss surgery with her and her sister again today.  After discussing the risk and benefits, Myrle elected to proceed.    Victorino Sparrow MD [facility] Phone Number: [phone] 12/23/2022 7:17 AM

## 2022-12-23 NOTE — Anesthesia Postprocedure Evaluation (Signed)
Anesthesia Post Note  Patient: Deborah [name]  Procedure(s) Performed: TRANSCAROTID ARTERY REVASCULARIZATION (Right: Neck)     Anesthesia Type: General Anesthetic complications: no   No notable events documented.  Last Vitals:  Vitals:   12/23/22 1400 12/23/22 1415  BP: 95/63   Pulse: (!) 51 (!) 45  Resp: 18 16  Temp:    SpO2: 97% 95%    Last Pain:  Vitals:   12/23/22 1210  TempSrc:   PainSc: 2                  [place]

## 2022-12-24 ENCOUNTER — Encounter (HOSPITAL_COMMUNITY): Payer: Self-pay | Admitting: Vascular Surgery

## 2022-12-24 DIAGNOSIS — I639 Cerebral infarction, unspecified: Secondary | ICD-10-CM | POA: Diagnosis not present

## 2022-12-24 DIAGNOSIS — Z9889 Other specified postprocedural states: Secondary | ICD-10-CM

## 2022-12-24 DIAGNOSIS — Z95828 Presence of other vascular implants and grafts: Secondary | ICD-10-CM

## 2022-12-24 LAB — CBC
HCT: 32.9 % — ABNORMAL LOW (ref 36.0–46.0)
Hemoglobin: 10.8 g/dL — ABNORMAL LOW (ref 12.0–15.0)
MCH: 28.6 pg (ref 26.0–34.0)
MCHC: 32.8 g/dL (ref 30.0–36.0)
MCV: 87 fL (ref 80.0–100.0)
Platelets: 190 10*3/uL (ref 150–400)
RBC: 3.78 MIL/uL — ABNORMAL LOW (ref 3.87–5.11)
RDW: 15.4 % (ref 11.5–15.5)
WBC: 11.1 10*3/uL — ABNORMAL HIGH (ref 4.0–10.5)
nRBC: 0 % (ref 0.0–0.2)

## 2022-12-24 LAB — GLUCOSE, CAPILLARY
Glucose-Capillary: 115 mg/dL — ABNORMAL HIGH (ref 70–99)
Glucose-Capillary: 125 mg/dL — ABNORMAL HIGH (ref 70–99)
Glucose-Capillary: 79 mg/dL (ref 70–99)
Glucose-Capillary: 148 mg/dL — ABNORMAL HIGH (ref 70–99)

## 2022-12-24 LAB — BASIC METABOLIC PANEL
Anion gap: 10 (ref 5–15)
BUN: 12 mg/dL (ref 8–23)
CO2: 21 mmol/L — ABNORMAL LOW (ref 22–32)
Calcium: 8.8 mg/dL — ABNORMAL LOW (ref 8.9–10.3)
Chloride: 109 mmol/L (ref 98–111)
Creatinine, Ser: 1.01 mg/dL — ABNORMAL HIGH (ref 0.44–1.00)
GFR, Estimated: >60 mL/min (ref >60)
Glucose, Bld: 126 mg/dL — ABNORMAL HIGH (ref 70–99)
Potassium: 4.2 mmol/L (ref 3.5–5.1)
Sodium: 140 mmol/L (ref 135–145)

## 2022-12-24 NOTE — Progress Notes (Addendum)
  Progress Note    12/24/2022 7:50 AM 1 Day Post-Op  Subjective:  feels okay this morning. Says her throat feels a little sore    Vitals:   12/24/22 0416 12/24/22 0700  BP: (!) 101/52 112/62  Pulse: (!) 42 (!) 48  Resp: 20 17  Temp: 97.9 F (36.6 C)   SpO2: 96% 95%    Physical Exam: General:  resting comfortably, A&O x4 Cardiac:  regular Lungs:  nonlabored Incisions:  right sided neck incision c/d/l without hematoma Extremities:  moving all extremities equally Neuro: no focal weakness or numbness, no slurred speech or lip droop  CBC    Component Value Date/Time   WBC 11.1 (H) 12/24/2022 0350   RBC 3.78 (L) 12/24/2022 0350   HGB 10.8 (L) 12/24/2022 0350   HCT 32.9 (L) 12/24/2022 0350   PLT 190 12/24/2022 0350   MCV 87.0 12/24/2022 0350   MCH 28.6 12/24/2022 0350   MCHC 32.8 12/24/2022 0350   RDW 15.4 12/24/2022 0350   LYMPHSABS 1.9 12/19/2022 1715   MONOABS 0.7 12/19/2022 1715   EOSABS 0.0 12/19/2022 1715   BASOSABS 0.0 12/19/2022 1715    BMET    Component Value Date/Time   NA 140 12/24/2022 0350   K 4.2 12/24/2022 0350   CL 109 12/24/2022 0350   CO2 21 (L) 12/24/2022 0350   GLUCOSE 126 (H) 12/24/2022 0350   BUN 12 12/24/2022 0350   CREATININE 1.01 (H) 12/24/2022 0350   CALCIUM 8.8 (L) 12/24/2022 0350   GFRNONAA >60 12/24/2022 0350   GFRAA >60 03/16/2019 1523    INR    Component Value Date/Time   INR 0.9 12/19/2022 1715     Intake/Output Summary (Last 24 hours) at 12/24/2022 0750 Last data filed at 12/23/2022 1937 Gross per 24 hour  Intake 540 ml  Output 560 ml  Net -20 ml      Assessment/Plan:  67 y.o. female is 1 day post op, s/p: right TCAR   -Patient is doing well this morning without any neurological events post op -She has minimal pain at her incision site. Her incision is dry, intact, and soft without hematoma -She is neurologically intact. She is moving all extremities equally. No slurred speech, lip droop, or tongue  deviation -Hemoglobin is stable at 10.8 without evidence of further blood loss -Was able to eat dinner last night without swallowing difficulty -Continue ASA, plavix, and statin. Okay for d/c from a vascular standpoint. Will arrange follow up in 4 weeks   Loel Dubonnet PA-C [facility] [phone] 12/24/2022 7:50 AM  I have seen and evaluated the patient. I agree with the PA note as documented above.  Postop day 1 status post right TCAR for symptomatic ICA stenosis.  Neck incision looks good.  At her neurologic baseline with some left-sided sensory deficit mainly.  Okay for discharge from a vascular surgery standpoint.  Discussed aspirin statin Plavix at discharge.  Will arrange follow-up in 1 month with carotid duplex with Dr. Karin Lieu.  Cephus Shelling, MD [facility] [place] Office: [phone]

## 2022-12-24 NOTE — Plan of Care (Signed)

## 2022-12-24 NOTE — Progress Notes (Addendum)
Mobility Specialist Progress Note:   12/24/22 1250  Mobility  Activity Ambulated with assistance in hallway  Level of Assistance Contact guard assist, steadying assist  Assistive Device None  Distance Ambulated (ft) 115 ft  Activity Response Tolerated well  Mobility Referral Yes  $Mobility charge 1 Mobility  Mobility Specialist Start Time (ACUTE ONLY) 1236  Mobility Specialist Stop Time (ACUTE ONLY) 1248  Mobility Specialist Time Calculation (min) (ACUTE ONLY) 12 min   Pre Mobility: 48 HR  During Mobility: 78 HR  Post Mobility: 46 HR   Pt received in chair, agreeable to mobility. SB to stand. CG during ambulation. Pt c/o slight neck pain prior to ambulation, otherwise asx throughout. Slightly unsteady while returning to room but no LOB present.  Pt returned to chair with call bell in reach and all needs met.   Leory Plowman  Mobility Specialist Please contact via [employer] office at [phone]

## 2022-12-24 NOTE — Progress Notes (Signed)
Occupational Therapy Treatment Patient Details Name: Deborah [name] MRN: [id number] DOB: [birth year] Today's Date: 12/24/2022   History of present illness Pt is 67 yo presenting to [facility] with reports of L sided numbness that was present on awakening on 11/22. 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 Pt underwent rt TCAR on 12/23/22. 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   OT comments  OT session focused on improving attention to Left side of body and environment to increase safety and independence with functional tasks. Pt currently requires mod cues and increased time to locate items on table in the environment and for sustained use of L UE during functional and therapeutic tasks. Pt with continued poor insight into deficits and how deficits impact safety and independence with functional tasks and demonstrating decreased short term memory with pt unable to report training from earlier in the session after approx. 5 minutes. 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 Pt will benefit from continued acute skilled OT services to address deficits outlined below and increase safety and independence with functional tasks. Post acute discharge, pt will benefit from intensive inpatient skilled rehab services > 3 hours per day to maximize rehab potential.        If plan is discharge home, recommend the following:  A little help with walking and/or transfers;A lot of help with bathing/dressing/bathroom;Assistance with cooking/housework;Assistance with feeding;Direct supervision/assist for medications management;Direct supervision/assist for financial management;Assist for transportation;Help with stairs or ramp for entrance;Supervision due to cognitive status   Equipment Recommendations  Other (comment) (defer to next level of care)    Recommendations for Other  Services Rehab consult    Precautions / Restrictions Precautions Precautions: Fall Restrictions Weight Bearing Restrictions: No       Mobility Bed Mobility Overal bed mobility: Needs Assistance Bed Mobility: Supine to Sit, Sit to Supine     Supine to sit: Supervision Sit to supine: Contact guard assist   General bed mobility comments: Assist for safety. Incr time to perform. Cues for attention to L side and use of L UE during bed mobility.    Transfers Overall transfer level: Needs assistance                 General transfer comment: not addressed this session     Balance Overall balance assessment: Needs assistance Sitting-balance support: No upper extremity supported, Feet supported Sitting balance-Leahy Scale: Fair                                     ADL either performed or assessed with clinical judgement   ADL Overall ADL's : Needs assistance/impaired Eating/Feeding: Supervision/ safety;Cueing for compensatory techinques;Sitting Eating/Feeding Details (indicate cue type and reason): Cues for locating items on Left side of table. Cues to use L UE to assist during task.                                   General ADL Comments: OT session focused on improviing attention to Left side and education in techniques for compensating for Left side neglect, including lighthouse scanning. Pt currently requires cues to locate items on Left side of table and in Left field of vision in the environment. For example, pt positioned with window  on Left side. OT asked pt what the weather was like to day with pt stating she did not know and requiring cues to look out the window in order to report weather to OT. Pt with noted short-term memory deficits and poor insight into deficits with pt unable to report back education regarding lighthouse scanning approx. 5 minutes after training and with pt reporting at end of session that her vision is "much better  now" and reporting no Left side deficts.    Extremity/Trunk Assessment Upper Extremity Assessment Upper Extremity Assessment: Right hand dominant;Generalized weakness;RUE deficits/detail;LUE deficits/detail RUE Deficits / Details: generalized weakness; decreased fine motor coordination RUE Coordination: decreased fine motor LUE Deficits / Details: L inattention/neglect with decreased functional use during ADL. decreased proprioception with overshooting noted during functional use; generalized weakness; decreased AROM shoulder shoulder flexion; PROM shoulder flexion WFL but with pt reporting soreness; decreased propreoception; decreased fine and gross motor coordinaiton LUE Sensation: decreased light touch;decreased proprioception LUE Coordination: decreased fine motor;decreased gross motor   Lower Extremity Assessment Lower Extremity Assessment: Defer to PT evaluation        Vision       Perception     Praxis      Cognition Arousal: Alert Behavior During Therapy: WFL for tasks assessed/performed Overall Cognitive Status: Impaired/Different from baseline Area of Impairment: Attention, Memory, Following commands, Safety/judgement, Awareness, Problem solving                   Current Attention Level: Sustained Memory: Decreased short-term memory Following Commands: Follows multi-step commands with increased time Safety/Judgement: Decreased awareness of safety, Decreased awareness of deficits Awareness: Emergent Problem Solving: Slow processing, Difficulty sequencing, Requires verbal cues General Comments: Pt with Lef tside neglect and poor insight into deficits with pt demonstrating poor carryover in training regarding attention to L side/strategies to compensate for L side neglect from session to session.        Exercises      Shoulder Instructions       General Comments VSS on RA throughout session.    Pertinent Vitals/ Pain       Pain Assessment Pain  Assessment: No/denies pain  Home Living                                          Prior Functioning/Environment              Frequency  Min 1X/week        Progress Toward Goals  OT Goals(current goals can now be found in the care plan section)  Progress towards OT goals: Progressing toward goals  Acute Rehab OT Goals Patient Stated Goal: to return home with her children and grandchildren  Plan      Co-evaluation                 AM-PAC OT "6 Clicks" Daily Activity     Outcome Measure   Help from another person eating meals?: A Little Help from another person taking care of personal grooming?: A Lot Help from another person toileting, which includes using toliet, bedpan, or urinal?: A Lot Help from another person bathing (including washing, rinsing, drying)?: A Lot Help from another person to put on and taking off regular upper body clothing?: A Little Help from another person to put on and taking off regular lower body clothing?: A Lot 6 Click Score: 14  End of Session    OT Visit Diagnosis: Other symptoms and signs involving cognitive function;Other (comment);Ataxia, unspecified (R27.0) (Other vision issues; Left side neglect)   Activity Tolerance Patient tolerated treatment well   Patient Left in bed;with call bell/phone within reach;with bed alarm set   Nurse Communication Mobility status;Other (comment) (Pt participating in training to address L side neglect but with continued poor insight into deficits.)        Time: 1610-9604 OT Time Calculation (min): 16 min  Charges: OT General Charges $OT Visit: 1 Visit OT Treatments $Therapeutic Activity: 8-22 mins  Rayelynn Loyal "Orson Eva., OTR/L, MA Acute Rehab [phone]   Lendon Colonel 12/24/2022, 6:03 PM

## 2022-12-24 NOTE — Progress Notes (Signed)
Physical Therapy Treatment Patient Details Name: Deborah [name] MRN: [id number] DOB: [birth year] Today's Date: 12/24/2022   History of Present Illness Pt is 67 yo presenting to [facility] with reports of L sided numbness that was present on awakening on 11/22. 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 Pt underwent rt TCAR on 12/23/22. 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    PT Comments  Pt making steady progress with mobility. Continues to demonstrate lt inattention and decr insight into deficits in addition to mobility deficits. Patient will benefit from intensive inpatient follow up therapy, >3 hours/day in order to maximize her independence prior to returning home with 8 grandchildren under her care.      If plan is discharge home, recommend the following: A little help with walking and/or transfers;Assistance with cooking/housework;Assist for transportation;Supervision due to cognitive status   Can travel by private vehicle        Equipment Recommendations  Other (comment) (To be determined)    Recommendations for Other Services       Precautions / Restrictions Precautions Precautions: Fall Restrictions Weight Bearing Restrictions: No     Mobility  Bed Mobility Overal bed mobility: Needs Assistance Bed Mobility: Supine to Sit     Supine to sit: Supervision     General bed mobility comments: Assist for safety. Incr time to perform    Transfers Overall transfer level: Needs assistance Equipment used: Rolling walker (2 wheels), Rollator (4 wheels), None Transfers: Sit to/from Stand Sit to Stand: Min assist           General transfer comment: Assist for balance. Verbal cues for hand placement.    Ambulation/Gait Ambulation/Gait assistance: Min assist Gait Distance (Feet): 150 Feet (150 x 2, 20 x 1) Assistive device: Rolling walker (2 wheels), Rollator (4 wheels), None Gait  Pattern/deviations: Step-through pattern, Decreased step length - left, Decreased dorsiflexion - left, Drifts right/left Gait velocity: decreased Gait velocity interpretation: 1.31 - 2.62 ft/sec, indicative of limited community ambulator   General Gait Details: Assist for balance and safety and to attend to obstacles on left. Twice when asking pt to make a U turn in hallway pt turned 90 degrees to the left and began walking down other hall instead of turning all the way around. Pt with difficulty manuvering 2 wheeled walker and did better with rollator or without device   Stairs             Wheelchair Mobility     Tilt Bed    Modified Rankin (Stroke Patients Only) Modified Rankin (Stroke Patients Only) Pre-Morbid Rankin Score: No symptoms Modified Rankin: Moderately severe disability     Balance Overall balance assessment: Needs assistance Sitting-balance support: No upper extremity supported, Feet supported Sitting balance-Leahy Scale: Fair     Standing balance support: Bilateral upper extremity supported Standing balance-Leahy Scale: Poor Standing balance comment: walker and CGA for static standing                            Cognition Arousal: Alert Behavior During Therapy: WFL for tasks assessed/performed Overall Cognitive Status: Impaired/Different from baseline Area of Impairment: Attention, Memory, Following commands, Safety/judgement, Awareness, Problem solving                   Current Attention Level: Sustained Memory: Decreased short-term memory Following Commands: Follows multi-step commands with increased time Safety/Judgement: Decreased awareness of safety, Decreased awareness of  deficits Awareness: Emergent Problem Solving: Slow processing, Difficulty sequencing, Requires verbal cues General Comments: Pt with decr insight into deficits        Exercises      General Comments        Pertinent Vitals/Pain Pain  Assessment Pain Assessment: Faces Faces Pain Scale: Hurts a little bit Pain Location: throat Pain Descriptors / Indicators: Discomfort    Home Living                          Prior Function            PT Goals (current goals can now be found in the care plan section) Progress towards PT goals: Progressing toward goals    Frequency    Min 1X/week      PT Plan      Co-evaluation              AM-PAC PT "6 Clicks" Mobility   Outcome Measure  Help needed turning from your back to your side while in a flat bed without using bedrails?: A Little Help needed moving from lying on your back to sitting on the side of a flat bed without using bedrails?: A Little Help needed moving to and from a bed to a chair (including a wheelchair)?: A Little Help needed standing up from a chair using your arms (e.g., wheelchair or bedside chair)?: A Little Help needed to walk in hospital room?: A Little Help needed climbing 3-5 steps with a railing? : Total 6 Click Score: 16    End of Session Equipment Utilized During Treatment: Gait belt Activity Tolerance: Patient tolerated treatment well Patient left: in chair;with call bell/phone within reach;with chair alarm set Nurse Communication: Mobility status PT Visit Diagnosis: Other abnormalities of gait and mobility (R26.89);Hemiplegia and hemiparesis Hemiplegia - Right/Left: Left Hemiplegia - dominant/non-dominant: Non-dominant Hemiplegia - caused by: Cerebral infarction     Time: 1610-9604 PT Time Calculation (min) (ACUTE ONLY): 28 min  Charges:    $Gait Training: 23-37 mins PT General Charges $$ ACUTE PT VISIT: 1 Visit                     [facility] PT Acute Rehabilitation Services Office [phone]    Angelina Ok [facility] 12/24/2022, 11:06 AM

## 2022-12-24 NOTE — Progress Notes (Signed)
Speech Language Pathology Treatment: Cognitive-Linquistic  Patient Details Name: Deborah [name] MRN: [id number] DOB: 1955-02-02 Today's Date: 12/24/2022 Time: 9629-5284 SLP Time Calculation (min) (ACUTE ONLY): 8 min  Assessment / Plan / Recommendation Clinical Impression  Pt seen for skilled ST treatment targeting cognitive linguistic goals. Facilitated participation in a sequencing task, during which pt listed all the steps to cook her favorite meal given Min verbal cueing for sustained attention and redirection. Overall, pt sequenced steps appropriately but does have increased difficulty identifying errors and self-correcting. She is eager to get back to caring for her eight grandchildren, but will require continued intensive SLP f/u before doing so, >3 hours/day.    HPI HPI: Deborah [name] is a 67 yo female presenting to ED 11/23 with L sided numbness. MRI Brain with acute infarcts involving the R caudate, lentiform nucleus as well as R frontal and parietal cortex. CTA with R ICA stenosis. Previously seen by SLP 02/2020 and scored 25/30 on the SLUMS. PMH includes T2DM, HTN, breast cancer in remission, prior CVA, GERD      SLP Plan  Continue with current plan of care      Recommendations for follow up therapy are one component of a multi-disciplinary discharge planning process, led by the attending physician.  Recommendations may be updated based on patient status, additional functional criteria and insurance authorization.    Recommendations                     Oral care BID   Frequent or constant Supervision/Assistance Cognitive communication deficit (X32.440)     Continue with current plan of care     Gwynneth Aliment, M.A., CF-SLP Speech Language Pathology, Acute Rehabilitation Services  Secure Chat preferred [phone]   12/24/2022, 12:10 PM

## 2022-12-24 NOTE — Progress Notes (Signed)
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  She was found to have acute right-sided CVA with right ICA stenosis.  Neurology was consulted.  Vascular surgery also evaluated patient due to right carotid artery stenosis.  Patient ultimately underwent TCAR 11/26 by Dr. Karin Lieu.   New events last 24 hours / Subjective: Patient feeling well today.  Feelings of numbness has now resolved.  Continues to have left lower extremity weakness.  Is able to ambulate very slowly.  Has some sore throat, which is expected postoperatively.  Assessment & Plan:   Principal Problem:   Acute ischemic stroke [facility]) Active Problems:   Controlled type 2 diabetes mellitus with hyperglycemia (HCC)   NICM (nonischemic cardiomyopathy) (HCC)   History of cancer   CVA (cerebral vascular accident) (HCC)    Acute ischemic anterior circulation right brain CVA's -embolic versus watershed -Resultant left-sided numbness -MRI brain noted acute infarcts involving the right caudate, lentiform nucleus, right frontal, and right parietal cortex and white matter with no hemorrhage or mass effect as well as evidence of slow flow/occlusion of the extracranial left vertebral artery -CTA head and neck redemonstrated occlusion of the left vertebral artery just distal to its origin with unchanged 50% stenosis of the proximal right ICA at the bifurcation with no other intracranial large vessel occlusion -TTE notes EF 45-50% with global hypokinesis and mild concentric left ventricular hypertrophy and normal RV with no obvious intracardiac source of embolization -LDL 152 -A1c 6.9 -PT/OT  recommending CIR placement -Aspirin, Plavix, Lipitor   Right carotid artery stenosis -Status post right-sided Transcarotid artery revascularization - 10x29mm stent on 11/26 -Follow-up with vascular surgery in 1 month with carotid duplex with Dr. Karin Lieu   Chest pain with LBBB -Chest pain resolved.  Troponin negative   Remote history of breast cancer -Has been in remission for 11 years   Controlled DM2 -A1c 6.9 -Sliding scale insulin  HTN -Stable   HLD -Lipitor, Zetia     DVT prophylaxis:  enoxaparin (LOVENOX) injection 40 mg Start: 12/24/22 1000 SCD's Start: 12/23/22 1043  Code Status: Full code Family Communication: None at bedside Disposition Plan: CIR Status is: Inpatient Remains inpatient appropriate because: CIR placement is pending    Antimicrobials:  Anti-infectives (From admission, onward)    Start     Dose/Rate Route Frequency Ordered Stop   12/23/22 1400  ceFAZolin (ANCEF) IVPB 2g/100 mL premix        2 g 200 mL/hr over 30 Minutes Intravenous Every 8 hours 12/23/22 1043 12/23/22 2230   12/23/22 0600  ceFAZolin (ANCEF) IVPB 2g/100 mL premix       Note to Pharmacy: Send with pt to OR   2 g 200 mL/hr over 30 Minutes Intravenous On call 12/22/22 1501 12/23/22 0807        Objective: Vitals:   12/24/22 1000 12/24/22 1121 12/24/22 1200 12/24/22 1300  BP:  (!) 95/53    Pulse: (!) 42 (!) 45 (!) 43 (!) 43  Resp: 15 16 11 16   Temp:  98 F (36.7 C)    TempSrc:  Oral    SpO2: 98% 98% 96% 98%  Weight:      Height:        Intake/Output Summary (Last 24 hours) at 12/24/2022 1338 Last data filed at 12/23/2022 1937 Gross per 24 hour  Intake 240 ml  Output 550 ml  Net -310 ml   Filed Weights   12/19/22 2300 12/20/22 0400  Weight: 85.1 kg 85.1 kg    Examination:  General exam: Appears calm and comfortable  Respiratory system: Clear to auscultation. Respiratory effort normal. No respiratory distress. No conversational dyspnea.  Cardiovascular  system: S1 & S2 heard, RRR. No murmurs. No pedal edema. Gastrointestinal system: Abdomen is nondistended, soft and nontender. Normal bowel sounds heard. Central nervous system: Alert and oriented. No focal neurological deficits. Speech clear.  Extremities: Symmetric in appearance  Skin: No rashes, lesions or ulcers on exposed skin  Psychiatry: Judgement and insight appear normal. Mood & affect appropriate.   Data Reviewed: I have personally reviewed following labs and imaging studies  CBC: Recent Labs  Lab 12/19/22 1715 12/22/22 0351 12/23/22 0421 12/24/22 0350  WBC 7.9 6.3 5.6 11.1*  NEUTROABS 5.2  --   --   --   HGB 14.1 12.1 12.3 10.8*  HCT 43.8 36.8 37.4 32.9*  MCV 87.4 87.2 87.0 87.0  PLT 231 195 202 190   Basic Metabolic Panel: Recent Labs  Lab 12/19/22 1715 12/22/22 0351 12/23/22 0421 12/24/22 0350  NA 138 133* 136 140  K 4.2 3.6 3.9 4.2  CL 103 102 103 109  CO2 25 23 25  21*  GLUCOSE 142* 107* 104* 126*  BUN 15 12 15 12   CREATININE 0.94 0.77 0.84 1.01*  CALCIUM 9.3 8.7* 9.2 8.8*  MG  --  1.8  --   --    GFR: Estimated Creatinine Clearance: 55 mL/min (A) (by C-G formula based on SCr of 1.01 mg/dL (H)). Liver Function Tests: Recent Labs  Lab 12/19/22 1715 12/22/22 0351  AST 28 25  ALT 24 24  ALKPHOS 95 74  BILITOT 0.7 0.8  PROT 9.0* 6.9  ALBUMIN 4.1 3.0*   No results for input(s): "LIPASE", "AMYLASE" in the last 168 hours. No results for input(s): "AMMONIA" in the last 168 hours. Coagulation Profile: Recent Labs  Lab 12/19/22 1715  INR 0.9   Cardiac Enzymes: No results for input(s): "CKTOTAL", "CKMB", "CKMBINDEX", "TROPONINI" in the last 168 hours. BNP (last 3 results) No results for input(s): "PROBNP" in the last 8760 hours. HbA1C: No results for input(s): "HGBA1C" in the last 72 hours. CBG: Recent Labs  Lab 12/23/22 1213 12/23/22 1710 12/23/22 2124 12/24/22 0559 12/24/22 1204  GLUCAP 177* 165* 161* 115* 125*   Lipid Profile: No  results for input(s): "CHOL", "HDL", "LDLCALC", "TRIG", "CHOLHDL", "LDLDIRECT" in the last 72 hours. Thyroid Function Tests: No results for input(s): "TSH", "T4TOTAL", "FREET4", "T3FREE", "THYROIDAB" in the last 72 hours. Anemia Panel: No results for input(s): "VITAMINB12", "FOLATE", "FERRITIN", "TIBC", "IRON", "RETICCTPCT" in the last 72 hours. Sepsis Labs: No results for input(s): "PROCALCITON", "LATICACIDVEN" in the last 168 hours.  Recent Results (from the past 240 hour(s))  Surgical pcr screen     Status: None   Collection Time: 12/22/22  6:18 PM   Specimen: Nasal Mucosa; Nasal Swab  Result Value Ref Range Status   MRSA, PCR NEGATIVE NEGATIVE Final   Staphylococcus aureus NEGATIVE NEGATIVE Final    Comment: (NOTE) The Xpert SA Assay (FDA approved for NASAL specimens in patients 85 years of age and older), is one component of a comprehensive surveillance program. It is  not intended to diagnose infection nor to guide or monitor treatment. Performed at [facility], [address], [place], [place] [address]       Radiology Studies: DG C-Arm 1-60 Min-No Report  Result Date: 12/23/2022 Fluoroscopy was utilized by the requesting physician.  No radiographic interpretation.      Scheduled Meds:  aspirin EC  81 mg Oral Daily   atorvastatin  80 mg Oral Daily   clopidogrel  75 mg Oral Daily   docusate sodium  100 mg Oral Daily   enoxaparin (LOVENOX) injection  40 mg Subcutaneous Q24H   ezetimibe  10 mg Oral Daily   insulin aspart  0-15 Units Subcutaneous TID WC   insulin aspart  0-5 Units Subcutaneous QHS   pantoprazole  40 mg Oral Daily   Continuous Infusions:   LOS: 4 days   Time spent: 35 minutes   Noralee Stain, DO [facility] 12/24/2022, 1:38 PM   Available via [employer] secure chat 7am-7pm After these hours, please refer to coverage provider listed on [email or web address]

## 2022-12-24 NOTE — Care Management Important Message (Signed)
Important Message  Patient Details  Name: Deborah [name] MRN: [id number] Date of Birth: 1955/12/19   Important Message Given:  Yes - Medicare IM     Sherilyn Banker 12/24/2022, 2:04 PM

## 2022-12-24 NOTE — Progress Notes (Addendum)
Inpatient Rehab Admissions Coordinator:  Saw pt at bedside. She provided a number for Jamaia, one of her granddaughters. AC called number provided to verify dispo. Not able to leave a voicemail. Will continue to follow.  1600: Pt's granddaughter Janaija called AC. Explained CIR goals and expectations. Discussed average length, insurance authorization requirement, discharge home after completion of CIR. She acknowledged understanding. Also discussed the need to know pt's Medicaid insurance. Lia Foyer was unsure which insurance pt had and will follow up with pt. Lia Foyer confirmed that she will be able to provide 24/7 support for pt after discharge.   Wolfgang Phoenix, MS, CCC-SLP Admissions Coordinator [phone]

## 2022-12-25 DIAGNOSIS — I639 Cerebral infarction, unspecified: Secondary | ICD-10-CM | POA: Diagnosis not present

## 2022-12-25 LAB — GLUCOSE, CAPILLARY
Glucose-Capillary: 102 mg/dL — ABNORMAL HIGH (ref 70–99)
Glucose-Capillary: 117 mg/dL — ABNORMAL HIGH (ref 70–99)
Glucose-Capillary: 111 mg/dL — ABNORMAL HIGH (ref 70–99)
Glucose-Capillary: 89 mg/dL (ref 70–99)

## 2022-12-25 LAB — TSH: TSH: 10.728 u[IU]/mL — ABNORMAL HIGH (ref 0.350–4.500)

## 2022-12-25 LAB — T4, FREE: Free T4: 0.97 ng/dL (ref 0.61–1.12)

## 2022-12-25 NOTE — Progress Notes (Signed)
[facility]  Subjective  -states she had a little bit of trouble swallowing last night.   Objective (!) 95/57 (!) 52 98.2 F (36.8 C) (Oral) 19 94%  Intake/Output Summary (Last 24 hours) at 12/25/2022 0906 Last data filed at 12/24/2022 1300 Gross per 24 hour  Intake 240 ml  Output --  Net 240 ml    Right neck incision looks good. At her neurologic baseline with no new deficits  Laboratory Lab Results: Recent Labs    12/23/22 0421 12/24/22 0350  WBC 5.6 11.1*  HGB 12.3 10.8*  HCT 37.4 32.9*  PLT 202 190   BMET Recent Labs    12/23/22 0421 12/24/22 0350  NA 136 140  K 3.9 4.2  CL 103 109  CO2 25 21*  GLUCOSE 104* 126*  BUN 15 12  CREATININE 0.84 1.01*  CALCIUM 9.2 8.8*    COAG Lab Results  Component Value Date   INR 0.9 12/19/2022   INR 1.0 03/20/2020   No results found for: "PTT"  Assessment/Planning:  Postop day 2 status post right TCAR for symptomatic stenosis.  Awaiting CIR placement.  Aspirin statin Plavix from our standpoint.  Neurologic exam appears to be at baseline and overall looks good.  Cephus Shelling 12/25/2022 9:06 AM --

## 2022-12-25 NOTE — Progress Notes (Signed)
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  She was found to have acute right-sided CVA with right ICA stenosis.  Neurology was consulted.  Vascular surgery also evaluated patient due to right carotid artery stenosis.  Patient ultimately underwent TCAR 11/26 by Dr. Karin Lieu.   New events last 24 hours / Subjective: Patient sitting in recliner, ate breakfast this morning.  No new physical complaints.  Awaiting CIR placement.  Assessment & Plan:   Principal Problem:   Acute ischemic stroke [facility]) Active Problems:   Controlled type 2 diabetes mellitus with hyperglycemia (HCC)   NICM (nonischemic cardiomyopathy) (HCC)   History of cancer   CVA (cerebral vascular accident) (HCC)    Acute ischemic anterior circulation right brain CVA's -embolic versus watershed -Resultant left-sided numbness -MRI brain noted acute infarcts involving the right caudate, lentiform nucleus, right frontal, and right parietal cortex and white matter with no hemorrhage or mass effect as well as evidence of slow flow/occlusion of the extracranial left vertebral artery -CTA head and neck redemonstrated occlusion of the left vertebral artery just distal to its origin with unchanged 50% stenosis of the proximal right ICA at the bifurcation with no other intracranial large vessel occlusion -TTE notes EF 45-50% with global hypokinesis and mild concentric left ventricular hypertrophy and normal RV with no obvious intracardiac source of embolization -LDL 152 -A1c 6.9 -PT/OT recommending CIR placement -Aspirin, Plavix, Lipitor   Right carotid artery stenosis -Status  post right-sided Transcarotid artery revascularization - 10x62mm stent on 11/26 -Follow-up with vascular surgery in 1 month with carotid duplex with Dr. Karin Lieu   Chest pain with LBBB -Chest pain resolved.  Troponin negative   Remote history of breast cancer -Has been in remission for 11 years   Controlled DM2 -A1c 6.9 -Sliding scale insulin  HTN -Stable   HLD -Lipitor, Zetia     DVT prophylaxis:  enoxaparin (LOVENOX) injection 40 mg Start: 12/24/22 1000 SCD's Start: 12/23/22 1043  Code Status: Full code Family Communication: None at bedside Disposition Plan: CIR Status is: Inpatient Remains inpatient appropriate because: CIR placement is pending    Antimicrobials:  Anti-infectives (From admission, onward)    Start     Dose/Rate Route Frequency Ordered Stop   12/23/22 1400  ceFAZolin (ANCEF) IVPB 2g/100 mL premix        2 g 200 mL/hr over 30 Minutes Intravenous Every 8 hours 12/23/22 1043 12/23/22 2230   12/23/22 0600  ceFAZolin (ANCEF) IVPB 2g/100 mL premix       Note to Pharmacy: Send with pt to OR   2 g 200 mL/hr over 30 Minutes Intravenous On call 12/22/22 1501 12/23/22 0807        Objective: Vitals:   12/24/22 2017 12/25/22 0014 12/25/22 0400 12/25/22 0802  BP: (!) 95/51 113/68 (!) 97/55 (!) 95/57  Pulse: (!) 51 (!) 48 (!) 52   Resp: 19 17 18 19   Temp: 97.9 F (36.6 C) 98 F (36.7 C) 98 F (36.7 C) 98.2 F (36.8 C)  TempSrc: Oral Oral Oral Oral  SpO2: 96% 96% 96% 94%  Weight:      Height:  Intake/Output Summary (Last 24 hours) at 12/25/2022 1137 Last data filed at 12/24/2022 1300 Gross per 24 hour  Intake 240 ml  Output --  Net 240 ml   Filed Weights   12/19/22 2300 12/20/22 0400  Weight: 85.1 kg 85.1 kg    Examination:  General exam: Appears calm and comfortable  Respiratory system: Clear to auscultation. Respiratory effort normal. No respiratory distress. No conversational dyspnea.  Cardiovascular system: S1 & S2 heard,  bradycardic rate, regular rhythm. No murmurs. No pedal edema. Gastrointestinal system: Abdomen is nondistended, soft and nontender. Normal bowel sounds heard. Central nervous system: Alert and oriented. No focal neurological deficits. Speech clear.  Extremities: Symmetric in appearance  Skin: No rashes, lesions or ulcers on exposed skin  Psychiatry: Judgement and insight appear normal. Mood & affect appropriate.   Data Reviewed: I have personally reviewed following labs and imaging studies  CBC: Recent Labs  Lab 12/19/22 1715 12/22/22 0351 12/23/22 0421 12/24/22 0350  WBC 7.9 6.3 5.6 11.1*  NEUTROABS 5.2  --   --   --   HGB 14.1 12.1 12.3 10.8*  HCT 43.8 36.8 37.4 32.9*  MCV 87.4 87.2 87.0 87.0  PLT 231 195 202 190   Basic Metabolic Panel: Recent Labs  Lab 12/19/22 1715 12/22/22 0351 12/23/22 0421 12/24/22 0350  NA 138 133* 136 140  K 4.2 3.6 3.9 4.2  CL 103 102 103 109  CO2 25 23 25  21*  GLUCOSE 142* 107* 104* 126*  BUN 15 12 15 12   CREATININE 0.94 0.77 0.84 1.01*  CALCIUM 9.3 8.7* 9.2 8.8*  MG  --  1.8  --   --    GFR: Estimated Creatinine Clearance: 55 mL/min (A) (by C-G formula based on SCr of 1.01 mg/dL (H)). Liver Function Tests: Recent Labs  Lab 12/19/22 1715 12/22/22 0351  AST 28 25  ALT 24 24  ALKPHOS 95 74  BILITOT 0.7 0.8  PROT 9.0* 6.9  ALBUMIN 4.1 3.0*   No results for input(s): "LIPASE", "AMYLASE" in the last 168 hours. No results for input(s): "AMMONIA" in the last 168 hours. Coagulation Profile: Recent Labs  Lab 12/19/22 1715  INR 0.9   Cardiac Enzymes: No results for input(s): "CKTOTAL", "CKMB", "CKMBINDEX", "TROPONINI" in the last 168 hours. BNP (last 3 results) No results for input(s): "PROBNP" in the last 8760 hours. HbA1C: No results for input(s): "HGBA1C" in the last 72 hours. CBG: Recent Labs  Lab 12/24/22 0559 12/24/22 1204 12/24/22 1606 12/24/22 2140 12/25/22 0609  GLUCAP 115* 125* 79 148* 102*   Lipid Profile: No  results for input(s): "CHOL", "HDL", "LDLCALC", "TRIG", "CHOLHDL", "LDLDIRECT" in the last 72 hours. Thyroid Function Tests: No results for input(s): "TSH", "T4TOTAL", "FREET4", "T3FREE", "THYROIDAB" in the last 72 hours. Anemia Panel: No results for input(s): "VITAMINB12", "FOLATE", "FERRITIN", "TIBC", "IRON", "RETICCTPCT" in the last 72 hours. Sepsis Labs: No results for input(s): "PROCALCITON", "LATICACIDVEN" in the last 168 hours.  Recent Results (from the past 240 hour(s))  Surgical pcr screen     Status: None   Collection Time: 12/22/22  6:18 PM   Specimen: Nasal Mucosa; Nasal Swab  Result Value Ref Range Status   MRSA, PCR NEGATIVE NEGATIVE Final   Staphylococcus aureus NEGATIVE NEGATIVE Final    Comment: (NOTE) The Xpert SA Assay (FDA approved for NASAL specimens in patients 31 years of age and older), is one component of a comprehensive surveillance program. It is not intended to diagnose infection nor to guide or monitor treatment. Performed  at [facility], [address] [place], [place] [address]       Radiology Studies: No results found.    Scheduled Meds:  aspirin EC  81 mg Oral Daily   atorvastatin  80 mg Oral Daily   clopidogrel  75 mg Oral Daily   docusate sodium  100 mg Oral Daily   enoxaparin (LOVENOX) injection  40 mg Subcutaneous Q24H   ezetimibe  10 mg Oral Daily   insulin aspart  0-15 Units Subcutaneous TID WC   insulin aspart  0-5 Units Subcutaneous QHS   pantoprazole  40 mg Oral Daily   Continuous Infusions:   LOS: 5 days   Time spent: 20 minutes   Noralee Stain, DO [facility] 12/25/2022, 11:37 AM   Available via Epic secure chat 7am-7pm After these hours, please refer to coverage provider listed on [email or web address]

## 2022-12-25 NOTE — Plan of Care (Signed)

## 2022-12-26 DIAGNOSIS — I639 Cerebral infarction, unspecified: Secondary | ICD-10-CM | POA: Diagnosis not present

## 2022-12-26 LAB — GLUCOSE, CAPILLARY
Glucose-Capillary: 104 mg/dL — ABNORMAL HIGH (ref 70–99)
Glucose-Capillary: 95 mg/dL (ref 70–99)
Glucose-Capillary: 93 mg/dL (ref 70–99)
Glucose-Capillary: 104 mg/dL — ABNORMAL HIGH (ref 70–99)

## 2022-12-26 MED ORDER — LIDOCAINE 5 % EX PTCH
1.0000 | MEDICATED_PATCH | CUTANEOUS | Status: DC
  Administered 2022-12-26 – 2022-12-28 (×2): 1 via TRANSDERMAL
  Filled 2022-12-26 (×3): qty 1

## 2022-12-26 NOTE — Plan of Care (Signed)
Problem: Education: Goal: Knowledge of General Education information will improve Description: Including pain rating scale, medication(s)/side effects and non-pharmacologic comfort measures Outcome: Progressing   Problem: Health Behavior/Discharge Planning: Goal: Ability to manage health-related needs will improve Outcome: Progressing   Problem: Clinical Measurements: Goal: Ability to maintain clinical measurements within normal limits will improve Outcome: Progressing Goal: Will remain free from infection Outcome: Progressing Goal: Diagnostic test results will improve Outcome: Progressing Goal: Respiratory complications will improve Outcome: Progressing Goal: Cardiovascular complication will be avoided Outcome: Progressing   Problem: Activity: Goal: Risk for activity intolerance will decrease Outcome: Progressing   Problem: Nutrition: Goal: Adequate nutrition will be maintained Outcome: Progressing   Problem: Coping: Goal: Level of anxiety will decrease Outcome: Progressing   Problem: Elimination: Goal: Will not experience complications related to bowel motility Outcome: Progressing Goal: Will not experience complications related to urinary retention Outcome: Progressing   Problem: Pain Management: Goal: General experience of comfort will improve Outcome: Progressing   Problem: Safety: Goal: Ability to remain free from injury will improve Outcome: Progressing   Problem: Skin Integrity: Goal: Risk for impaired skin integrity will decrease Outcome: Progressing   Problem: Education: Goal: Ability to describe self-care measures that may prevent or decrease complications (Diabetes Survival Skills Education) will improve Outcome: Progressing Goal: Individualized Educational Video(s) Outcome: Progressing   Problem: Coping: Goal: Ability to adjust to condition or change in health will improve Outcome: Progressing   Problem: Fluid Volume: Goal: Ability to  maintain a balanced intake and output will improve Outcome: Progressing   Problem: Health Behavior/Discharge Planning: Goal: Ability to identify and utilize available resources and services will improve Outcome: Progressing Goal: Ability to manage health-related needs will improve Outcome: Progressing   Problem: Metabolic: Goal: Ability to maintain appropriate glucose levels will improve Outcome: Progressing   Problem: Nutritional: Goal: Maintenance of adequate nutrition will improve Outcome: Progressing Goal: Progress toward achieving an optimal weight will improve Outcome: Progressing   Problem: Skin Integrity: Goal: Risk for impaired skin integrity will decrease Outcome: Progressing   Problem: Tissue Perfusion: Goal: Adequacy of tissue perfusion will improve Outcome: Progressing   Problem: Education: Goal: Knowledge of disease or condition will improve Outcome: Progressing Goal: Knowledge of secondary prevention will improve (MUST DOCUMENT ALL) Outcome: Progressing Goal: Knowledge of patient specific risk factors will improve Deborah [name] N/A or DELETE if not current risk factor) Outcome: Progressing   Problem: Ischemic Stroke/TIA Tissue Perfusion: Goal: Complications of ischemic stroke/TIA will be minimized Outcome: Progressing   Problem: Coping: Goal: Will verbalize positive feelings about self Outcome: Progressing Goal: Will identify appropriate support needs Outcome: Progressing   Problem: Health Behavior/Discharge Planning: Goal: Ability to manage health-related needs will improve Outcome: Progressing Goal: Goals will be collaboratively established with patient/family Outcome: Progressing   Problem: Self-Care: Goal: Ability to participate in self-care as condition permits will improve Outcome: Progressing Goal: Verbalization of feelings and concerns over difficulty with self-care will improve Outcome: Progressing Goal: Ability to communicate needs accurately  will improve Outcome: Progressing   Problem: Nutrition: Goal: Risk of aspiration will decrease Outcome: Progressing Goal: Dietary intake will improve Outcome: Progressing   Problem: Education: Goal: Knowledge of disease or condition will improve Outcome: Progressing Goal: Knowledge of secondary prevention will improve (MUST DOCUMENT ALL) Outcome: Progressing Goal: Knowledge of patient specific risk factors will improve Deborah [name] N/A or DELETE if not current risk factor) Outcome: Progressing   Problem: Ischemic Stroke/TIA Tissue Perfusion: Goal: Complications of ischemic stroke/TIA will be minimized Outcome: Progressing   Problem: Coping: Goal: Will  verbalize positive feelings about self Outcome: Progressing Goal: Will identify appropriate support needs Outcome: Progressing   Problem: Health Behavior/Discharge Planning: Goal: Ability to manage health-related needs will improve Outcome: Progressing Goal: Goals will be collaboratively established with patient/family Outcome: Progressing

## 2022-12-26 NOTE — Plan of Care (Signed)
  Problem: Nutrition: Goal: Adequate nutrition will be maintained Outcome: Progressing   Problem: Elimination: Goal: Will not experience complications related to urinary retention Outcome: Progressing   

## 2022-12-26 NOTE — Progress Notes (Signed)
Physical Therapy Treatment Patient Details Name: Deborah [name] MRN: [id number] DOB: October 05, 1955 Today's Date: 12/26/2022   History of Present Illness Pt is 67 yo presenting [facility] with reports of L sided numbness that was present on awakening on 11/22. 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 Pt underwent rt TCAR on 12/23/22. 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    PT Comments  Pt is progressing towards goals. Currently pt is CGA for sit to stand without AD and Min A for gait with use of walls/rails and therapist for balance due to unsteady; wide based gait. Pt has decreased insight into deficits and despite max encouragement pt states she does not need AD with gait. Due to pt current functional status, home set up, available assistance at home and PLOF recommending skilled physical therapy services < 3 hours/day on discharge from acute care hospital setting in order to decrease risk for falls, injury and re-hospitalization through improving her balance, gait and strength. Pt tolerated treatment session well.     If plan is discharge home, recommend the following: A little help with walking and/or transfers;Assistance with cooking/housework;Assist for transportation;Supervision due to cognitive status     Equipment Recommendations  Other (comment) (defer to post acute)       Precautions / Restrictions Precautions Precautions: Fall Restrictions Weight Bearing Restrictions: No     Mobility  Bed Mobility Overal bed mobility: Modified Independent Bed Mobility: Supine to Sit, Sit to Supine     Supine to sit: Modified independent (Device/Increase time) Sit to supine: Modified independent (Device/Increase time)   General bed mobility comments: slight increase in time    Transfers Overall transfer level: Needs assistance Equipment used: None Transfers: Sit to/from Stand Sit to Stand: Contact guard assist            General transfer comment: unsteady with wide BOS on standing. Requires CGA for steadying assist.    Ambulation/Gait Ambulation/Gait assistance: Min assist Gait Distance (Feet): 400 Feet Assistive device:  (pt was using rails on the wall, assist from physical therapist) Gait Pattern/deviations: Step-through pattern, Decreased step length - left, Decreased dorsiflexion - left, Drifts right/left Gait velocity: decreased Gait velocity interpretation: 1.31 - 2.62 ft/sec, indicative of limited community ambulator   General Gait Details: Assist for balance and safety. Improved L scanning today.     Modified Rankin (Stroke Patients Only) Modified Rankin (Stroke Patients Only) Pre-Morbid Rankin Score: No symptoms Modified Rankin: Moderate disability     Balance Overall balance assessment: Needs assistance Sitting-balance support: No upper extremity supported, Feet supported Sitting balance-Leahy Scale: Fair     Standing balance support: Single extremity supported, No upper extremity supported, During functional activity Standing balance-Leahy Scale: Poor Standing balance comment: reliant on therapist or walls for balance.      Cognition Arousal: Alert Behavior During Therapy: WFL for tasks assessed/performed Overall Cognitive Status: Impaired/Different from baseline Area of Impairment: Safety/judgement       Following Commands: Follows multi-step commands with increased time Safety/Judgement: Decreased awareness of safety, Decreased awareness of deficits     General Comments: Pt L neglect has improved since initial evaluation but continues with poor insight into deficits declining AD despite multiple attempts at education.           General Comments General comments (skin integrity, edema, etc.): HR remained in the 80's throughout functional mobility      Pertinent Vitals/Pain Pain Assessment Pain Assessment: No/denies pain  PT Goals (current goals  can now be found in the care plan section) Acute Rehab PT Goals Patient Stated Goal: go home PT Goal Formulation: With patient Time For Goal Achievement: 01/03/23 Potential to Achieve Goals: Good Progress towards PT goals: Progressing toward goals    Frequency    Min 1X/week      PT Plan  Continue with current POC       AM-PAC PT "6 Clicks" Mobility   Outcome Measure  Help needed turning from your back to your side while in a flat bed without using bedrails?: None Help needed moving from lying on your back to sitting on the side of a flat bed without using bedrails?: None Help needed moving to and from a bed to a chair (including a wheelchair)?: A Little Help needed standing up from a chair using your arms (e.g., wheelchair or bedside chair)?: A Little Help needed to walk in hospital room?: A Little Help needed climbing 3-5 steps with a railing? : A Lot 6 Click Score: 19    End of Session Equipment Utilized During Treatment: Gait belt Activity Tolerance: Patient tolerated treatment well Patient left: in bed;with call bell/phone within reach Nurse Communication: Mobility status PT Visit Diagnosis: Other abnormalities of gait and mobility (R26.89);Hemiplegia and hemiparesis Hemiplegia - Right/Left: Left Hemiplegia - dominant/non-dominant: Non-dominant Hemiplegia - caused by: Cerebral infarction     Time: 1359-1414 PT Time Calculation (min) (ACUTE ONLY): 15 min  Charges:    $Therapeutic Activity: 8-22 mins PT General Charges $$ ACUTE PT VISIT: 1 Visit                    Harrel Carina, DPT, CLT  Acute Rehabilitation Services Office: [phone] (Secure chat preferred)    Claudia Desanctis 12/26/2022, 2:53 PM

## 2022-12-26 NOTE — Progress Notes (Signed)
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  She was found to have acute right-sided CVA with right ICA stenosis.  Neurology was consulted.  Vascular surgery also evaluated patient due to right carotid artery stenosis.  Patient ultimately underwent TCAR 11/26 by Dr. Karin Lieu.   New events last 24 hours / Subjective: Complaining of back pain, RN requested lidocaine patch earlier today.  No lightheadedness or dizziness.  Awaiting CIR placement.   Assessment & Plan:   Principal Problem:   Acute ischemic stroke [facility]) Active Problems:   Controlled type 2 diabetes mellitus with hyperglycemia (HCC)   NICM (nonischemic cardiomyopathy) (HCC)   History of cancer   CVA (cerebral vascular accident) (HCC)    Acute ischemic anterior circulation right brain CVA's -embolic versus watershed -Resultant left-sided numbness -MRI brain noted acute infarcts involving the right caudate, lentiform nucleus, right frontal, and right parietal cortex and white matter with no hemorrhage or mass effect as well as evidence of slow flow/occlusion of the extracranial left vertebral artery -CTA head and neck redemonstrated occlusion of the left vertebral artery just distal to its origin with unchanged 50% stenosis of the proximal right ICA at the bifurcation with no other intracranial large vessel occlusion -TTE notes EF 45-50% with global hypokinesis and mild concentric left ventricular hypertrophy and normal RV with no obvious intracardiac source of embolization -LDL 152 -A1c 6.9 -PT/OT recommending CIR placement -Aspirin, Plavix, Lipitor   Right carotid artery  stenosis -Status post right-sided Transcarotid artery revascularization - 10x62mm stent on 11/26 -Follow-up with vascular surgery in 1 month with carotid duplex with Dr. Karin Lieu   Chest pain with LBBB -Chest pain resolved.  Troponin negative   Remote history of breast cancer -Has been in remission for 11 years   Controlled DM2 -A1c 6.9 -Sliding scale insulin  HTN -Stable   HLD -Lipitor, Zetia     DVT prophylaxis:  enoxaparin (LOVENOX) injection 40 mg Start: 12/24/22 1000 SCD's Start: 12/23/22 1043  Code Status: Full code Family Communication: None at bedside Disposition Plan: CIR Status is: Inpatient Remains inpatient appropriate because: CIR placement is pending    Antimicrobials:  Anti-infectives (From admission, onward)    Start     Dose/Rate Route Frequency Ordered Stop   12/23/22 1400  ceFAZolin (ANCEF) IVPB 2g/100 mL premix        2 g 200 mL/hr over 30 Minutes Intravenous Every 8 hours 12/23/22 1043 12/23/22 2230   12/23/22 0600  ceFAZolin (ANCEF) IVPB 2g/100 mL premix       Note to Pharmacy: Send with pt to OR   2 g 200 mL/hr over 30 Minutes Intravenous On call 12/22/22 1501 12/23/22 0807        Objective: Vitals:   12/25/22 2000 12/25/22 2353 12/26/22 0321 12/26/22 0843  BP: (!) 118/90 105/61 132/67 (!) 109/96  Pulse: (!) 44 (!) 50 (!) 54 (!) 55  Resp:  12 17 16   Temp: 98.4 F (36.9 C) 98.4 F (36.9 C) 98.5 F (36.9 C) 98.5 F (36.9 C)  TempSrc: Oral Oral Oral Oral  SpO2: 98% 98% 94% 93%  Weight:  Height:       No intake or output data in the 24 hours ending 12/26/22 1312  Filed Weights   12/19/22 2300 12/20/22 0400  Weight: 85.1 kg 85.1 kg    Examination:  General exam: Appears calm and comfortable  Respiratory system: Clear to auscultation. Respiratory effort normal. No respiratory distress. No conversational dyspnea.  Cardiovascular system: S1 & S2 heard, bradycardic rate, regular rhythm rate 50-60. No murmurs. No pedal  edema. Gastrointestinal system: Abdomen is nondistended, soft and nontender. Normal bowel sounds heard. Central nervous system: Alert and oriented. No focal neurological deficits. Speech clear.  Extremities: Symmetric in appearance  Skin: No rashes, lesions or ulcers on exposed skin  Psychiatry: Judgement and insight appear normal. Mood & affect appropriate.   Data Reviewed: I have personally reviewed following labs and imaging studies  CBC: Recent Labs  Lab 12/19/22 1715 12/22/22 0351 12/23/22 0421 12/24/22 0350  WBC 7.9 6.3 5.6 11.1*  NEUTROABS 5.2  --   --   --   HGB 14.1 12.1 12.3 10.8*  HCT 43.8 36.8 37.4 32.9*  MCV 87.4 87.2 87.0 87.0  PLT 231 195 202 190   Basic Metabolic Panel: Recent Labs  Lab 12/19/22 1715 12/22/22 0351 12/23/22 0421 12/24/22 0350  NA 138 133* 136 140  K 4.2 3.6 3.9 4.2  CL 103 102 103 109  CO2 25 23 25  21*  GLUCOSE 142* 107* 104* 126*  BUN 15 12 15 12   CREATININE 0.94 0.77 0.84 1.01*  CALCIUM 9.3 8.7* 9.2 8.8*  MG  --  1.8  --   --    GFR: Estimated Creatinine Clearance: 55 mL/min (A) (by C-G formula based on SCr of 1.01 mg/dL (H)). Liver Function Tests: Recent Labs  Lab 12/19/22 1715 12/22/22 0351  AST 28 25  ALT 24 24  ALKPHOS 95 74  BILITOT 0.7 0.8  PROT 9.0* 6.9  ALBUMIN 4.1 3.0*   No results for input(s): "LIPASE", "AMYLASE" in the last 168 hours. No results for input(s): "AMMONIA" in the last 168 hours. Coagulation Profile: Recent Labs  Lab 12/19/22 1715  INR 0.9   Cardiac Enzymes: No results for input(s): "CKTOTAL", "CKMB", "CKMBINDEX", "TROPONINI" in the last 168 hours. BNP (last 3 results) No results for input(s): "PROBNP" in the last 8760 hours. HbA1C: No results for input(s): "HGBA1C" in the last 72 hours. CBG: Recent Labs  Lab 12/25/22 1141 12/25/22 1606 12/25/22 2048 12/26/22 0554 12/26/22 1236  GLUCAP 117* 111* 89 104* 95   Lipid Profile: No results for input(s): "CHOL", "HDL", "LDLCALC", "TRIG",  "CHOLHDL", "LDLDIRECT" in the last 72 hours. Thyroid Function Tests: Recent Labs    12/25/22 1315  TSH 10.728*  FREET4 0.97   Anemia Panel: No results for input(s): "VITAMINB12", "FOLATE", "FERRITIN", "TIBC", "IRON", "RETICCTPCT" in the last 72 hours. Sepsis Labs: No results for input(s): "PROCALCITON", "LATICACIDVEN" in the last 168 hours.  Recent Results (from the past 240 hour(s))  Surgical pcr screen     Status: None   Collection Time: 12/22/22  6:18 PM   Specimen: Nasal Mucosa; Nasal Swab  Result Value Ref Range Status   MRSA, PCR NEGATIVE NEGATIVE Final   Staphylococcus aureus NEGATIVE NEGATIVE Final    Comment: (NOTE) The Xpert SA Assay (FDA approved for NASAL specimens in patients 73 years of age and older), is one component of a comprehensive surveillance program. It is not intended to diagnose infection nor to guide or monitor treatment. Performed at [facility], [address], [place], [place] [address]       Radiology Studies: No results found.    Scheduled Meds:  aspirin EC  81 mg Oral Daily   atorvastatin  80 mg Oral Daily   clopidogrel  75 mg Oral Daily   docusate sodium  100 mg Oral Daily   enoxaparin (LOVENOX) injection  40 mg Subcutaneous Q24H   ezetimibe  10 mg Oral Daily   insulin aspart  0-15 Units Subcutaneous TID WC   insulin aspart  0-5 Units Subcutaneous QHS   lidocaine  1 patch Transdermal Q24H   pantoprazole  40 mg Oral Daily   Continuous Infusions:   LOS: 6 days   Time spent: 20 minutes   Noralee Stain, DO [facility] 12/26/2022, 1:12 PM   Available via Epic secure chat 7am-7pm After these hours, please refer to coverage provider listed on [email or web address]

## 2022-12-27 DIAGNOSIS — I639 Cerebral infarction, unspecified: Secondary | ICD-10-CM | POA: Diagnosis not present

## 2022-12-27 LAB — GLUCOSE, CAPILLARY
Glucose-Capillary: 91 mg/dL (ref 70–99)
Glucose-Capillary: 137 mg/dL — ABNORMAL HIGH (ref 70–99)
Glucose-Capillary: 105 mg/dL — ABNORMAL HIGH (ref 70–99)
Glucose-Capillary: 109 mg/dL — ABNORMAL HIGH (ref 70–99)

## 2022-12-27 NOTE — Plan of Care (Signed)
  Problem: Nutrition: Goal: Adequate nutrition will be maintained Outcome: Progressing   Problem: Elimination: Goal: Will not experience complications related to urinary retention Outcome: Progressing   Problem: Coping: Goal: Will verbalize positive feelings about self Outcome: Progressing

## 2022-12-27 NOTE — Progress Notes (Signed)
Mobility Specialist Progress Note:   12/27/22 1239  Mobility  Activity Ambulated with assistance in hallway  Level of Assistance Contact guard assist, steadying assist  Assistive Device None  Distance Ambulated (ft) 150 ft  Activity Response Tolerated well  Mobility Referral Yes  $Mobility charge 1 Mobility  Mobility Specialist Start Time (ACUTE ONLY) 1030  Mobility Specialist Stop Time (ACUTE ONLY) 1040  Mobility Specialist Time Calculation (min) (ACUTE ONLY) 10 min   Pre Mobility: 64 HR  During Mobility: 88 HR  Post Mobility: 55 HR   Pt received in bed, agreeable to mobility. Pt holding on to hand rails for stability during ambulation. No unsteadiness or LOB present. Asx throughout. VSS. Pt left in chair with call bell in reach and all needs met.   Leory Plowman  Mobility Specialist Please contact via [employer] office at [phone]

## 2022-12-27 NOTE — Progress Notes (Signed)
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  She was found to have acute right-sided CVA with right ICA stenosis.  Neurology was consulted.  Vascular surgery also evaluated patient due to right carotid artery stenosis.  Patient ultimately underwent TCAR 11/26 by Dr. Karin Lieu.   New events last 24 hours / Subjective: No new complaints, awaiting rehab placement  Assessment & Plan:   Principal Problem:   Acute ischemic stroke [facility]) Active Problems:   Controlled type 2 diabetes mellitus with hyperglycemia (HCC)   NICM (nonischemic cardiomyopathy) (HCC)   History of cancer   CVA (cerebral vascular accident) (HCC)    Acute ischemic anterior circulation right brain CVA's -embolic versus watershed -Resultant left-sided numbness -MRI brain noted acute infarcts involving the right caudate, lentiform nucleus, right frontal, and right parietal cortex and white matter with no hemorrhage or mass effect as well as evidence of slow flow/occlusion of the extracranial left vertebral artery -CTA head and neck redemonstrated occlusion of the left vertebral artery just distal to its origin with unchanged 50% stenosis of the proximal right ICA at the bifurcation with no other intracranial large vessel occlusion -TTE notes EF 45-50% with global hypokinesis and mild concentric left ventricular hypertrophy and normal RV with no obvious intracardiac source of embolization -LDL 152 -A1c 6.9 -PT/OT recommending CIR placement.  However discussed with CIR coordinator and patient does not have her Medicaid insurance information.  Asked TOC to look into SNF  placement. -Aspirin, Plavix, Lipitor   Right carotid artery stenosis -Status post right-sided Transcarotid artery revascularization - 10x36mm stent on 11/26 -Follow-up with vascular surgery in 1 month with carotid duplex with Dr. Karin Lieu   Chest pain with LBBB -Chest pain resolved.  Troponin negative   Remote history of breast cancer -Has been in remission for 11 years   Controlled DM2 -A1c 6.9 -Sliding scale insulin  HTN -Stable   HLD -Lipitor, Zetia     DVT prophylaxis:  enoxaparin (LOVENOX) injection 40 mg Start: 12/24/22 1000 SCD's Start: 12/23/22 1043  Code Status: Full code Family Communication: None at bedside Disposition Plan: CIR versus SNF Status is: Inpatient Remains inpatient appropriate because: CIR versus SNF placement is pending    Antimicrobials:  Anti-infectives (From admission, onward)    Start     Dose/Rate Route Frequency Ordered Stop   12/23/22 1400  ceFAZolin (ANCEF) IVPB 2g/100 mL premix        2 g 200 mL/hr over 30 Minutes Intravenous Every 8 hours 12/23/22 1043 12/23/22 2230   12/23/22 0600  ceFAZolin (ANCEF) IVPB 2g/100 mL premix       Note to Pharmacy: Send with pt to OR   2 g 200 mL/hr over 30 Minutes Intravenous On call 12/22/22 1501 12/23/22 0807        Objective: Vitals:   12/26/22 2326 12/27/22 0143 12/27/22 0330 12/27/22 1141  BP: 121/78 136/76 136/76 125/70  Pulse: 68 71 71 (!) 59  Resp: 17 13 13 15   Temp: 98.5 F (36.9 C) 98.8 F (37.1 C) 98.8 F (37.1 C) 99.1 F (37.3 C)  TempSrc: Oral  Oral   SpO2: 98%  95% 95%   Weight:      Height:        Intake/Output Summary (Last 24 hours) at 12/27/2022 1307 Last data filed at 12/26/2022 2256 Gross per 24 hour  Intake 0 ml  Output --  Net 0 ml    Filed Weights   12/19/22 2300 12/20/22 0400  Weight: 85.1 kg 85.1 kg    Examination:  General exam: Appears calm and comfortable  Respiratory system: Clear to auscultation. Respiratory effort normal. No respiratory  distress. No conversational dyspnea.  Cardiovascular system: S1 & S2 heard, bradycardic rate, regular rhythm rate 60s No murmurs. No pedal edema. Gastrointestinal system: Abdomen is nondistended, soft and nontender. Normal bowel sounds heard. Central nervous system: Alert and oriented. No focal neurological deficits. Speech clear.  Extremities: Symmetric in appearance  Skin: No rashes, lesions or ulcers on exposed skin  Psychiatry: Judgement and insight appear normal. Mood & affect appropriate.   Data Reviewed: I have personally reviewed following labs and imaging studies  CBC: Recent Labs  Lab 12/22/22 0351 12/23/22 0421 12/24/22 0350  WBC 6.3 5.6 11.1*  HGB 12.1 12.3 10.8*  HCT 36.8 37.4 32.9*  MCV 87.2 87.0 87.0  PLT 195 202 190   Basic Metabolic Panel: Recent Labs  Lab 12/22/22 0351 12/23/22 0421 12/24/22 0350  NA 133* 136 140  K 3.6 3.9 4.2  CL 102 103 109  CO2 23 25 21*  GLUCOSE 107* 104* 126*  BUN 12 15 12   CREATININE 0.77 0.84 1.01*  CALCIUM 8.7* 9.2 8.8*  MG 1.8  --   --    GFR: Estimated Creatinine Clearance: 55 mL/min (A) (by C-G formula based on SCr of 1.01 mg/dL (H)). Liver Function Tests: Recent Labs  Lab 12/22/22 0351  AST 25  ALT 24  ALKPHOS 74  BILITOT 0.8  PROT 6.9  ALBUMIN 3.0*   No results for input(s): "LIPASE", "AMYLASE" in the last 168 hours. No results for input(s): "AMMONIA" in the last 168 hours. Coagulation Profile: No results for input(s): "INR", "PROTIME" in the last 168 hours.  Cardiac Enzymes: No results for input(s): "CKTOTAL", "CKMB", "CKMBINDEX", "TROPONINI" in the last 168 hours. BNP (last 3 results) No results for input(s): "PROBNP" in the last 8760 hours. HbA1C: No results for input(s): "HGBA1C" in the last 72 hours. CBG: Recent Labs  Lab 12/26/22 1236 12/26/22 1601 12/26/22 2113 12/27/22 0613 12/27/22 1143  GLUCAP 95 93 104* 91 137*   Lipid Profile: No results for input(s): "CHOL", "HDL", "LDLCALC", "TRIG",  "CHOLHDL", "LDLDIRECT" in the last 72 hours. Thyroid Function Tests: Recent Labs    12/25/22 1315  TSH 10.728*  FREET4 0.97   Anemia Panel: No results for input(s): "VITAMINB12", "FOLATE", "FERRITIN", "TIBC", "IRON", "RETICCTPCT" in the last 72 hours. Sepsis Labs: No results for input(s): "PROCALCITON", "LATICACIDVEN" in the last 168 hours.  Recent Results (from the past 240 hour(s))  Surgical pcr screen     Status: None   Collection Time: 12/22/22  6:18 PM   Specimen: Nasal Mucosa; Nasal Swab  Result Value Ref Range Status   MRSA, PCR NEGATIVE NEGATIVE Final   Staphylococcus aureus NEGATIVE NEGATIVE Final    Comment: (NOTE) The Xpert SA Assay (FDA approved for NASAL specimens in patients 61 years of age and older), is one component of a comprehensive surveillance program. It is not intended to diagnose infection nor to guide or monitor treatment. Performed at [facility], [address], [place], [place] [address]  Radiology Studies: No results found.    Scheduled Meds:  aspirin EC  81 mg Oral Daily   atorvastatin  80 mg Oral Daily   clopidogrel  75 mg Oral Daily   docusate sodium  100 mg Oral Daily   enoxaparin (LOVENOX) injection  40 mg Subcutaneous Q24H   ezetimibe  10 mg Oral Daily   insulin aspart  0-15 Units Subcutaneous TID WC   insulin aspart  0-5 Units Subcutaneous QHS   lidocaine  1 patch Transdermal Q24H   pantoprazole  40 mg Oral Daily   Continuous Infusions:   LOS: 7 days   Time spent: 20 minutes   Noralee Stain, DO [facility] 12/27/2022, 1:07 PM   Available via Epic secure chat 7am-7pm After these hours, please refer to coverage provider listed on [email or web address]

## 2022-12-28 DIAGNOSIS — I639 Cerebral infarction, unspecified: Secondary | ICD-10-CM | POA: Diagnosis not present

## 2022-12-28 LAB — GLUCOSE, CAPILLARY
Glucose-Capillary: 108 mg/dL — ABNORMAL HIGH (ref 70–99)
Glucose-Capillary: 124 mg/dL — ABNORMAL HIGH (ref 70–99)

## 2022-12-28 MED ORDER — ATORVASTATIN CALCIUM 80 MG PO TABS: 80.0000 mg | ORAL_TABLET | Freq: Every day | ORAL | 2 refills | Status: AC

## 2022-12-28 NOTE — Discharge Summary (Signed)
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 Time: 12/22/22  6:18 PM   Specimen: Nasal Mucosa; Nasal Swab  Result Value Ref Range Status   MRSA, PCR NEGATIVE NEGATIVE Final   Staphylococcus aureus NEGATIVE NEGATIVE Final    Comment: (NOTE) The Xpert SA Assay (FDA approved for NASAL specimens in patients 81 years of age and older), is one component of a comprehensive surveillance program. It is not intended to diagnose infection nor to guide or monitor treatment. Performed at [facility], [address], [place], [place] [address]      Labs: BNP (last 3 results) No results for input(s): "BNP" in the last 8760 hours. Basic Metabolic Panel: Recent Labs  Lab 12/22/22 0351 12/23/22 0421 12/24/22 0350  NA 133* 136 140  K 3.6 3.9 4.2  CL 102 103 109  CO2 23 25 21*  GLUCOSE 107* 104* 126*  BUN 12 15 12   CREATININE 0.77 0.84 1.01*  CALCIUM 8.7* 9.2  8.8*  MG 1.8  --   --    Liver Function Tests: Recent Labs  Lab 12/22/22 0351  AST 25  ALT 24  ALKPHOS 74  BILITOT 0.8  PROT 6.9  ALBUMIN 3.0*   No results for input(s): "LIPASE", "AMYLASE" in the last 168 hours. No results for input(s): "AMMONIA" in the last 168 hours. CBC: Recent Labs  Lab 12/22/22 0351 12/23/22 0421 12/24/22 0350  WBC 6.3 5.6 11.1*  HGB 12.1 12.3 10.8*  HCT 36.8 37.4 32.9*  MCV 87.2 87.0 87.0  PLT 195 202 190   Cardiac Enzymes: No results for input(s): "CKTOTAL", "CKMB", "CKMBINDEX", "TROPONINI" in the last 168 hours. BNP: Invalid input(s): "POCBNP" CBG: Recent Labs  Lab 12/27/22 1143 12/27/22 1559 12/27/22 2111 12/28/22 0621 12/28/22 1138  GLUCAP 137* 105* 109* 108* 124*   D-Dimer No results for input(s): "DDIMER" in the last 72 hours. Hgb A1c No results for input(s): "HGBA1C" in the last 72 hours. Lipid Profile No results for input(s): "CHOL", "HDL", "LDLCALC", "TRIG", "CHOLHDL", "LDLDIRECT" in the last 72 hours. Thyroid function studies Recent Labs    12/25/22 1315  TSH 10.728*   Anemia work up No results for input(s): "VITAMINB12", "FOLATE", "FERRITIN", "TIBC", "IRON", "RETICCTPCT" in the last 72 hours. Urinalysis    Component Value Date/Time   COLORURINE STRAW (A) 03/20/2020 0102   APPEARANCEUR CLEAR 03/20/2020 0102   LABSPEC 1.017 03/20/2020 0102   PHURINE 8.0 03/20/2020 0102   GLUCOSEU NEGATIVE 03/20/2020 0102   HGBUR NEGATIVE 03/20/2020 0102   BILIRUBINUR NEGATIVE 03/20/2020 0102   KETONESUR NEGATIVE 03/20/2020 0102   PROTEINUR NEGATIVE 03/20/2020 0102   UROBILINOGEN 1.0 11/27/2012 2251   NITRITE NEGATIVE 03/20/2020 0102   LEUKOCYTESUR NEGATIVE 03/20/2020 0102   Sepsis Labs Recent Labs  Lab 12/22/22 0351 12/23/22 0421 12/24/22 0350  WBC 6.3 5.6 11.1*   Microbiology Recent Results (from the past 240 hour(s))  Surgical pcr screen     Status: None   Collection Time: 12/22/22  6:18 PM   Specimen: Nasal Mucosa;  Nasal Swab  Result Value Ref Range Status   MRSA, PCR NEGATIVE NEGATIVE Final   Staphylococcus aureus NEGATIVE NEGATIVE Final    Comment: (NOTE) The Xpert SA Assay (FDA approved for NASAL specimens in patients 67 years of age and older), is one component of a comprehensive surveillance program. It is not intended to  diagnose infection nor to guide or monitor treatment. Performed at [facility], [address], [place], [place] [address]      Patient was seen and examined on the day of discharge and was found to be in stable condition. Time coordinating discharge: 35 minutes including assessment and coordination of care, as well as examination of the patient.   SIGNED:  Noralee Stain, DO [facility] 12/28/2022, 12:48 PM

## 2022-12-28 NOTE — TOC Transition Note (Signed)
Transition of Care [facility]) - CM/SW Discharge Note   Patient Details  Name: Deborah [name] MRN: [id number] Date of Birth: December 28, 1955  Transition of Care [facility]) CM/SW Contact:  Ronny Bacon, RN Phone Number: 12/28/2022, 11:44 AM   Clinical Narrative:   Patient with possible discharge today. Secure message from provider requesting outpatient rehab referral to be arranged. Referral # [id number] to [facility] completed and information placed on AVS.    Final next level of care: Home/Self Care Barriers to Discharge: No Barriers Identified   Patient Goals and CMS Choice      Discharge Placement                         Discharge Plan and Services Additional resources added to the After Visit Summary for                                       Social Determinants of Health (SDOH) Interventions SDOH Screenings   Food Insecurity: No Food Insecurity (12/20/2022)  Housing: Low Risk  (12/20/2022)  Transportation Needs: No Transportation Needs (12/20/2022)  Utilities: Not At Risk (12/20/2022)  Depression (PHQ2-9): Low Risk  (12/20/2022)  Financial Resource Strain: Low Risk  (12/20/2022)  Social Connections: Moderately Integrated (12/20/2022)  Stress: No Stress Concern Present (12/20/2022)  Tobacco Use: Low Risk  (12/23/2022)  Health Literacy: Adequate Health Literacy (12/20/2022)     Readmission Risk Interventions     No data to display

## 2022-12-28 NOTE — Progress Notes (Signed)
Patient IVs removed, no drainage from any surgical access point noted. AVS reviewed and explained to patient. All meds discussed and review as well as up coming appointments. Patient wheel chaired out to the [employer].

## 2023-01-05 ENCOUNTER — Other Ambulatory Visit: Payer: Self-pay | Admitting: *Deleted

## 2023-01-05 DIAGNOSIS — I7774 Dissection of vertebral artery: Secondary | ICD-10-CM

## 2023-01-15 ENCOUNTER — Ambulatory Visit (HOSPITAL_COMMUNITY)
Admission: RE | Admit: 2023-01-15 | Discharge: 2023-01-15 | Disposition: A | Discharge status: Home / Self Care | Payer: 59 | Source: Ambulatory Visit | Attending: Vascular Surgery | Admitting: Vascular Surgery

## 2023-01-15 DIAGNOSIS — I7774 Dissection of vertebral artery: Secondary | ICD-10-CM | POA: Diagnosis present

## 2023-01-20 ENCOUNTER — Emergency Department (HOSPITAL_COMMUNITY)
Admission: EM | Admit: 2023-01-20 | Discharge: 2023-01-20 | Disposition: A | Discharge status: Home / Self Care | Payer: 59 | Attending: Emergency Medicine | Admitting: Emergency Medicine

## 2023-01-20 ENCOUNTER — Other Ambulatory Visit: Payer: Self-pay

## 2023-01-20 ENCOUNTER — Emergency Department (HOSPITAL_COMMUNITY): Payer: 59

## 2023-01-20 ENCOUNTER — Encounter (HOSPITAL_COMMUNITY): Payer: Self-pay

## 2023-01-20 DIAGNOSIS — Z7984 Long term (current) use of oral hypoglycemic drugs: Secondary | ICD-10-CM | POA: Diagnosis not present

## 2023-01-20 DIAGNOSIS — R197 Diarrhea, unspecified: Secondary | ICD-10-CM | POA: Insufficient documentation

## 2023-01-20 DIAGNOSIS — E119 Type 2 diabetes mellitus without complications: Secondary | ICD-10-CM | POA: Insufficient documentation

## 2023-01-20 DIAGNOSIS — Z20822 Contact with and (suspected) exposure to covid-19: Secondary | ICD-10-CM | POA: Insufficient documentation

## 2023-01-20 DIAGNOSIS — Z7982 Long term (current) use of aspirin: Secondary | ICD-10-CM | POA: Diagnosis not present

## 2023-01-20 DIAGNOSIS — I10 Essential (primary) hypertension: Secondary | ICD-10-CM | POA: Insufficient documentation

## 2023-01-20 DIAGNOSIS — J189 Pneumonia, unspecified organism: Secondary | ICD-10-CM

## 2023-01-20 DIAGNOSIS — R0602 Shortness of breath: Secondary | ICD-10-CM

## 2023-01-20 DIAGNOSIS — J181 Lobar pneumonia, unspecified organism: Secondary | ICD-10-CM | POA: Insufficient documentation

## 2023-01-20 LAB — COMPREHENSIVE METABOLIC PANEL
ALT: 34 U/L (ref 0–44)
AST: 34 U/L (ref 15–41)
Albumin: 3.5 g/dL (ref 3.5–5.0)
Alkaline Phosphatase: 97 U/L (ref 38–126)
Anion gap: 13 (ref 5–15)
BUN: 16 mg/dL (ref 8–23)
CO2: 23 mmol/L (ref 22–32)
Calcium: 9.6 mg/dL (ref 8.9–10.3)
Chloride: 108 mmol/L (ref 98–111)
Creatinine, Ser: 0.88 mg/dL (ref 0.44–1.00)
GFR, Estimated: >60 mL/min (ref >60)
Glucose, Bld: 117 mg/dL — ABNORMAL HIGH (ref 70–99)
Potassium: 3.9 mmol/L (ref 3.5–5.1)
Sodium: 144 mmol/L (ref 135–145)
Total Bilirubin: 0.8 mg/dL (ref <1.2)
Total Protein: 7.8 g/dL (ref 6.5–8.1)

## 2023-01-20 LAB — CBC
HCT: 40.6 % (ref 36.0–46.0)
Hemoglobin: 13.2 g/dL (ref 12.0–15.0)
MCH: 28.1 pg (ref 26.0–34.0)
MCHC: 32.5 g/dL (ref 30.0–36.0)
MCV: 86.6 fL (ref 80.0–100.0)
Platelets: 256 10*3/uL (ref 150–400)
RBC: 4.69 MIL/uL (ref 3.87–5.11)
RDW: 15 % (ref 11.5–15.5)
WBC: 10.5 10*3/uL (ref 4.0–10.5)
nRBC: 0 % (ref 0.0–0.2)

## 2023-01-20 LAB — RESP PANEL BY RT-PCR (RSV, FLU A&B, COVID)  RVPGX2
Influenza A by PCR: NEGATIVE
Influenza B by PCR: NEGATIVE
Resp Syncytial Virus by PCR: NEGATIVE
SARS Coronavirus 2 by RT PCR: NEGATIVE

## 2023-01-20 LAB — TROPONIN I (HIGH SENSITIVITY)
Troponin I (High Sensitivity): 10 ng/L (ref <18)
Troponin I (High Sensitivity): 10 ng/L (ref <18)

## 2023-01-20 LAB — BRAIN NATRIURETIC PEPTIDE: B Natriuretic Peptide: 401.8 pg/mL — ABNORMAL HIGH (ref 0.0–100.0)

## 2023-01-20 MED ORDER — IOHEXOL 350 MG/ML SOLN
75.0000 mL | Freq: Once | INTRAVENOUS | Status: AC | PRN
  Administered 2023-01-20: 75 mL via INTRAVENOUS

## 2023-01-20 MED ORDER — IPRATROPIUM-ALBUTEROL 0.5-2.5 (3) MG/3ML IN SOLN
3.0000 mL | Freq: Once | RESPIRATORY_TRACT | Status: AC
  Administered 2023-01-20: 3 mL via RESPIRATORY_TRACT
  Filled 2023-01-20: qty 3

## 2023-01-20 NOTE — ED Provider Notes (Signed)
[facility] DE[facility]MENT AT [facility] Provider Note   CSN: [id number] Arrival date & time: 01/20/23  1316     History  Chief Complaint  Patient presents with   Shortness of Breath    Deborah [name] is a 67 y.o. female with PMHx DM, GERD, HTN, stroke who presents to ED concerned for cough, rhinorrhea, SOB that has been developing over the past 1 week. Patient went to PCP 3 days ago and was prescribed steroids and ABX for PNA. Patient concerned because the SOB has not been resolving as she expected. Patient unsure which ABX she has been taking. Also states that she had an episode of diarrhea today which has since resolved. Also endorses chest pain with deep inspiration and fever of 102F yesterday that has since resolved.  Denies fever, chest pain, dyspnea, cough, nausea, vomiting, diarrhea, dysuria, hematuria, hematochezia.    Shortness of Breath      Home Medications Prior to Admission medications   Medication Sig Start Date End Date Taking? Authorizing Provider  acetaminophen (TYLENOL) 325 MG tablet Take 2 tablets (650 mg total) by mouth every 4 (four) hours as needed for mild pain (or temp > 37.5 C (99.5 F)). 03/28/20  Yes Olivencia-Simmons, Deforest Hoyles, NP  amoxicillin-clavulanate (AUGMENTIN) 500-125 MG tablet Take 500 mg by mouth in the morning and at bedtime. 01/16/23 01/26/23 Yes [provider]  aspirin EC 81 MG EC tablet Take 1 tablet (81 mg total) by mouth daily. Swallow whole. 03/28/20  Yes Olivencia-Simmons, Deforest Hoyles, NP  atorvastatin (LIPITOR) 80 MG tablet Take 1 tablet (80 mg total) by mouth daily. 12/29/22  Yes Noralee Stain, DO  clopidogrel (PLAVIX) 75 MG tablet Take 1 tablet (75 mg total) by mouth daily. 03/28/20  Yes Olivencia-Simmons, Deforest Hoyles, NP  DULoxetine (CYMBALTA) 30 MG capsule Take 30 mg by mouth 2 (two) times daily. 01/01/23 02/03/23 Yes [provider]  ezetimibe (ZETIA) 10 MG tablet Take 10 mg by mouth daily. 11/11/19  Yes  [provider]  gabapentin (NEURONTIN) 300 MG capsule Take 300 mg by mouth in the morning. 12/30/22  Yes [provider]  metFORMIN (GLUCOPHAGE) 500 MG tablet Take 500 mg by mouth 2 (two) times daily with a meal. 02/01/19  Yes [provider]  predniSONE (DELTASONE) 10 MG tablet Take 1 tablet by mouth daily. For 5 days. 01/16/23  Yes [provider]  Cholecalciferol 125 MCG (5000 UT) capsule Take 5,000 Units by mouth daily. Patient not taking: Reported on 01/20/2023    [provider]  cyanocobalamin (VITAMIN B12) 500 MCG tablet Take 500 mcg by mouth daily. Patient not taking: Reported on 01/20/2023 03/14/22 03/14/23  [provider]      Allergies    Levofloxacin, Lisinopril, Aleve [naproxen], Capsaicin-menthol, Ibuprofen, and Shellfish allergy    Review of Systems   Review of Systems  Respiratory:  Positive for shortness of breath.     Physical Exam Updated Vital Signs BP (!) 172/85   Pulse 64   Temp 98.4 F (36.9 C)   Resp (!) 23   Ht 5\' 1"  (1.549 m)   Wt 84.7 kg   SpO2 98%   BMI 35.28 kg/m  Physical Exam Vitals and nursing note reviewed.  Constitutional:      General: She is not in acute distress. HENT:     Head: Normocephalic and atraumatic.     Mouth/Throat:     Mouth: Mucous membranes are moist.     Pharynx: No oropharyngeal exudate or posterior oropharyngeal  erythema.  Eyes:     General: No scleral icterus.       Right eye: No discharge.        Left eye: No discharge.     Conjunctiva/sclera: Conjunctivae normal.  Cardiovascular:     Rate and Rhythm: Normal rate.     Pulses: Normal pulses.     Heart sounds: No murmur heard. Pulmonary:     Effort: Pulmonary effort is normal. No respiratory distress.     Breath sounds: Decreased breath sounds present. No wheezing, rhonchi or rales.     Comments: Decreased breath sounds in left lung fields. Abdominal:     Tenderness: There is no abdominal tenderness.   Musculoskeletal:     Right lower leg: No edema.     Left lower leg: No edema.  Skin:    General: Skin is warm and dry.     Findings: No rash.  Neurological:     General: No focal deficit present.     Mental Status: She is alert. Mental status is at baseline.  Psychiatric:        Mood and Affect: Mood normal.     ED Results / Procedures / Treatments   Labs (all labs ordered are listed, but only abnormal results are displayed) Labs Reviewed  COMPREHENSIVE METABOLIC PANEL - Abnormal; Notable for the following components:      Result Value   Glucose, Bld 117 (*)    All other components within normal limits  BRAIN NATRIURETIC PEPTIDE - Abnormal; Notable for the following components:   B Natriuretic Peptide 401.8 (*)    All other components within normal limits  RESP PANEL BY RT-PCR (RSV, FLU A&B, COVID)  RVPGX2  CBC  TROPONIN I (HIGH SENSITIVITY)  TROPONIN I (HIGH SENSITIVITY)    EKG EKG Interpretation Date/Time:  Tuesday January 20 2023 13:44:49 EST Ventricular Rate:  59 PR Interval:  138 QRS Duration:  138 QT Interval:  458 QTC Calculation: 453 R Axis:   85  Text Interpretation: Sinus bradycardia Possible Left atrial enlargement Left ventricular hypertrophy with QRS widening and repolarization abnormality ( Sokolow-Lyon , Romhilt-Estes ) Abnormal ECG When compared with ECG of 23-Dec-2022 11:41, PREVIOUS ECG IS PRESENT Confirmed by Virgina Norfolk [id number]) on 01/20/2023 1:53:45 PM  Radiology CT Angio Chest PE W/Cm &/Or Wo Cm Result Date: 01/20/2023 CLINICAL DATA:  Pulmonary embolism (PE) suspected, high prob. Shortness of breath EXAM: CT ANGIOGRAPHY CHEST WITH CONTRAST TECHNIQUE: Multidetector CT imaging of the chest was performed using the standard protocol during bolus administration of intravenous contrast. Multiplanar CT image reconstructions and MIPs were obtained to evaluate the vascular anatomy. RADIATION DOSE REDUCTION: This exam was performed according to the  departmental dose-optimization program which includes automated exposure control, adjustment of the mA and/or kV according to patient size and/or use of iterative reconstruction technique. CONTRAST:  75mL OMNIPAQUE IOHEXOL 350 MG/ML SOLN COMPARISON:  None Available. FINDINGS: Cardiovascular: No filling defects in the pulmonary arteries to suggest pulmonary emboli. Heart borderline in size. Aorta normal caliber. Aortic atherosclerosis. Mediastinum/Nodes: No mediastinal, hilar, or axillary adenopathy. Trachea and esophagus are unremarkable. Thyroid unremarkable. Lungs/Pleura: Bronchiectasis with band like scarring in the left upper lobe. Trace right pleural effusion with dependent atelectasis. No real change since prior study. Upper Abdomen: No acute findings Musculoskeletal: Chest wall soft tissues are unremarkable. No acute bony abnormality. Review of the MIP images confirms the above findings. IMPRESSION: No evidence of pulmonary embolus. Trace right pleural effusion with right base atelectasis. Aortic Atherosclerosis (ICD10-I70.0). Electronically  Signed   By: Charlett Nose M.D.   On: 01/20/2023 19:03   DG Chest Port 1 View Result Date: 01/20/2023 CLINICAL DATA:  Shortness of breath. EXAM: PORTABLE CHEST 1 VIEW COMPARISON:  01/16/2023. FINDINGS: Low lung volume. There are probable atelectatic changes in the left retrocardiac region. There is significant interval improvement in the previously seen opacity in the right lower lobe. Bilateral lung fields are clear. No acute consolidation or lung collapse. Bilateral costophrenic angles are clear. Stable cardio-mediastinal silhouette. No acute osseous abnormalities. The soft tissues are within normal limits. IMPRESSION: *Significant interval improvement in the opacity in the right lung lower lobe. No new acute cardiopulmonary abnormality. Electronically Signed   By: Jules Schick M.D.   On: 01/20/2023 15:12    Procedures Procedures    Medications Ordered in  ED Medications  ipratropium-albuterol (DUONEB) 0.5-2.5 (3) MG/3ML nebulizer solution 3 mL (3 mLs Nebulization Given 01/20/23 1435)  iohexol (OMNIPAQUE) 350 MG/ML injection 75 mL (75 mLs Intravenous Contrast Given 01/20/23 1836)    ED Course/ Medical Decision Making/ A&P                                 Medical Decision Making Amount and/or Complexity of Data Reviewed Labs: ordered. Radiology: ordered.  Risk Prescription drug management.   This patient presents to the ED for concern of shortness of breath, this involves an extensive number of treatment options, and is a complaint that carries with it a high risk of complications and morbidity.  The differential diagnosis includes Anxiety, Anaphylaxis/Angioedema, Aspirated FB, Arrhythmia, CHF, Asthma, COPD, PNA, COVID/Flu/RSV, STEMI, Tamponade, TPNX, DKA, Sepsis, Toxin   Co morbidities that complicate the patient evaluation  DM, GERD, HTN, stroke   Additional history obtained:  Dr. Maurine Minister PCP 12/2022 PCP visit: Prednisone 10 mg daily x 5 days + AUGMENTIN 500-125 mg BID for 10 days for possible PNA 11/2022 ECHO: 45-50% EF   Lab Tests:  I Ordered, and personally interpreted labs.  The pertinent results include:   - CMP: no concern for electrolyte abnormality; no concern for kidney/liver damage - BNP: 401 - Trop: within normal limits - CBC: No concern for anemia or leukocytosis - Resp panel: negative    Imaging Studies ordered:  I ordered imaging studies including  -chest xray: to assess for process contributing to patient's symptoms.  I independently visualized and interpreted imaging  I agree with the radiologist interpretation   Cardiac Monitoring: / EKG:  The patient was maintained on a cardiac monitor.  I personally viewed and interpreted the cardiac monitored which showed an underlying rhythm of: no change from prior EKG   Problem List / ED Course / Critical interventions / Medication management  Patient  presents to ED concern for cough, SOB, rhinorrhea that is been progressing over the past week. Denies COPD/Asthma Hx.  Patient has been taking her antibiotics for pneumonia prescribed by her PCP.  Per chart review appears that PCP prescribed Augmentin and steroids. Patient concerned because her SOB is not resolving like she thought it would. Physical exam with tachypnea.  Rest of physical exam unremarkable.  Patient afebrile with stable vitals. CMP reassuring.  Respiratory panel negative.  CBC without leukocytosis or anemia.  Troponin within normal limits.  BNP elevated at 401 - physical exam and imaging without concern for CHF exacerbation.  EKG without acute changes. Chest x-ray showing significant improvement from chest x-ray taken 4 days ago.  CTA chest  without concern for PE.  CTA is showing trace right-sided pleural effusion which I believe is residual from patient's recent pneumonia. Patient asking to go home.  Patient does still appear mildly tachypneic.  When I asked patient about her difficulty breathing, she states that she feels good and is ready to go home.  I recommended follow-up with her cardiologist and PCP.  Patient verbalized understanding of plan. Staffed with Dr. Dalene Seltzer. I have reviewed the patients home medicines and have made adjustments as needed Patient was given return precautions. Patient stable for discharge at this time. Patient verbalized understanding of plan.  DDx: These are considered less likely due to history of present illness and physical exam findings Aspirated FB: no history of choking Arrhythmia/STEMI: EKG with no acute changes Tamponade: xray and physical exam reassuring CHF: no physical exam findings TPNX: Lungs clear to auscultation bilaterally Sepsis: afebrile and other vital signs stable   Social Determinants of Health:  none         Final Clinical Impression(s) / ED Diagnoses Final diagnoses:  SOB (shortness of breath)  Pneumonia of  right lung due to infectious organism, unspecified part of lung    Rx / DC Orders ED Discharge Orders     None         Margarita Rana 01/20/23 2035    Alvira Monday, MD 01/21/23 [phone]

## 2023-01-20 NOTE — ED Triage Notes (Signed)
Pt sent by PCP for pneumonia in her right lung and SOBx3d. Pt is tachypneic. Pt c/o chills

## 2023-01-20 NOTE — Discharge Instructions (Addendum)
It was a pleasure caring for you today. Work up was reassuring. Please follow up with your cardiologist and primary care provider. Seek emergency care if experiencing any new or worsening symptoms.

## 2023-01-22 ENCOUNTER — Encounter: Payer: Self-pay | Admitting: Vascular Surgery

## 2023-01-22 ENCOUNTER — Ambulatory Visit (INDEPENDENT_AMBULATORY_CARE_PROVIDER_SITE_OTHER): Payer: 59 | Admitting: Physician Assistant

## 2023-01-22 VITALS — BP 154/84 | HR 71 | Temp 98.6°F | Ht 61.0 in | Wt 179.1 lb

## 2023-01-22 DIAGNOSIS — I63231 Cerebral infarction due to unspecified occlusion or stenosis of right carotid arteries: Secondary | ICD-10-CM

## 2023-01-22 NOTE — Progress Notes (Signed)
POST OPERATIVE OFFICE NOTE    CC:  F/u for surgery  HPI:  This is a 67 y.o. Deborah [name] who is s/p right TCAR on 12/23/22 by Dr. Karin Lieu.  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 She did well post operatively with no other neurological deficits.  She was discharged on Aspirin, Statin and Plavix.   Pt returns today for follow up.  Pt states overall she is doing okay. She reports some neck pain and left shoulder/ arm pain since her surgery. She says her left shoulder feels " like its broken". She is unsure if this was from the falls she had from her stroke but it has been hurting ever since. She also reports the neck pain is very bothersome. She has been taking tylenol which helps. Otherwise she denies any slurred speech, trouble swallowing, facial drooping, unilateral upper or lower extremity weakness or numbness. Her left sided weakness/ deficits overall she says are improved but she feels that intermittently she feels weaker on left side. She has been compliant with her Aspirin, statin and Plavix.    Allergies  Allergen Reactions   Levofloxacin Hives   Lisinopril Swelling    Swelling of face  Had facial swelling 10/06/13 which improved with OTC Benadryl.  Recurred 10/10/13, took OTC Benadryl, and was further evaluated at PCP office 10/11/13 for continued swelling of lips and face. She was treated with Kenalog in office and Benadryl and Medrol dosepak as outpatient.   Aleve [Naproxen]    Capsaicin-Menthol Hives   Ibuprofen Hives   Shellfish Allergy Hives    Current Outpatient Medications  Medication Sig Dispense Refill   acetaminophen (TYLENOL) 325 MG tablet Take 2 tablets (650 mg total) by mouth every 4 (four) hours as needed for mild pain (or temp > 37.5 C (99.5 F)). 30 tablet 0   amoxicillin-clavulanate (AUGMENTIN) 500-125 MG tablet Take 500 mg by mouth in the morning and at bedtime.     aspirin EC 81 MG EC tablet Take  1 tablet (81 mg total) by mouth daily. Swallow whole. 30 tablet 11   atorvastatin (LIPITOR) 80 MG tablet Take 1 tablet (80 mg total) by mouth daily. 30 tablet 2   Cholecalciferol 125 MCG (5000 UT) capsule Take 5,000 Units by mouth daily. (Patient not taking: Reported on 01/20/2023)     clopidogrel (PLAVIX) 75 MG tablet Take 1 tablet (75 mg total) by mouth daily. 15 tablet 0   cyanocobalamin (VITAMIN B12) 500 MCG tablet Take 500 mcg by mouth daily. (Patient not taking: Reported on 01/20/2023)     DULoxetine (CYMBALTA) 30 MG capsule Take 30 mg by mouth 2 (two) times daily.     ezetimibe (ZETIA) 10 MG tablet Take 10 mg by mouth daily.     gabapentin (NEURONTIN) 300 MG capsule Take 300 mg by mouth in the morning.     metFORMIN (GLUCOPHAGE) 500 MG tablet Take 500 mg by mouth 2 (two) times daily with a meal.     predniSONE (DELTASONE) 10 MG tablet Take 1 tablet by mouth daily. For 5 days.     No current facility-administered medications for this visit.     ROS:  See HPI  Physical Exam:  Vitals:   01/22/23 1413 01/22/23 1416  BP: (!) 143/87 (!) 154/84  Pulse: 71   Temp: 98.6 F (37 C)   SpO2: 96%     General: well appearing well nourished Lungs: non labored Cardiac: regular Incision:  right neck incision is well healed Extremities:  moving all extremities without deficits. 2+ radial and 2+ DP pulses bilaterally Neuro: alert and oriented, speech coherent   Non invasive Vascular lab: VAS US Carotid: Summary:  Right Carotid: Velocities in the right ICA are consistent with a 1-39% stenosis; patent CCA/ICA stent with no significant restenosis.   Left Carotid: Velocities in the left ICA are consistent with a 1-39% stenosis.   Vertebrals: Right vertebral artery demonstrates antegrade flow. Left vertebral  artery demonstrates retrograde flow.   Assessment/Plan:  This is a 67 y.o. Deborah [name] who is s/p:right TCAR on 12/23/22 by Dr. Karin Lieu.  This was for symptomatic right ICA stenosis with  right sided Stroke and left sided weakness with sensory deficits. Her incision is well healed. She is without any new neurological symptoms. She is overall back to her baseline. She is having some neck pain as well as left shoulder pain. She is scheduled to see her PCP on 01/30/23 and I advised her to discuss this with them. Her duplex today shows patent right ICA stent without any stenosis. Left ICA 1-39%. Vertebral's are stable. Left does have retrograde flow.  - continue Aspirin, Statin, Plavix - reviewed signs and symptoms of TIA/stoke and she understands should this occur to seek immediate medical attention -She will follow up in 9 months with repeat carotid duplex   Nathanial Rancher, [facility] [phone]   Clinic MD:  Karin Lieu

## 2023-02-03 ENCOUNTER — Other Ambulatory Visit: Payer: Self-pay

## 2023-02-03 DIAGNOSIS — I63231 Cerebral infarction due to unspecified occlusion or stenosis of right carotid arteries: Secondary | ICD-10-CM

## 2023-02-20 ENCOUNTER — Other Ambulatory Visit: Payer: Self-pay

## 2023-02-20 DIAGNOSIS — E785 Hyperlipidemia, unspecified: Secondary | ICD-10-CM | POA: Insufficient documentation

## 2023-02-23 ENCOUNTER — Telehealth: Payer: Self-pay

## 2023-02-23 NOTE — Telephone Encounter (Signed)
Dr. Pearlean Brownie and Baird Lyons, please see denial reason below.  Auth Submission: DENIED Site of care: Site of care: [facility] Payer: [employer] dual complete Medication & CPT/J Code(s) submitted: Leqvio (Inclisiran) [id number] Route of submission (phone, fax, portal): portal  Authorization has been DENIED because the patient must try and fail Repatha or Praluent before Leqvio can be approved.  Denial letter is in the media tab.

## 2023-02-26 NOTE — Telephone Encounter (Signed)
Upon further review Dr Roda Shutters had referred the pt to  Dr. Antonietta Barcelona at [facility]. The patient had a visit on 12/5.  I dont feel that Dr Pearlean Brownie ordering this medication would be appropriate. I believe pt PCP or neurologist should address.

## 2023-02-26 NOTE — Telephone Encounter (Signed)
Called the patient to discuss. There was no answer. Unable to LVM since pt has not yet been seen here.   She has not yet been seen in the office setting  and doesn't have an appointment scheduled to be seen. This medication may be better addressed by the patient's PCP or cardiologist since typically they would follow long term. I can place the order or I can have the pt when call back address with PCP. I didn't want to send the order without talking with the patient first. I am also checking with referral team to see if we have attempted to reach out and schedule the pt.

## 2023-09-11 ENCOUNTER — Encounter (HOSPITAL_BASED_OUTPATIENT_CLINIC_OR_DEPARTMENT_OTHER): Payer: Self-pay

## 2023-09-17 ENCOUNTER — Ambulatory Visit (HOSPITAL_BASED_OUTPATIENT_CLINIC_OR_DEPARTMENT_OTHER)
Admission: RE | Admit: 2023-09-17 | Discharge: 2023-09-17 | Disposition: A | Discharge status: Home / Self Care | Source: Ambulatory Visit | Attending: Vascular Surgery | Admitting: Vascular Surgery

## 2023-09-17 DIAGNOSIS — I63231 Cerebral infarction due to unspecified occlusion or stenosis of right carotid arteries: Secondary | ICD-10-CM | POA: Insufficient documentation

## 2023-11-30 ENCOUNTER — Emergency Department (HOSPITAL_BASED_OUTPATIENT_CLINIC_OR_DEPARTMENT_OTHER)
Admission: EM | Admit: 2023-11-30 | Discharge: 2023-11-30 | Disposition: A | Discharge status: Home / Self Care | Attending: Emergency Medicine | Admitting: Emergency Medicine

## 2023-11-30 ENCOUNTER — Other Ambulatory Visit: Payer: Self-pay

## 2023-11-30 ENCOUNTER — Encounter (HOSPITAL_BASED_OUTPATIENT_CLINIC_OR_DEPARTMENT_OTHER): Payer: Self-pay

## 2023-11-30 ENCOUNTER — Emergency Department (HOSPITAL_BASED_OUTPATIENT_CLINIC_OR_DEPARTMENT_OTHER)

## 2023-11-30 DIAGNOSIS — I110 Hypertensive heart disease with heart failure: Secondary | ICD-10-CM | POA: Diagnosis not present

## 2023-11-30 DIAGNOSIS — E119 Type 2 diabetes mellitus without complications: Secondary | ICD-10-CM | POA: Diagnosis not present

## 2023-11-30 DIAGNOSIS — Z859 Personal history of malignant neoplasm, unspecified: Secondary | ICD-10-CM | POA: Diagnosis not present

## 2023-11-30 DIAGNOSIS — R072 Precordial pain: Secondary | ICD-10-CM | POA: Diagnosis not present

## 2023-11-30 DIAGNOSIS — R0789 Other chest pain: Secondary | ICD-10-CM | POA: Diagnosis present

## 2023-11-30 DIAGNOSIS — I509 Heart failure, unspecified: Secondary | ICD-10-CM | POA: Insufficient documentation

## 2023-11-30 LAB — CBC
HCT: 37.4 % (ref 36.0–46.0)
Hemoglobin: 12.2 g/dL (ref 12.0–15.0)
MCH: 28.5 pg (ref 26.0–34.0)
MCHC: 32.6 g/dL (ref 30.0–36.0)
MCV: 87.4 fL (ref 80.0–100.0)
Platelets: 208 K/uL (ref 150–400)
RBC: 4.28 MIL/uL (ref 3.87–5.11)
RDW: 15.3 % (ref 11.5–15.5)
WBC: 7.1 K/uL (ref 4.0–10.5)
nRBC: 0 % (ref 0.0–0.2)

## 2023-11-30 LAB — TROPONIN T, HIGH SENSITIVITY
Troponin T High Sensitivity: <15 ng/L (ref 0–19)
Troponin T High Sensitivity: <15 ng/L (ref 0–19)

## 2023-11-30 LAB — COMPREHENSIVE METABOLIC PANEL WITH GFR
ALT: 24 U/L (ref 0–44)
AST: 25 U/L (ref 15–41)
Albumin: 4 g/dL (ref 3.5–5.0)
Alkaline Phosphatase: 115 U/L (ref 38–126)
Anion gap: 12 (ref 5–15)
BUN: 14 mg/dL (ref 8–23)
CO2: 25 mmol/L (ref 22–32)
Calcium: 9.2 mg/dL (ref 8.9–10.3)
Chloride: 105 mmol/L (ref 98–111)
Creatinine, Ser: 0.75 mg/dL (ref 0.44–1.00)
GFR, Estimated: >60 mL/min (ref >60)
Glucose, Bld: 129 mg/dL — ABNORMAL HIGH (ref 70–99)
Potassium: 3.9 mmol/L (ref 3.5–5.1)
Sodium: 141 mmol/L (ref 135–145)
Total Bilirubin: 0.4 mg/dL (ref 0.0–1.2)
Total Protein: 7.7 g/dL (ref 6.5–8.1)

## 2023-11-30 LAB — PRO BRAIN NATRIURETIC PEPTIDE: Pro Brain Natriuretic Peptide: 953 pg/mL — ABNORMAL HIGH (ref <300.0)

## 2023-11-30 MED ORDER — FUROSEMIDE 10 MG/ML IJ SOLN
20.0000 mg | Freq: Once | INTRAMUSCULAR | Status: AC
  Administered 2023-11-30: 20 mg via INTRAVENOUS
  Filled 2023-11-30: qty 2

## 2023-11-30 NOTE — ED Provider Notes (Signed)
 Emergency Department Provider Note   I have reviewed the triage vital signs and the nursing notes.   HISTORY  Chief Complaint Chest Pain   HPI Deborah [name] is a 68 y.o. female with PMH of DM, HTN, and CHF presents to the emergency department for evaluation of chest tightness.  Symptoms are worse with lying flat.  She has been compliant with her home medications.  Symptoms been developing over the past several days.  No fevers or chills.  No cough or congestion.  No worsening chest pain with exertion.  Denies abdominal pain, vomiting, diarrhea.   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   Diabetes mellitus (HCC)    A1C 6.5% on 03/20/20   GERD (gastroesophageal reflux disease)    Hypertension    Stroke [facility])     Review of Systems  Constitutional: No fever/chills Cardiovascular: Positive chest pain. Respiratory: Mild shortness of breath. Gastrointestinal: No abdominal pain.  No nausea, no vomiting. Skin: Negative for rash. Neurological: Negative for headaches.  ____________________________________________   PHYSICAL EXAM:  VITAL SIGNS: ED Triage Vitals  Encounter Vitals Group     BP 11/30/23 0857 (!) 162/104     Pulse Rate 11/30/23 0900 91     Resp 11/30/23 0856 20     Temp 11/30/23 0858 97.9 F (36.6 C)     Temp Source 11/30/23 0858 Oral     SpO2 11/30/23 0900 100 %   Constitutional: Alert and oriented. Well appearing and in no acute distress. Eyes: Conjunctivae are normal.  Head: Atraumatic. Nose: No congestion/rhinnorhea. Mouth/Throat: Mucous membranes are moist. Neck: No stridor. Cardiovascular: Normal rate, regular rhythm. Good peripheral circulation. Grossly normal heart sounds.   Respiratory: Normal respiratory effort.  No retractions. Lungs  CTAB. Gastrointestinal: Soft and nontender. No distention.  Musculoskeletal: No lower extremity tenderness nor edema. No gross deformities of extremities. Neurologic:  Normal speech and language.  Skin:  Skin is warm, dry and intact. No rash noted.   ____________________________________________   LABS (all labs ordered are listed, but only abnormal results are displayed)  Labs Reviewed  COMPREHENSIVE METABOLIC PANEL WITH GFR - Abnormal; Notable for the following components:      Result Value   Glucose, Bld 129 (*)    All other components within normal limits  PRO BRAIN NATRIURETIC PEPTIDE - Abnormal; Notable for the following components:   Pro Brain Natriuretic Peptide 953.0 (*)    All other components within normal limits  CBC  TROPONIN T, HIGH SENSITIVITY  TROPONIN T, HIGH SENSITIVITY   ____________________________________________  EKG   EKG Interpretation Date/Time:  Monday November 30 2023 08:55:00 EST Ventricular Rate:  74 PR Interval:  160 QRS Duration:  89 QT Interval:  367 QTC Calculation: 408 R Axis:   55  Text Interpretation: Sinus rhythm Atrial premature complexes Probable left atrial enlargement Probable LVH with secondary repol abnrm Similar to prior Confirmed by Darra Chew [phone]) on 11/30/2023 8:58:01 AM         ____________________________________________   PROCEDURES  Procedure(s) performed:   Procedures  None  ____________________________________________   INITIAL IMPRESSION / ASSESSMENT AND PLAN / ED COURSE  Pertinent labs & imaging results that were available during my care of the patient were reviewed by me and considered in my medical decision making (see chart for  details).   This patient is Presenting for Evaluation of CP, which does require a range of treatment options, and is a complaint that involves a high risk of morbidity and mortality.  The Differential Diagnoses includes but is not exclusive to acute coronary syndrome,  aortic dissection, pulmonary embolism, cardiac tamponade, community-acquired pneumonia, pericarditis, musculoskeletal chest wall pain, etc.   Critical Interventions-    Medications  furosemide (LASIX) injection 20 mg (20 mg Intravenous Given 11/30/23 1059)    Reassessment after intervention: symptoms improved.    I decided to review pertinent External Data, and in summary patient with EF 45-50% on ECHO from 2024.   Clinical Laboratory Tests Ordered, included BNP elevated to 900.  CBC without leukocytosis or anemia.  No acute kidney injury.  Troponin normal.  Radiologic Tests Ordered, included CXR. I independently interpreted the images and agree with radiology interpretation.   Cardiac Monitor Tracing which shows NSR.    Social Determinants of Health Risk patient is as non-smoker.   Medical Decision Making: Summary:  The patient presents the emergency department with chest tightness. Worse with laying flat. No fluid retention in the leg but ? Is this could be volume related.   Reevaluation with update and discussion with patient. Feeling improved with diuresis starting. Plan for continued diuresis at home with Cardiology follow up later this week. Strict ED return precautions discussed.   Considered admission but ACS workup is reassuring. No active CP. Stable for discharge with close follow up already in place.   Patient's presentation is most consistent with acute presentation with potential threat to life or bodily function.   Disposition: discharge  ____________________________________________  FINAL CLINICAL IMPRESSION(S) / ED DIAGNOSES  Final diagnoses:  Precordial chest pain    Note:  This document was prepared using Dragon voice recognition software and may include unintentional dictation errors.  Fonda Law, MD, [facility]    Daisy Lites, Fonda MATSU, MD 12/02/23 [phone]

## 2023-11-30 NOTE — ED Triage Notes (Signed)
 Pt presents with central chest pain  that radiates to left arm X 3 days. Also endorses SOB and fatigue. States I not able to do much of anything lately

## 2023-11-30 NOTE — ED Notes (Signed)
 Pt alert and oriented X 4 at the time of discharge. RR even and unlabored. No acute distress noted. Pt verbalized understanding of discharge instructions as discussed. Pt ambulatory to lobby at time of discharge.

## 2023-11-30 NOTE — Discharge Instructions (Signed)

## 2023-11-30 NOTE — ED Notes (Signed)
 Lab notified of pro-BNP add-on.

## 2023-12-01 NOTE — Progress Notes (Unsigned)
 Cardiology Office Note Date:  12/04/2023  ID:  Deborah [name], DOB [birth year], MRN [id number] PCP:  Marinda Rocky CROME, DO (Inactive)  Cardiologist: Joelle VEAR Ren Donley, MD  Chief Complaint  Patient presents with   Chest Pain      Problems Reported CP 8/25 ED for CP 5/25 and 11/25 NICM LHC 2023 w/o obstructive CAD TTE 11/24: 45-50%, mildly decreased LVSF, GH, mild LVH, mod MR CVA s/p right carotid artery stent 11/24 Vertebral artery dissection DM HA1c 7.9 5/25 HTN/HLD M: ASA81, AN80, CL75, EE10 LDL 152 11/24  Visits  01/2023: see at [facility] cardiology; no changes made    History of Present Illness: Deborah [name] is a 68 y.o. female who presents for new visit.   She is reporting having feeling of chest tightness and heaviness over the last 2 weeks.  It seems to be worse when she is laying flat in bed.  She has had some worsening dyspnea as well during this time with exertion.  She denies any PND or lower edema.  She reports having history of diastolic heart failure but has not been on any diuretics.  She checks her blood pressure and it has been 120s over 70s.  She feels like her skin breaks out easily because of the combination of aspirin  and Plavix .  She used to follow-up at [facility] but decided to move her care here given that she tends to be admitted here when hospitalized.  She denies any tobacco use and reports that her mother had MI in her 79s.  ROS: Please see the history of present illness. All other systems are reviewed and negative.   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   Diabetes mellitus (HCC)    A1C 6.5% on 03/20/20   GERD (gastroesophageal reflux disease)    Hypertension    Stroke [facility])     Past Surgical History:  Procedure Laterality Date    BREAST SURGERY     TRANSCAROTID ARTERY REVASCULARIZATION  Right 12/23/2022   Procedure: TRANSCAROTID ARTERY REVASCULARIZATION;  Surgeon: Lanis Fonda BRAVO, MD;  Location: [facility] OR;  Service: Vascular;  Laterality: Right;    Current Outpatient Medications  Medication Sig Dispense Refill   aspirin  EC 81 MG EC tablet Take 1 tablet (81 mg total) by mouth daily. Swallow whole. 30 tablet 11   atorvastatin  (LIPITOR) 80 MG tablet Take 1 tablet (80 mg total) by mouth daily. 30 tablet 2   Cholecalciferol 125 MCG (5000 UT) capsule Take 5,000 Units by mouth daily.     clopidogrel  (PLAVIX ) 75 MG tablet Take 1 tablet (75 mg total) by mouth daily. 15 tablet 0   DULoxetine (CYMBALTA) 30 MG capsule Take 30 mg by mouth 2 (two) times daily.     ezetimibe  (ZETIA ) 10 MG tablet Take 10 mg by mouth daily.     gabapentin  (NEURONTIN ) 300 MG capsule Take 300 mg by mouth in the morning.     metFORMIN (GLUCOPHAGE) 500 MG tablet Take 500 mg by mouth 2 (two) times daily with a meal.     acetaminophen  (TYLENOL ) 325 MG tablet Take 2 tablets (650 mg total) by mouth every 4 (four) hours as needed for mild pain (or temp > 37.5 C (99.5 F)). (Patient not taking: Reported on 12/04/2023) 30 tablet  0   No current facility-administered medications for this visit.    Allergies:   Levofloxacin, Lisinopril, Aleve [naproxen], Capsaicin-menthol, Ibuprofen, and Shellfish allergy   Social History:  see above  Family History:  see above  PHYSICAL EXAM: VS:  BP 138/70   Pulse (!) 54   Ht 5' 1 (1.549 m)   Wt 182 lb (82.6 kg)   BMI 34.39 kg/m  , BMI Body mass index is 34.39 kg/m. GEN: Well nourished, well developed, in no acute distress HEENT: normal Neck: no JVD, carotid bruits, or masses Cardiac: RRR; no murmurs, rubs, or gallops,no edema  Respiratory:  CTAB bilaterally, normal work of breathing GI: soft, nontender, nondistended, + BS Extremities: No LE edema Skin: warm and dry, no rash Neuro:  Strength and sensation are  intact  EKG: Sinus bradycardia, LVH  Recent Labs: Reviewed  Studies: Reviewed  ASSESSMENT AND PLAN: Deborah [name] is a 68 y.o. female who presents for new visit.  - Regarding her recurrent chest tightness and dyspnea, differential includes both ischemic chest pain and diastolic heart failure(given worse while laying flat).  For further evaluation, we will obtain a 2D echo and ETT Myoview.  We will also obtain BNP to evaluate for volume overload, though does not appear overloaded on exam. - I explained that we could come off the aspirin  in January since would have been a year since the stent. - We will check lipid panel, hemoglobin A1c, and Lpa.  - We will consider GLP-1, SGLT2, and PCSK9 during next visit pending lab results.  Signed, Joelle VEAR Ren Donley, MD  12/04/2023 2:56 PM    [facility]

## 2023-12-04 ENCOUNTER — Ambulatory Visit

## 2023-12-04 VITALS — BP 138/70 | HR 54 | Ht 61.0 in | Wt 182.0 lb

## 2023-12-04 DIAGNOSIS — I428 Other cardiomyopathies: Secondary | ICD-10-CM | POA: Diagnosis not present

## 2023-12-04 DIAGNOSIS — R0789 Other chest pain: Secondary | ICD-10-CM

## 2023-12-04 DIAGNOSIS — R0602 Shortness of breath: Secondary | ICD-10-CM

## 2023-12-04 DIAGNOSIS — I639 Cerebral infarction, unspecified: Secondary | ICD-10-CM | POA: Diagnosis not present

## 2023-12-04 DIAGNOSIS — I340 Nonrheumatic mitral (valve) insufficiency: Secondary | ICD-10-CM

## 2023-12-04 DIAGNOSIS — I10 Essential (primary) hypertension: Secondary | ICD-10-CM

## 2023-12-04 DIAGNOSIS — E1165 Type 2 diabetes mellitus with hyperglycemia: Secondary | ICD-10-CM

## 2023-12-04 LAB — LIPID PANEL

## 2023-12-04 NOTE — Patient Instructions (Signed)
 Medication Instructions:  Stop Aspirin  in January  *If you need a refill on your cardiac medications before your next appointment, please call your pharmacy*  Lab Work: Today- Lipids. A1c, Lipo A, BNP If you have labs (blood work) drawn today and your tests are completely normal, you will receive your results only by: MyChart Message (if you have MyChart) OR A paper copy in the mail If you have any lab test that is abnormal or we need to change your treatment, we will call you to review the results.  Testing/Procedures:  Myocardial Perfusion Imaging Study.  Please arrive 15 minutes prior to your appointment time for registration and insurance purposes.   The test will take approximately 3 to 4 hours to complete; you may bring reading material.  If someone comes with you to your appointment, they will need to remain in the main lobby due to limited space in the testing area. **If you are pregnant or breastfeeding, please notify the nuclear lab prior to your appointment**   How to prepare for your Myocardial Perfusion Test: Do not eat or drink 3 hours prior to your test, except you may have water. Do not consume products containing caffeine (regular or decaffeinated) 12 hours prior to your test. (ex: coffee, chocolate, sodas, tea). Do wear comfortable clothes (no dresses or overalls) and walking shoes, tennis shoes preferred (No heels or open toe shoes are allowed). Do NOT wear cologne, perfume, aftershave, or lotions (deodorant is allowed). If you use an inhaler, use it the AM of your test and bring it with you.  If you use a nebulizer, use it the AM of your test.  If these instructions are not followed, your test will have to be rescheduled.      Your physician has requested that you have an echocardiogram. Echocardiography is a painless test that uses sound waves to create images of your heart. It provides your doctor with information about the size and shape of your heart and how  well your heart's chambers and valves are working. This procedure takes approximately one hour. There are no restrictions for this procedure. Please do NOT wear cologne, perfume, aftershave, or lotions (deodorant is allowed). Please arrive 15 minutes prior to your appointment time.  Please note: We ask at that you not bring children with you during ultrasound (echo/ vascular) testing. Due to room size and safety concerns, children are not allowed in the ultrasound rooms during exams. Our front office staff cannot provide observation of children in our lobby area while testing is being conducted. An adult accompanying a patient to their appointment will only be allowed in the ultrasound room at the discretion of the ultrasound technician under special circumstances. We apologize for any inconvenience.   Follow-Up: At [facility], you and your health needs are our priority.  As part of our continuing mission to provide you with exceptional heart care, our providers are all part of one team.  This team includes your primary Cardiologist (physician) and Advanced Practice Providers or APPs (Physician Assistants and Nurse Practitioners) who all work together to provide you with the care you need, when you need it.  Your next appointment:   3 month(s)  Provider:   Joelle VEAR Ren Donley, MD

## 2023-12-06 LAB — LIPOPROTEIN A (LPA): Lipoprotein (a): 88.7 nmol/L — AB (ref <75.0)

## 2023-12-06 LAB — LIPID PANEL
Chol/HDL Ratio: 3.3 ratio (ref 0.0–4.4)
Cholesterol, Total: 189 mg/dL (ref 100–199)
HDL: 58 mg/dL (ref >39)
LDL Chol Calc (NIH): 110 mg/dL — ABNORMAL HIGH (ref 0–99)
Triglycerides: 121 mg/dL (ref 0–149)
VLDL Cholesterol Cal: 21 mg/dL (ref 5–40)

## 2023-12-06 LAB — HEMOGLOBIN A1C
Est. average glucose Bld gHb Est-mCnc: 154 mg/dL
Hgb A1c MFr Bld: 7 % — ABNORMAL HIGH (ref 4.8–5.6)

## 2023-12-06 LAB — BRAIN NATRIURETIC PEPTIDE: BNP: 105.3 pg/mL — AB (ref 0.0–100.0)

## 2023-12-07 ENCOUNTER — Ambulatory Visit: Payer: Self-pay

## 2023-12-07 DIAGNOSIS — I428 Other cardiomyopathies: Secondary | ICD-10-CM

## 2023-12-10 ENCOUNTER — Telehealth (HOSPITAL_COMMUNITY): Payer: Self-pay | Admitting: *Deleted

## 2023-12-10 ENCOUNTER — Telehealth (HOSPITAL_COMMUNITY): Payer: Self-pay

## 2023-12-10 NOTE — Telephone Encounter (Signed)
 Called pt to give instructions for stress test. No DPR on file. Left generic message for pt to call back for instructions.

## 2023-12-10 NOTE — Telephone Encounter (Signed)
 Pt returned call. Gave instructions for MPI

## 2023-12-14 ENCOUNTER — Other Ambulatory Visit: Payer: Self-pay

## 2023-12-14 DIAGNOSIS — R0789 Other chest pain: Secondary | ICD-10-CM

## 2023-12-17 ENCOUNTER — Ambulatory Visit (HOSPITAL_COMMUNITY)
Admission: RE | Admit: 2023-12-17 | Discharge: 2023-12-17 | Disposition: A | Discharge status: Home / Self Care | Source: Ambulatory Visit | Attending: Student in an Organized Health Care Education/Training Program | Admitting: Student in an Organized Health Care Education/Training Program

## 2023-12-17 DIAGNOSIS — R0789 Other chest pain: Secondary | ICD-10-CM | POA: Diagnosis present

## 2023-12-17 LAB — MYOCARDIAL PERFUSION IMAGING
LV dias vol: 122 mL (ref 46–106)
LV sys vol: 61 mL (ref 3.8–5.2)
Nuc Stress EF: 50 %
Peak HR: 103 {beats}/min
Rest HR: 55 {beats}/min
Rest Nuclear Isotope Dose: 9.7 mCi
SDS: 3
SRS: 2
SSS: 3
ST Depression (mm): 0 mm
Stress Nuclear Isotope Dose: 31.8 mCi
TID: 0.95

## 2023-12-17 MED ORDER — TECHNETIUM TC 99M TETROFOSMIN IV KIT
31.8000 | PACK | Freq: Once | INTRAVENOUS | Status: AC | PRN
  Administered 2023-12-17: 31.8 via INTRAVENOUS

## 2023-12-17 MED ORDER — TECHNETIUM TC 99M TETROFOSMIN IV KIT
9.7000 | PACK | Freq: Once | INTRAVENOUS | Status: AC | PRN
Start: 2023-12-17 — End: 2023-12-17
  Administered 2023-12-17: 9.7 via INTRAVENOUS

## 2023-12-17 MED ORDER — REGADENOSON 0.4 MG/5ML IV SOLN
0.4000 mg | Freq: Once | INTRAVENOUS | Status: AC
  Administered 2023-12-17: 0.4 mg via INTRAVENOUS

## 2023-12-17 MED ORDER — REGADENOSON 0.4 MG/5ML IV SOLN
INTRAVENOUS | Status: AC
  Filled 2023-12-17: qty 5

## 2023-12-18 MED ORDER — FUROSEMIDE 40 MG PO TABS: 40.0000 mg | ORAL_TABLET | Freq: Every day | ORAL | 0 refills | Status: AC

## 2023-12-18 NOTE — Telephone Encounter (Signed)
 I called Ms. Mctier to discuss her nuclear stress test.  There was no evidence of ischemia, but she does have evidence of nonischemic cardiomyopathy.  She has continued to have symptoms, and I wonder if this may all be heart failure related.  I had obtain BNP during her last visit but it was not very impressive.  Will trial 2 weeks of Lasix  40 mg daily and see if it helps.  Joelle DEL. Ren Ny, MD [facility]

## 2024-01-14 ENCOUNTER — Ambulatory Visit (HOSPITAL_COMMUNITY)

## 2024-01-14 DIAGNOSIS — R0602 Shortness of breath: Secondary | ICD-10-CM | POA: Insufficient documentation

## 2024-01-14 LAB — ECHOCARDIOGRAM COMPLETE
Area-P 1/2: 2.68 cm2
S' Lateral: 3.6 cm

## 2024-01-15 ENCOUNTER — Telehealth: Payer: Self-pay

## 2024-01-15 NOTE — Telephone Encounter (Signed)
 Patient states she was returning call. Please advise  ?

## 2024-02-23 NOTE — Progress Notes (Unsigned)
" °   °  °  Cardiology Office Note Date:  02/23/2024  ID:  Deborah [name], DOB [birth year], MRN [id number] PCP:  Marinda Rocky CROME, DO (Inactive)  Cardiologist:  Joelle VEAR Ren Donley, MD  No chief complaint on file.     Problems NICM LHC 2023 w/o obstructive CAD TTE 11/24: 45-50%, mildly decreased LVSF, GH, mild LVH, mod MR NST 11/25: Low risk, mild NICM, mild CAC in LAD TTE 12/25: 50-55%, MR/TR mild CVA s/p right carotid artery stent 11/24 Vertebral artery dissection M: JDJ18, AN80, CL75, EE10  Visits  01/2023: see at [facility] cardiology; no changes made 11/25: CP/dyspnea --> TTE, NST, BNP; plan to d/c ASA in January, LP, [place], Lpa 2/26: discuss GLP-1, SGLT2i and PCSK9i given LDL 110/HA1C 7.0     ROS: Otherwise negative Discussed the use of AI scribe software for clinical note transcription with the patient, who gave verbal consent to proceed.  History of Present Illness     Physical Exam VS:  There were no vitals taken for this visit. , BMI There is no height or weight on file to calculate BMI. GEN: Well nourished, well developed, in no acute distress HEENT: normal Neck: no JVD, carotid bruits, or masses Cardiac: ***RRR; no murmurs, rubs, or gallops,no edema  Respiratory:  CTAB bilaterally, normal work of breathing GI: soft, nontender, nondistended, + BS Extremities: No LE edema Skin: warm and dry, no rash Neuro:  Strength and sensation are intact  Recent Labs: Reviewed  Assessment and Plan Assessment & Plan      Signed, Joelle VEAR Ren Donley, MD  02/23/2024 1:35 PM    [facility] "

## 2024-02-26 ENCOUNTER — Telehealth: Payer: Self-pay

## 2024-02-26 ENCOUNTER — Ambulatory Visit

## 2024-02-26 VITALS — BP 152/82 | HR 94 | Ht 61.0 in | Wt 183.1 lb

## 2024-02-26 DIAGNOSIS — E785 Hyperlipidemia, unspecified: Secondary | ICD-10-CM | POA: Diagnosis not present

## 2024-02-26 DIAGNOSIS — I10 Essential (primary) hypertension: Secondary | ICD-10-CM | POA: Diagnosis not present

## 2024-02-26 DIAGNOSIS — R0602 Shortness of breath: Secondary | ICD-10-CM

## 2024-02-26 DIAGNOSIS — E1165 Type 2 diabetes mellitus with hyperglycemia: Secondary | ICD-10-CM

## 2024-02-26 DIAGNOSIS — I428 Other cardiomyopathies: Secondary | ICD-10-CM | POA: Diagnosis not present

## 2024-02-26 DIAGNOSIS — I639 Cerebral infarction, unspecified: Secondary | ICD-10-CM | POA: Diagnosis not present

## 2024-02-26 MED ORDER — EMPAGLIFLOZIN 10 MG PO TABS: 10.0000 mg | ORAL_TABLET | Freq: Every day | ORAL | 3 refills | Status: AC

## 2024-02-26 NOTE — Progress Notes (Signed)
 Patient ID: Deborah [name]                 DOB: April 05, 1955                    MRN: [id number]      HPI: Llewellyn Schoenberger is a 69 y.o. female patient referred to lipid clinic by Dr. Ren. PMH is significant for CAD CVA (s/p right carotid artery stent 11/2022), NICM, HLD, HTN, and T2DM.   Patient was seen by Dr. Ren today for CAD follow up appointment. Her cholesterol markers remain high despite current therapy. Patient referred to PharmD lipid clinic to discuss PCSK9i.   Patient presents today for PharmD lipid clinic. Patient is currently taking atorvastatin  80 mg daily and ezetimibe  10 mg daily for HLD management. She is tolerating both medications well without any side effects. Most recent LDL elevated (110) above goal of < 70. She mentioned that she is enrolled in DM GLP-1RA clinical trial and will reach out to see if she can take PCSK9i therapy.  We reviewed options for lowering LDL cholesterol, including PCSK-9 inhibitors.  Discussed mechanisms of action, dosing, side effects and potential decreases in LDL cholesterol.  Also reviewed cost information and potential options for patient assistance.   Current Medications: atorvastatin  80 mg daily and ezetimibe  10 mg daily  Intolerances: none Risk Factors: age, CAD CVA (s/p right carotid artery stent 11/2022), NICM, HTN, and T2DM.  LDL goal: < 70, ideally < 55  Lipid panel (11/2023): Chol 189, Trig 121, HDL 58, LDL 110 Liver enzymes (11/2023): ALT 24, AST 25, Alk phos 115 Lpa (11/2023): 88.7 nmol/L  Diet:  Patient states that she typically skips meals and when she gets hungry she tends to eat a lot of the wrong things. She eats a lot of baked chicken and salads, though she said she is putting too much dressing on salads  Exercise:  None   Family History:   Relation Problem Comments  Mother (Deceased) Breast cancer   Heart disease   Pancreatic cancer     Father Heart disease     Sister Breast cancer       Social History:   Alcohol: none  Smoking: none   Labs:  Lipid Panel     Component Value Date/Time   CHOL 189 12/04/2023 1551   TRIG 121 12/04/2023 1551   HDL 58 12/04/2023 1551   CHOLHDL 3.3 12/04/2023 1551   CHOLHDL 5.6 12/20/2022 0635   VLDL 17 12/20/2022 0635   LDLCALC 110 (H) 12/04/2023 1551   LABVLDL 21 12/04/2023 1551    Past Medical History:  Diagnosis Date   Cancer (HCC) 2013   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   Diabetes mellitus (HCC)    A1C 6.5% on 03/20/20   GERD (gastroesophageal reflux disease)    Hypertension    Stroke (HCC)     Medications Ordered Prior to Encounter[1]  Allergies[2]  Assessment/Plan:  1. Hyperlipidemia -  Problem  Hyperlipidemia   Hyperlipidemia Assessment:  LDL goal: < 70, ideally < 55 mg/dl; last LDLc 889  mg/dl (88/7974); liver enzymes wnl Lpa (11/2023): 88.7 nmol/L Tolerates Zetia  and high intensity statin well without any side effects  Discussed next potential options (PCSK-9 inhibitors); cost, dosing efficacy, side effects  Reemphasized importance of heart healthy diet; Provided and discussed Planning Healthy Meals handout to encourage  DM diet Encouraged her to begin walking 20-30 minutes 3 times per week Patient to discuss with trial and and ensure she can begin PCSK9i  Plan: Continue taking atorvastatin  80 mg daily and ezetimibe  10 mg daily  Will apply for PA for PCSK9i; will inform patient upon approval (prefers MyChart message) Lipid lab and Lpa due in 3 months after starting PCSK9i    Thank you,  Alekzander Cardell E. Mechell Girgis, Pharm.D, CPP [facility] [address], [place], [place] [address] Phone: [phone]; Fax: [phone]        [1]  Current Outpatient Medications on File Prior to Visit  Medication Sig Dispense Refill    atorvastatin  (LIPITOR) 80 MG tablet Take 1 tablet (80 mg total) by mouth daily. 30 tablet 2   Cholecalciferol 125 MCG (5000 UT) capsule Take 5,000 Units by mouth daily.     clopidogrel  (PLAVIX ) 75 MG tablet Take 1 tablet (75 mg total) by mouth daily. 15 tablet 0   Cyanocobalamin 500 MCG SUBL Take 1 tablet by mouth daily.     DULoxetine (CYMBALTA) 30 MG capsule Take 30 mg by mouth 2 (two) times daily. (Patient not taking: Reported on 02/26/2024)     empagliflozin  (JARDIANCE ) 10 MG TABS tablet Take 1 tablet (10 mg total) by mouth daily before breakfast. 90 tablet 3   ezetimibe  (ZETIA ) 10 MG tablet Take 10 mg by mouth daily.     furosemide  (LASIX ) 40 MG tablet Take 1 tablet (40 mg total) by mouth daily. 14 tablet 0   gabapentin  (NEURONTIN ) 300 MG capsule Take 300 mg by mouth in the morning.     losartan (COZAAR) 100 MG tablet Take 100 mg by mouth daily.     metFORMIN (GLUCOPHAGE) 500 MG tablet Take 500 mg by mouth 2 (two) times daily with a meal.     metoprolol  succinate (TOPROL -XL) 100 MG 24 hr tablet Take 100 mg by mouth daily.     spironolactone (ALDACTONE) 25 MG tablet Take 25 mg by mouth daily.     No current facility-administered medications on file prior to visit.  [2]  Allergies Allergen Reactions   Levofloxacin Hives   Lisinopril Swelling    Swelling of face  Had facial swelling 10/06/13 which improved with OTC Benadryl.  Recurred 10/10/13, took OTC Benadryl, and was further evaluated at PCP office 10/11/13 for continued swelling of lips and face. She was treated with Kenalog in office and Benadryl and Medrol dosepak as outpatient.   Aleve [Naproxen]    Capsaicin-Menthol Hives   Ibuprofen Hives   Shellfish Allergy Hives

## 2024-02-26 NOTE — Patient Instructions (Signed)
 Medication Instructions:  STOP ASPIRIN   START JARDIANCE  10 MG DAILY *If you need a refill on your cardiac medications before your next appointment, please call your pharmacy*  Lab Work: NO LABS If you have labs (blood work) drawn today and your tests are completely normal, you will receive your results only by: MyChart Message (if you have MyChart) OR A paper copy in the mail If you have any lab test that is abnormal or we need to change your treatment, we will call you to review the results.  Testing/Procedures: NO TESTING  Follow-Up: At [facility], you and your health needs are our priority.  As part of our continuing mission to provide you with exceptional heart care, our providers are all part of one team.  This team includes your primary Cardiologist (physician) and Advanced Practice Providers or APPs (Physician Assistants and Nurse Practitioners) who all work together to provide you with the care you need, when you need it.  Your next appointment:   6 month(s)  Provider:   Joelle VEAR Ren Donley, MD    Other Instructions CHECK BLOOD PRESSURE DAILY AND KEEP LOG OF READINGS  You have been referred to [facility] TO START PCSK9- REPATHA

## 2024-02-26 NOTE — Patient Instructions (Addendum)
 Your Results:             Your most recent labs Goal  Total Cholesterol 189 < 200  Triglycerides 121 < 150  HDL (happy/good cholesterol) 58 > 40  LDL (lousy/bad cholesterol 110 < 70, ideally < 55   Medication changes: Continue atorvastatin  80 mg daily and ezetimibe  10 mg daily  We will start the process to get Repatha or Praluent covered by your insurance.  Once the prior authorization is complete, we will call you to let you know and confirm pharmacy information. Lab orders:We want to repeat labs 3 months after starting PCSK9i.  We will send you a lab order to remind you once we             get closer to that time.    Deborah [name] E. Lallie Strahm, Pharm.D, CPP [place] [facility]. [facility] [address], [place], [place] [address] Phone: [phone] Fax: [phone]     Praluent is a cholesterol medication that improved your body's ability to get rid of bad cholesterol known as LDL. It can lower your LDL up to 60%. It is an injection that is given under the skin every 2 weeks. The most common side effects of Praluent include runny nose, symptoms of the common cold, rarely flu or flu-like symptoms, back/muscle pain in about 3-4% of the patients, and redness, pain, or bruising at the injection site.    Repatha is a cholesterol medication that improved your body's ability to get rid of bad cholesterol known as LDL. It can lower your LDL up to 60%! It is an injection that is given under the skin every 2 weeks. The medication often requires a prior authorization from your insurance company. We will take care of submitting all the necessary information to your insurance company to get it approved. The most common side effects of Repatha include runny nose, symptoms of the common cold, rarely flu or flu-like symptoms, back/muscle pain in about 3-4% of the patients, and redness, pain, or bruising at the injection site.    It is also recommended that patients with  high cholesterol adhere to a heart healthy diet, get regular exercise, avoid use of tobacco products, and maintain a healthy weight. Steps that you can take to help in these areas:  Limit consumption of trans fats, saturated fats, and cholesterol in your diet  Increase intake of lean meats such as chicken, turkey, and fish  Increase intake of foods rich in fiber such as fresh fruits, vegetables, beans and oatmeal Exercise as you are able; even 30 minutes of walking daily can aid in increasing heart health      Copay Assistance:  The [employer] foundation offers assistance to help pay for medication copays.  They will cover copays for all cholesterol lowering meds, including statins, fibrates, omega-3 oils, ezetimibe , Repatha, Praluent, Nexletol, Nexlizet.  The cards are usually good for $2,500 or 12 months, whichever comes first. Go to [employer]foundation.org Click on Apply Now Answer questions as to whom is applying (patient or representative) Your disease fund will be hypercholesterolemia - Medicare access Select the cholesterol medication you need assistance with (Repatha, Praluent, Nexlizet...) They will ask question about qualifying diagnosis - you can mark yes; and do you have insurance coverage.   When they ask what type of assistance you are interested in - copay assistance When you submit, the approval is usually within minutes.  You will need to print the card  information from the site You will need to show this information to your pharmacy, they will bill your Medicare Part D plan first -then bill [employer] --for the copay.   You can also call them at [phone], although the hold times can be quite long.

## 2024-02-26 NOTE — Assessment & Plan Note (Addendum)
 Assessment:  LDL goal: < 70, ideally < 55 mg/dl; last LDLc 889  mg/dl (88/7974); liver enzymes wnl Lpa (11/2023): 88.7 nmol/L Tolerates Zetia  and high intensity statin well without any side effects  Discussed next potential options (PCSK-9 inhibitors); cost, dosing efficacy, side effects  Reemphasized importance of heart healthy diet; Provided and discussed Planning Healthy Meals handout to encourage DM diet Encouraged her to begin walking 20-30 minutes 3 times per week Patient to discuss with trial and and ensure she can begin PCSK9i  Plan: Continue taking atorvastatin  80 mg daily and ezetimibe  10 mg daily  Will apply for PA for PCSK9i; will inform patient upon approval (prefers MyChart message) Lipid lab and Lpa due in 3 months after starting PCSK9i

## 2024-02-29 ENCOUNTER — Telehealth: Payer: Self-pay | Admitting: Pharmacy Technician

## 2024-02-29 ENCOUNTER — Other Ambulatory Visit (HOSPITAL_COMMUNITY): Payer: Self-pay

## 2024-02-29 NOTE — Telephone Encounter (Signed)
 Called patient to discuss Repatha copay. She would like additional time to think about proceeding with therapy. She stated she will call me back with decision.

## 2024-02-29 NOTE — Telephone Encounter (Signed)
 See other encounter.

## 2024-03-03 ENCOUNTER — Ambulatory Visit
